# Patient Record
Sex: Female | Born: 1935
Health system: Southern US, Community
[De-identification: ages and names within clinical notes are randomized; demographics above are authoritative.]

## PROBLEM LIST (undated history)

## (undated) DIAGNOSIS — I10 Essential (primary) hypertension: Secondary | ICD-10-CM

## (undated) DIAGNOSIS — I4891 Unspecified atrial fibrillation: Secondary | ICD-10-CM

## (undated) DIAGNOSIS — I509 Heart failure, unspecified: Secondary | ICD-10-CM

## (undated) DIAGNOSIS — E785 Hyperlipidemia, unspecified: Secondary | ICD-10-CM

## (undated) DIAGNOSIS — E119 Type 2 diabetes mellitus without complications: Secondary | ICD-10-CM

## (undated) HISTORY — DX: Heart failure, unspecified: I50.9

## (undated) HISTORY — DX: Type 2 diabetes mellitus without complications: E11.9

## (undated) HISTORY — PX: PARTIAL HYSTERECTOMY: SHX80

## (undated) HISTORY — PX: VESICOVAGINAL FISTULA CLOSURE W/ TAH: SUR271

## (undated) HISTORY — DX: Hyperlipidemia, unspecified: E78.5

## (undated) HISTORY — DX: Essential (primary) hypertension: I10

## (undated) HISTORY — PX: REPLACEMENT TOTAL KNEE: SUR1224

## (undated) HISTORY — PX: BACK SURGERY: SHX140

## (undated) HISTORY — PX: APPENDECTOMY: SHX54

---

## 1998-10-06 ENCOUNTER — Other Ambulatory Visit: Admission: RE | Admit: 1998-10-06 | Discharge: 1998-10-06 | Payer: Self-pay | Admitting: Gynecology

## 1999-01-30 ENCOUNTER — Encounter: Payer: Self-pay | Admitting: Gynecology

## 1999-01-30 ENCOUNTER — Encounter: Admission: RE | Admit: 1999-01-30 | Discharge: 1999-01-30 | Payer: Self-pay | Admitting: Gynecology

## 1999-09-25 ENCOUNTER — Other Ambulatory Visit: Admission: RE | Admit: 1999-09-25 | Discharge: 1999-09-25 | Payer: Self-pay | Admitting: Gynecology

## 1999-10-26 ENCOUNTER — Encounter: Admission: RE | Admit: 1999-10-26 | Discharge: 1999-10-26 | Payer: Self-pay | Admitting: Gynecology

## 1999-10-26 ENCOUNTER — Encounter: Payer: Self-pay | Admitting: Gynecology

## 1999-10-30 ENCOUNTER — Encounter (INDEPENDENT_AMBULATORY_CARE_PROVIDER_SITE_OTHER): Payer: Self-pay | Admitting: Specialist

## 1999-10-30 ENCOUNTER — Ambulatory Visit (HOSPITAL_COMMUNITY): Admission: RE | Admit: 1999-10-30 | Discharge: 1999-10-30 | Payer: Self-pay | Admitting: Gastroenterology

## 1999-11-01 ENCOUNTER — Inpatient Hospital Stay (HOSPITAL_COMMUNITY): Admission: EM | Admit: 1999-11-01 | Discharge: 1999-11-03 | Payer: Self-pay | Admitting: Emergency Medicine

## 2000-06-11 ENCOUNTER — Encounter: Admission: RE | Admit: 2000-06-11 | Discharge: 2000-06-11 | Payer: Self-pay | Admitting: Orthopedic Surgery

## 2000-06-11 ENCOUNTER — Encounter: Payer: Self-pay | Admitting: Orthopedic Surgery

## 2000-09-25 ENCOUNTER — Other Ambulatory Visit: Admission: RE | Admit: 2000-09-25 | Discharge: 2000-09-25 | Payer: Self-pay | Admitting: Gynecology

## 2000-09-26 ENCOUNTER — Encounter: Payer: Self-pay | Admitting: Gynecology

## 2000-09-26 ENCOUNTER — Encounter: Admission: RE | Admit: 2000-09-26 | Discharge: 2000-09-26 | Payer: Self-pay | Admitting: Gynecology

## 2001-09-15 ENCOUNTER — Encounter: Admission: RE | Admit: 2001-09-15 | Discharge: 2001-09-15 | Payer: Self-pay | Admitting: Gynecology

## 2001-09-15 ENCOUNTER — Encounter: Payer: Self-pay | Admitting: Gynecology

## 2001-10-06 ENCOUNTER — Other Ambulatory Visit: Admission: RE | Admit: 2001-10-06 | Discharge: 2001-10-06 | Payer: Self-pay | Admitting: Gynecology

## 2001-10-10 ENCOUNTER — Encounter: Payer: Self-pay | Admitting: Gynecology

## 2001-10-10 ENCOUNTER — Encounter: Admission: RE | Admit: 2001-10-10 | Discharge: 2001-10-10 | Payer: Self-pay | Admitting: Gynecology

## 2002-09-21 ENCOUNTER — Encounter: Admission: RE | Admit: 2002-09-21 | Discharge: 2002-09-21 | Payer: Self-pay | Admitting: Gynecology

## 2002-09-21 ENCOUNTER — Encounter: Payer: Self-pay | Admitting: Gynecology

## 2003-05-20 ENCOUNTER — Ambulatory Visit (HOSPITAL_COMMUNITY): Admission: RE | Admit: 2003-05-20 | Discharge: 2003-05-20 | Payer: Self-pay | Admitting: *Deleted

## 2003-09-24 ENCOUNTER — Encounter: Admission: RE | Admit: 2003-09-24 | Discharge: 2003-09-24 | Payer: Self-pay | Admitting: Gynecology

## 2003-10-08 ENCOUNTER — Other Ambulatory Visit: Admission: RE | Admit: 2003-10-08 | Discharge: 2003-10-08 | Payer: Self-pay | Admitting: Gynecology

## 2004-10-06 ENCOUNTER — Encounter: Admission: RE | Admit: 2004-10-06 | Discharge: 2004-10-06 | Payer: Self-pay | Admitting: Gynecology

## 2005-10-08 ENCOUNTER — Encounter: Admission: RE | Admit: 2005-10-08 | Discharge: 2005-10-08 | Payer: Self-pay | Admitting: Gynecology

## 2005-10-18 ENCOUNTER — Other Ambulatory Visit: Admission: RE | Admit: 2005-10-18 | Discharge: 2005-10-18 | Payer: Self-pay | Admitting: Gynecology

## 2006-10-10 ENCOUNTER — Encounter: Admission: RE | Admit: 2006-10-10 | Discharge: 2006-10-10 | Payer: Self-pay | Admitting: Gynecology

## 2007-10-13 ENCOUNTER — Encounter: Admission: RE | Admit: 2007-10-13 | Discharge: 2007-10-13 | Payer: Self-pay | Admitting: Gynecology

## 2008-10-13 ENCOUNTER — Encounter: Admission: RE | Admit: 2008-10-13 | Discharge: 2008-10-13 | Payer: Self-pay | Admitting: Gynecology

## 2009-10-18 ENCOUNTER — Encounter: Admission: RE | Admit: 2009-10-18 | Discharge: 2009-10-18 | Payer: Self-pay | Admitting: Gynecology

## 2010-06-30 NOTE — Procedures (Signed)
[facility]  Patient:    Janet Owen, Janet Owen                    MRN: [id number] Proc. Date: 10/30/99 Adm. Date:  [id number] Disc. Date: [id number] Attending:  Sherrin Daisy CC:         [place] Geni Bers, M.D.  Selinda Orion, M.D.   Procedure Report  PROCEDURE:  Colonoscopy and polypectomy.  MEDICATIONS:  Fentanyl 75 mcg, Versed 7 mg IV.  INSTRUMENT USED:  Scope:  An adult video colonoscope.  INDICATIONS:  Heme-positive stools in a woman with a family history of colon polyps.  DESCRIPTION OF PROCEDURE:  The procedure had been explained to the patient and consent obtained.  With the patient in the left lateral decubitus position, the adult video colonoscope was inserted and advanced under direct visualization.  The prep was quite good.  She had a very tortuous sigmoid colon.  It took some time and several maneuvers including abdominal pressure, etc., to pass this.  We were able to advance this beyond the sigmoid and fairly rapidly to the hepatic flexure down to the cecum, ileocecal valve, and appendicial orifice were identified.  Right across from the ileocecal valve was a 1 cm sessile polyp that was removed in two pieces with the snare.  One piece was quite large and encompassed about 95% of the polyp.  The other was a small remnant that had been left from the original polypectomy and was sucked through the scope.  The large piece was grabbed with a tripod retriever and the scope withdrawn and the colon examined upon removal.  No other polyps or tumors seen.  Again diverticular disease in the sigmoid with marked tortuosity.  The scope was withdrawn, the polyp recovered.  She tolerated the procedure well and was maintained on low-flow oxygen, pulse oximetry, and cardiac monitor throughout the procedure.  ASSESSMENT: 1. Ascending colon polyp removed. 2. Sigmoid diverticulosis.  PLAN:  We will check pathology and recommend repeating in three  years. Routine post polypectomy instructions. DD:  10/30/99 TD:  11/01/99 Job: [id number]

## 2010-06-30 NOTE — Discharge Summary (Signed)
[place]  Patient:    Janet Owen, Janet Owen                    MRN: [id number] Adm. Date:  [id number] Disc. Date: [id number] Attending:  Sherrin Daisy CC:         [place] Geni Bers, M.D.  Selinda Orion, M.D.   Discharge Summary  ADMISSION DIAGNOSIS:  Gastrointestinal bleeding secondary to colonoscopic polypectomy.  FINAL DIAGNOSIS:  Gastrointestinal bleeding secondary to colonoscopic polypectomy.  PROCEDURES:  None.  HISTORY OF PRESENT ILLNESS:  Patient is a 77 year old woman evaluated for heme-positive stool underwent a colonoscopy two days prior to admission with a polypectomy of a 1.5 cm villous adenoma in the cecum opposite the ileocecal valve did fine for the first 48 hours then began to have multiple red watery bowel movements and this morning having abdominal pain.  Presented to the emergency room with these symptoms with a blood pressure of 147/77, pulse 88, hemoglobin of 12.3.  Remainder of physical exam was remarkable for normal heart and lung exam.  Abdomen was soft and nontender.  Rectal revealed a maroonish dark red stool.  For more details, please see dictated admission history and physical.  HOSPITAL COURSE:  Patient was admitted to a medical floor, IV fluids were started.  She felt much better with fluids.  Her hemoglobin was 12.3 on admission dropping down to 10.4.  She had no obvious signs of any further bleeding although her stools remained dark.  She was given some milk of magnesia.  On the day of November 03, 1999, her hemoglobin had dropped from 11.2 to 10.6 but with no signs of bleeding.  It was felt that she was in satisfactory condition for discharge.  DISPOSITION:  The patient was discharged home to continue all of her home medications.  She was instructed to remain off all aspirin and ibuprofen for five days.  She will call to report to see how she is doing Monday following her discharge and will call over  the weekend if there is any further bleeding. D:  11/21/99 TD:  11/23/99 Job: [id number]

## 2010-06-30 NOTE — H&P (Signed)
[place]  Patient:    Janet Owen, Janet Owen                    MRN: [id number] Adm. Date:  [id number] Attending:  Sherrin Daisy CC:         [place] Geni Bers, M.D.             Selinda Orion, M.D.             James L. Oletta Lamas, M.D.                         History and Physical  HISTORY OF PRESENT ILLNESS:  Janet Owen is a 77 year old female originally referred to my partner, Dr. Laurence Spates, for guaiac positive stool. She underwent colonoscopy on October 30, 1999 with polypectomy of a 1.2 cm ascending colon polyp opposite the ileocecal valve. This was revealed to be a tubulovillous adenoma on histology. Yesterday she felt fine and was very active. Today, this evening, she began having multiple red watery bowel movements with lower abdominal cramping. She has counted at least six episodes of filling the toilet with blood. She presented to the emergency room where she was not orthostatic, but blood pressure was 147/77 with a pulse of 88 and hemoglobin was 12.3 before hydration. Prothrombin time was 12.6 for an INR of 1. She denies other gastrointestinal problems. Her colonoscopy noted diverticular disease of the sigmoid, as well.  PAST MEDICAL HISTORY: 1. Hypertriglyceridemia. 2. Hypercholesterolemia. 3. Recently diagnosed diabetes type 2.  CURRENT MEDICATIONS: 1. Actos. 2. Lipitor. 3. Premarin.  ALLERGIES:  None reported.  FAMILY HISTORY:  Pertinent in that her mother had a colostomy for colonic polyps. There are other family members who had gallstones and ulcers, as well.  SOCIAL HISTORY:  Married, nonsmoker, nondrinker.  REVIEW OF SYSTEMS:  GENERAL:  No weight loss or night sweats. ENDOCRINE: Positive for early diabetes type 2. No history of thyroid problems. SKIN:  No rash or pruritus. EYES: No icterus or change in vision. ENT:  No aphthous ulcers or chronic sore throat. RESPIRATORY:  No shortness of breath, cough  or wheezing. CARDIAC:  No chest pain, palpitations or history of valvular heart disease. GASTROINTESTINAL:  As above. GENITOURINARY:  No dysuria or hematuria. The remainder of the review of systems is negative.  PHYSICAL EXAMINATION:  GENERAL:  A well-developed, well-nourished female in no acute distress.  VITAL SIGNS:  At the time of my exam she had a blood pressure of 129/72 and a pulse of 72, which was regular.  SKIN:  Normal.  EYES:  Anicteric.  ORAL PHARYNX:  Unremarkable.  NECK:  Supple without thyromegaly.  LYMPH:  No cervical or inguinal adenopathy.  CHEST:  Clear.  HEART:  Sounds are regular rate and rhythm without murmurs, rubs or gallops.  ABDOMEN:  Soft without mass or organomegaly. There was minimal lower quadrant tenderness bilaterally.  RECTAL:  Stool was observed to be dark red.  EXTREMITIES:  Without cyanosis, clubbing or edema. Dorsalis pedis pulses 2+ bilaterally.  LABORATORY DATA:  White blood count 11, hemoglobin 12.3, platelet count 214. Electrolytes were within normal limits. Glucose 142, BUN 22, creatinine 1.  IMPRESSION:  A 78 year old female with post polypectomy bleeding. This is likely related to the ascending colon polyp resected two days ago; however, another possibility would be a diverticular bleed, which is unrelated to the above, but this latter is doubtful.  PLAN:  She will be  observed in the hospital on clear fluids. Serial hemoglobins will be checked and she will be transfused if needed. If the bleeding persists colonoscopy will be in order to cauterize the polyp base. Dr. Oletta Lamas will be notified of the admission. Please see the admission orders. DD:  11/01/99 TD:  11/02/99 Job: [id number]

## 2010-09-18 ENCOUNTER — Other Ambulatory Visit: Payer: Self-pay | Admitting: Gynecology

## 2010-09-18 DIAGNOSIS — Z1231 Encounter for screening mammogram for malignant neoplasm of breast: Secondary | ICD-10-CM

## 2010-10-20 ENCOUNTER — Ambulatory Visit: Payer: Self-pay

## 2010-10-20 ENCOUNTER — Ambulatory Visit
Admission: RE | Admit: 2010-10-20 | Discharge: 2010-10-20 | Disposition: A | Discharge status: Home / Self Care | Payer: BLUE CROSS/BLUE SHIELD | Source: Ambulatory Visit | Attending: Gynecology | Admitting: Gynecology

## 2010-10-20 DIAGNOSIS — Z1231 Encounter for screening mammogram for malignant neoplasm of breast: Secondary | ICD-10-CM

## 2011-06-11 DIAGNOSIS — H2510 Age-related nuclear cataract, unspecified eye: Secondary | ICD-10-CM | POA: Diagnosis not present

## 2011-06-11 DIAGNOSIS — E119 Type 2 diabetes mellitus without complications: Secondary | ICD-10-CM | POA: Diagnosis not present

## 2011-06-11 DIAGNOSIS — H26019 Infantile and juvenile cortical, lamellar, or zonular cataract, unspecified eye: Secondary | ICD-10-CM | POA: Diagnosis not present

## 2011-06-20 DIAGNOSIS — H2510 Age-related nuclear cataract, unspecified eye: Secondary | ICD-10-CM | POA: Diagnosis not present

## 2011-06-27 DIAGNOSIS — H2510 Age-related nuclear cataract, unspecified eye: Secondary | ICD-10-CM | POA: Diagnosis not present

## 2011-07-04 DIAGNOSIS — H2510 Age-related nuclear cataract, unspecified eye: Secondary | ICD-10-CM | POA: Diagnosis not present

## 2011-09-10 ENCOUNTER — Other Ambulatory Visit: Payer: Self-pay | Admitting: Gynecology

## 2011-09-10 DIAGNOSIS — Z1231 Encounter for screening mammogram for malignant neoplasm of breast: Secondary | ICD-10-CM

## 2011-10-10 DIAGNOSIS — E785 Hyperlipidemia, unspecified: Secondary | ICD-10-CM | POA: Diagnosis not present

## 2011-10-10 DIAGNOSIS — E119 Type 2 diabetes mellitus without complications: Secondary | ICD-10-CM | POA: Diagnosis not present

## 2011-10-10 DIAGNOSIS — E559 Vitamin D deficiency, unspecified: Secondary | ICD-10-CM | POA: Diagnosis not present

## 2011-10-17 DIAGNOSIS — Z23 Encounter for immunization: Secondary | ICD-10-CM | POA: Diagnosis not present

## 2011-10-17 DIAGNOSIS — E785 Hyperlipidemia, unspecified: Secondary | ICD-10-CM | POA: Diagnosis not present

## 2011-10-17 DIAGNOSIS — E559 Vitamin D deficiency, unspecified: Secondary | ICD-10-CM | POA: Diagnosis not present

## 2011-10-17 DIAGNOSIS — E119 Type 2 diabetes mellitus without complications: Secondary | ICD-10-CM | POA: Diagnosis not present

## 2011-10-17 DIAGNOSIS — I10 Essential (primary) hypertension: Secondary | ICD-10-CM | POA: Diagnosis not present

## 2011-10-22 ENCOUNTER — Ambulatory Visit
Admission: RE | Admit: 2011-10-22 | Discharge: 2011-10-22 | Disposition: A | Discharge status: Home / Self Care | Payer: BLUE CROSS/BLUE SHIELD | Source: Ambulatory Visit | Attending: Gynecology | Admitting: Gynecology

## 2011-10-22 DIAGNOSIS — Z1231 Encounter for screening mammogram for malignant neoplasm of breast: Secondary | ICD-10-CM | POA: Diagnosis not present

## 2011-10-29 DIAGNOSIS — Z961 Presence of intraocular lens: Secondary | ICD-10-CM | POA: Diagnosis not present

## 2011-11-12 DIAGNOSIS — N8184 Pelvic muscle wasting: Secondary | ICD-10-CM | POA: Diagnosis not present

## 2011-11-12 DIAGNOSIS — R350 Frequency of micturition: Secondary | ICD-10-CM | POA: Diagnosis not present

## 2011-11-12 DIAGNOSIS — Z01419 Encounter for gynecological examination (general) (routine) without abnormal findings: Secondary | ICD-10-CM | POA: Diagnosis not present

## 2012-05-19 DIAGNOSIS — I8390 Asymptomatic varicose veins of unspecified lower extremity: Secondary | ICD-10-CM | POA: Diagnosis not present

## 2012-05-19 DIAGNOSIS — E785 Hyperlipidemia, unspecified: Secondary | ICD-10-CM | POA: Diagnosis not present

## 2012-05-19 DIAGNOSIS — Z1331 Encounter for screening for depression: Secondary | ICD-10-CM | POA: Diagnosis not present

## 2012-05-19 DIAGNOSIS — I10 Essential (primary) hypertension: Secondary | ICD-10-CM | POA: Diagnosis not present

## 2012-05-19 DIAGNOSIS — E119 Type 2 diabetes mellitus without complications: Secondary | ICD-10-CM | POA: Diagnosis not present

## 2012-05-19 DIAGNOSIS — K921 Melena: Secondary | ICD-10-CM | POA: Diagnosis not present

## 2012-06-02 DIAGNOSIS — K625 Hemorrhage of anus and rectum: Secondary | ICD-10-CM | POA: Diagnosis not present

## 2012-07-03 DIAGNOSIS — K648 Other hemorrhoids: Secondary | ICD-10-CM | POA: Diagnosis not present

## 2012-07-03 DIAGNOSIS — K625 Hemorrhage of anus and rectum: Secondary | ICD-10-CM | POA: Diagnosis not present

## 2012-07-03 DIAGNOSIS — K5730 Diverticulosis of large intestine without perforation or abscess without bleeding: Secondary | ICD-10-CM | POA: Diagnosis not present

## 2012-07-03 DIAGNOSIS — Z86010 Personal history of colonic polyps: Secondary | ICD-10-CM | POA: Diagnosis not present

## 2012-07-14 DIAGNOSIS — E785 Hyperlipidemia, unspecified: Secondary | ICD-10-CM | POA: Diagnosis not present

## 2012-07-14 DIAGNOSIS — E559 Vitamin D deficiency, unspecified: Secondary | ICD-10-CM | POA: Diagnosis not present

## 2012-07-14 DIAGNOSIS — E119 Type 2 diabetes mellitus without complications: Secondary | ICD-10-CM | POA: Diagnosis not present

## 2012-07-21 DIAGNOSIS — I10 Essential (primary) hypertension: Secondary | ICD-10-CM | POA: Diagnosis not present

## 2012-07-21 DIAGNOSIS — E559 Vitamin D deficiency, unspecified: Secondary | ICD-10-CM | POA: Diagnosis not present

## 2012-07-21 DIAGNOSIS — E785 Hyperlipidemia, unspecified: Secondary | ICD-10-CM | POA: Diagnosis not present

## 2012-07-21 DIAGNOSIS — E119 Type 2 diabetes mellitus without complications: Secondary | ICD-10-CM | POA: Diagnosis not present

## 2012-07-29 DIAGNOSIS — D485 Neoplasm of uncertain behavior of skin: Secondary | ICD-10-CM | POA: Diagnosis not present

## 2012-07-29 DIAGNOSIS — L820 Inflamed seborrheic keratosis: Secondary | ICD-10-CM | POA: Diagnosis not present

## 2012-07-29 DIAGNOSIS — D235 Other benign neoplasm of skin of trunk: Secondary | ICD-10-CM | POA: Diagnosis not present

## 2012-07-29 DIAGNOSIS — L570 Actinic keratosis: Secondary | ICD-10-CM | POA: Diagnosis not present

## 2012-09-22 ENCOUNTER — Other Ambulatory Visit: Payer: Self-pay

## 2012-09-22 DIAGNOSIS — Z1231 Encounter for screening mammogram for malignant neoplasm of breast: Secondary | ICD-10-CM

## 2012-10-13 DIAGNOSIS — Z23 Encounter for immunization: Secondary | ICD-10-CM | POA: Diagnosis not present

## 2012-10-23 ENCOUNTER — Ambulatory Visit
Admission: RE | Admit: 2012-10-23 | Discharge: 2012-10-23 | Disposition: A | Discharge status: Home / Self Care | Payer: Medicare Other | Source: Ambulatory Visit

## 2012-10-23 DIAGNOSIS — Z1231 Encounter for screening mammogram for malignant neoplasm of breast: Secondary | ICD-10-CM | POA: Diagnosis not present

## 2012-10-27 DIAGNOSIS — E1169 Type 2 diabetes mellitus with other specified complication: Secondary | ICD-10-CM | POA: Diagnosis not present

## 2012-10-27 DIAGNOSIS — I10 Essential (primary) hypertension: Secondary | ICD-10-CM | POA: Diagnosis not present

## 2012-10-27 DIAGNOSIS — E785 Hyperlipidemia, unspecified: Secondary | ICD-10-CM | POA: Diagnosis not present

## 2012-11-10 DIAGNOSIS — I10 Essential (primary) hypertension: Secondary | ICD-10-CM | POA: Diagnosis not present

## 2012-11-10 DIAGNOSIS — N951 Menopausal and female climacteric states: Secondary | ICD-10-CM | POA: Diagnosis not present

## 2012-11-10 DIAGNOSIS — Z6827 Body mass index (BMI) 27.0-27.9, adult: Secondary | ICD-10-CM | POA: Diagnosis not present

## 2012-11-10 DIAGNOSIS — I447 Left bundle-branch block, unspecified: Secondary | ICD-10-CM | POA: Diagnosis not present

## 2012-11-10 DIAGNOSIS — E785 Hyperlipidemia, unspecified: Secondary | ICD-10-CM | POA: Diagnosis not present

## 2012-11-10 DIAGNOSIS — Z0000 Encounter for general adult medical examination without abnormal findings: Secondary | ICD-10-CM | POA: Diagnosis not present

## 2012-11-10 DIAGNOSIS — E1169 Type 2 diabetes mellitus with other specified complication: Secondary | ICD-10-CM | POA: Diagnosis not present

## 2012-11-10 DIAGNOSIS — Z1212 Encounter for screening for malignant neoplasm of rectum: Secondary | ICD-10-CM | POA: Diagnosis not present

## 2013-07-16 DIAGNOSIS — R1084 Generalized abdominal pain: Secondary | ICD-10-CM | POA: Diagnosis not present

## 2013-07-16 DIAGNOSIS — R143 Flatulence: Secondary | ICD-10-CM | POA: Diagnosis not present

## 2013-07-16 DIAGNOSIS — R141 Gas pain: Secondary | ICD-10-CM | POA: Diagnosis not present

## 2013-07-16 DIAGNOSIS — R142 Eructation: Secondary | ICD-10-CM | POA: Diagnosis not present

## 2013-07-17 DIAGNOSIS — I10 Essential (primary) hypertension: Secondary | ICD-10-CM | POA: Diagnosis not present

## 2013-07-17 DIAGNOSIS — E119 Type 2 diabetes mellitus without complications: Secondary | ICD-10-CM | POA: Diagnosis not present

## 2013-07-17 DIAGNOSIS — E559 Vitamin D deficiency, unspecified: Secondary | ICD-10-CM | POA: Diagnosis not present

## 2013-07-17 DIAGNOSIS — E785 Hyperlipidemia, unspecified: Secondary | ICD-10-CM | POA: Diagnosis not present

## 2013-07-24 DIAGNOSIS — E785 Hyperlipidemia, unspecified: Secondary | ICD-10-CM | POA: Diagnosis not present

## 2013-07-24 DIAGNOSIS — E559 Vitamin D deficiency, unspecified: Secondary | ICD-10-CM | POA: Diagnosis not present

## 2013-07-24 DIAGNOSIS — I10 Essential (primary) hypertension: Secondary | ICD-10-CM | POA: Diagnosis not present

## 2013-07-24 DIAGNOSIS — E119 Type 2 diabetes mellitus without complications: Secondary | ICD-10-CM | POA: Diagnosis not present

## 2013-09-22 ENCOUNTER — Other Ambulatory Visit: Payer: Self-pay

## 2013-09-22 DIAGNOSIS — Z1231 Encounter for screening mammogram for malignant neoplasm of breast: Secondary | ICD-10-CM

## 2013-10-16 DIAGNOSIS — Z23 Encounter for immunization: Secondary | ICD-10-CM | POA: Diagnosis not present

## 2013-10-26 ENCOUNTER — Ambulatory Visit
Admission: RE | Admit: 2013-10-26 | Discharge: 2013-10-26 | Disposition: A | Discharge status: Home / Self Care | Payer: Medicare Other | Source: Ambulatory Visit

## 2013-10-26 DIAGNOSIS — Z1231 Encounter for screening mammogram for malignant neoplasm of breast: Secondary | ICD-10-CM | POA: Diagnosis not present

## 2014-02-01 DIAGNOSIS — Z794 Long term (current) use of insulin: Secondary | ICD-10-CM | POA: Diagnosis not present

## 2014-02-01 DIAGNOSIS — I429 Cardiomyopathy, unspecified: Secondary | ICD-10-CM | POA: Diagnosis present

## 2014-02-01 DIAGNOSIS — I2510 Atherosclerotic heart disease of native coronary artery without angina pectoris: Secondary | ICD-10-CM | POA: Diagnosis present

## 2014-02-01 DIAGNOSIS — I480 Paroxysmal atrial fibrillation: Secondary | ICD-10-CM | POA: Diagnosis present

## 2014-02-01 DIAGNOSIS — I517 Cardiomegaly: Secondary | ICD-10-CM | POA: Diagnosis not present

## 2014-02-01 DIAGNOSIS — E119 Type 2 diabetes mellitus without complications: Secondary | ICD-10-CM | POA: Diagnosis not present

## 2014-02-01 DIAGNOSIS — I5021 Acute systolic (congestive) heart failure: Secondary | ICD-10-CM | POA: Diagnosis not present

## 2014-02-01 DIAGNOSIS — E785 Hyperlipidemia, unspecified: Secondary | ICD-10-CM | POA: Diagnosis not present

## 2014-02-01 DIAGNOSIS — Z96653 Presence of artificial knee joint, bilateral: Secondary | ICD-10-CM | POA: Diagnosis present

## 2014-02-01 DIAGNOSIS — I509 Heart failure, unspecified: Secondary | ICD-10-CM | POA: Diagnosis not present

## 2014-02-01 DIAGNOSIS — R590 Localized enlarged lymph nodes: Secondary | ICD-10-CM | POA: Diagnosis present

## 2014-02-01 DIAGNOSIS — J90 Pleural effusion, not elsewhere classified: Secondary | ICD-10-CM | POA: Diagnosis not present

## 2014-02-01 DIAGNOSIS — R0602 Shortness of breath: Secondary | ICD-10-CM | POA: Diagnosis not present

## 2014-02-01 DIAGNOSIS — I10 Essential (primary) hypertension: Secondary | ICD-10-CM | POA: Diagnosis not present

## 2014-02-01 DIAGNOSIS — I083 Combined rheumatic disorders of mitral, aortic and tricuspid valves: Secondary | ICD-10-CM | POA: Diagnosis not present

## 2014-02-01 DIAGNOSIS — E1165 Type 2 diabetes mellitus with hyperglycemia: Secondary | ICD-10-CM | POA: Diagnosis not present

## 2014-02-01 DIAGNOSIS — C3490 Malignant neoplasm of unspecified part of unspecified bronchus or lung: Secondary | ICD-10-CM | POA: Diagnosis not present

## 2014-02-01 DIAGNOSIS — R591 Generalized enlarged lymph nodes: Secondary | ICD-10-CM | POA: Diagnosis not present

## 2014-02-01 DIAGNOSIS — R079 Chest pain, unspecified: Secondary | ICD-10-CM | POA: Diagnosis not present

## 2014-02-01 DIAGNOSIS — J929 Pleural plaque without asbestos: Secondary | ICD-10-CM | POA: Diagnosis not present

## 2014-02-01 DIAGNOSIS — R599 Enlarged lymph nodes, unspecified: Secondary | ICD-10-CM | POA: Diagnosis not present

## 2014-02-01 DIAGNOSIS — R918 Other nonspecific abnormal finding of lung field: Secondary | ICD-10-CM | POA: Diagnosis not present

## 2014-02-01 DIAGNOSIS — I428 Other cardiomyopathies: Secondary | ICD-10-CM | POA: Diagnosis not present

## 2014-02-01 DIAGNOSIS — I87303 Chronic venous hypertension (idiopathic) without complications of bilateral lower extremity: Secondary | ICD-10-CM | POA: Diagnosis not present

## 2014-02-01 DIAGNOSIS — Z7982 Long term (current) use of aspirin: Secondary | ICD-10-CM | POA: Diagnosis not present

## 2014-02-01 DIAGNOSIS — R222 Localized swelling, mass and lump, trunk: Secondary | ICD-10-CM | POA: Diagnosis not present

## 2014-02-10 DIAGNOSIS — I480 Paroxysmal atrial fibrillation: Secondary | ICD-10-CM | POA: Diagnosis not present

## 2014-02-10 DIAGNOSIS — R001 Bradycardia, unspecified: Secondary | ICD-10-CM | POA: Diagnosis not present

## 2014-02-10 DIAGNOSIS — D381 Neoplasm of uncertain behavior of trachea, bronchus and lung: Secondary | ICD-10-CM | POA: Diagnosis not present

## 2014-02-10 DIAGNOSIS — I255 Ischemic cardiomyopathy: Secondary | ICD-10-CM | POA: Diagnosis not present

## 2014-02-10 DIAGNOSIS — I5021 Acute systolic (congestive) heart failure: Secondary | ICD-10-CM | POA: Diagnosis not present

## 2014-02-10 DIAGNOSIS — R0602 Shortness of breath: Secondary | ICD-10-CM | POA: Diagnosis not present

## 2014-02-10 DIAGNOSIS — I10 Essential (primary) hypertension: Secondary | ICD-10-CM | POA: Diagnosis not present

## 2014-02-25 DIAGNOSIS — C390 Malignant neoplasm of upper respiratory tract, part unspecified: Secondary | ICD-10-CM | POA: Diagnosis not present

## 2014-02-26 DIAGNOSIS — I5021 Acute systolic (congestive) heart failure: Secondary | ICD-10-CM | POA: Diagnosis not present

## 2014-03-03 DIAGNOSIS — I255 Ischemic cardiomyopathy: Secondary | ICD-10-CM | POA: Diagnosis not present

## 2014-03-03 DIAGNOSIS — I5022 Chronic systolic (congestive) heart failure: Secondary | ICD-10-CM | POA: Diagnosis not present

## 2014-03-03 DIAGNOSIS — E1165 Type 2 diabetes mellitus with hyperglycemia: Secondary | ICD-10-CM | POA: Diagnosis not present

## 2014-03-03 DIAGNOSIS — I10 Essential (primary) hypertension: Secondary | ICD-10-CM | POA: Diagnosis not present

## 2014-03-03 DIAGNOSIS — I480 Paroxysmal atrial fibrillation: Secondary | ICD-10-CM | POA: Diagnosis not present

## 2014-03-11 DIAGNOSIS — J8410 Pulmonary fibrosis, unspecified: Secondary | ICD-10-CM | POA: Diagnosis not present

## 2014-03-11 DIAGNOSIS — J90 Pleural effusion, not elsewhere classified: Secondary | ICD-10-CM | POA: Diagnosis not present

## 2014-03-15 DIAGNOSIS — I5031 Acute diastolic (congestive) heart failure: Secondary | ICD-10-CM | POA: Diagnosis not present

## 2014-03-15 DIAGNOSIS — R0600 Dyspnea, unspecified: Secondary | ICD-10-CM | POA: Diagnosis not present

## 2014-03-15 DIAGNOSIS — R911 Solitary pulmonary nodule: Secondary | ICD-10-CM | POA: Diagnosis not present

## 2014-03-15 DIAGNOSIS — J90 Pleural effusion, not elsewhere classified: Secondary | ICD-10-CM | POA: Diagnosis not present

## 2014-03-15 DIAGNOSIS — R599 Enlarged lymph nodes, unspecified: Secondary | ICD-10-CM | POA: Diagnosis not present

## 2014-04-12 DIAGNOSIS — E1165 Type 2 diabetes mellitus with hyperglycemia: Secondary | ICD-10-CM | POA: Diagnosis not present

## 2014-04-12 DIAGNOSIS — I5022 Chronic systolic (congestive) heart failure: Secondary | ICD-10-CM | POA: Diagnosis not present

## 2014-04-14 DIAGNOSIS — E785 Hyperlipidemia, unspecified: Secondary | ICD-10-CM | POA: Diagnosis not present

## 2014-04-14 DIAGNOSIS — I429 Cardiomyopathy, unspecified: Secondary | ICD-10-CM | POA: Diagnosis not present

## 2014-04-14 DIAGNOSIS — I10 Essential (primary) hypertension: Secondary | ICD-10-CM | POA: Diagnosis not present

## 2014-04-14 DIAGNOSIS — I5023 Acute on chronic systolic (congestive) heart failure: Secondary | ICD-10-CM | POA: Diagnosis not present

## 2014-04-21 DIAGNOSIS — D381 Neoplasm of uncertain behavior of trachea, bronchus and lung: Secondary | ICD-10-CM | POA: Diagnosis not present

## 2014-04-21 DIAGNOSIS — I480 Paroxysmal atrial fibrillation: Secondary | ICD-10-CM | POA: Diagnosis not present

## 2014-04-21 DIAGNOSIS — I5022 Chronic systolic (congestive) heart failure: Secondary | ICD-10-CM | POA: Diagnosis not present

## 2014-04-21 DIAGNOSIS — E1165 Type 2 diabetes mellitus with hyperglycemia: Secondary | ICD-10-CM | POA: Diagnosis not present

## 2014-04-21 DIAGNOSIS — I255 Ischemic cardiomyopathy: Secondary | ICD-10-CM | POA: Diagnosis not present

## 2014-04-21 DIAGNOSIS — K641 Second degree hemorrhoids: Secondary | ICD-10-CM | POA: Diagnosis not present

## 2014-06-03 DIAGNOSIS — M545 Low back pain: Secondary | ICD-10-CM | POA: Diagnosis not present

## 2014-06-04 DIAGNOSIS — M545 Low back pain: Secondary | ICD-10-CM | POA: Diagnosis not present

## 2014-06-07 DIAGNOSIS — M545 Low back pain: Secondary | ICD-10-CM | POA: Diagnosis not present

## 2014-06-11 DIAGNOSIS — M545 Low back pain: Secondary | ICD-10-CM | POA: Diagnosis not present

## 2014-06-14 DIAGNOSIS — M545 Low back pain: Secondary | ICD-10-CM | POA: Diagnosis not present

## 2014-07-01 DIAGNOSIS — M5136 Other intervertebral disc degeneration, lumbar region: Secondary | ICD-10-CM | POA: Diagnosis not present

## 2014-07-01 DIAGNOSIS — M5416 Radiculopathy, lumbar region: Secondary | ICD-10-CM | POA: Diagnosis not present

## 2014-07-09 DIAGNOSIS — M5416 Radiculopathy, lumbar region: Secondary | ICD-10-CM | POA: Diagnosis not present

## 2014-07-15 DIAGNOSIS — M5416 Radiculopathy, lumbar region: Secondary | ICD-10-CM | POA: Diagnosis not present

## 2014-07-15 DIAGNOSIS — M5136 Other intervertebral disc degeneration, lumbar region: Secondary | ICD-10-CM | POA: Diagnosis not present

## 2014-07-27 DIAGNOSIS — E784 Other hyperlipidemia: Secondary | ICD-10-CM | POA: Diagnosis not present

## 2014-07-27 DIAGNOSIS — E559 Vitamin D deficiency, unspecified: Secondary | ICD-10-CM | POA: Diagnosis not present

## 2014-07-27 DIAGNOSIS — E119 Type 2 diabetes mellitus without complications: Secondary | ICD-10-CM | POA: Diagnosis not present

## 2014-08-02 DIAGNOSIS — E782 Mixed hyperlipidemia: Secondary | ICD-10-CM | POA: Diagnosis not present

## 2014-08-02 DIAGNOSIS — E559 Vitamin D deficiency, unspecified: Secondary | ICD-10-CM | POA: Diagnosis not present

## 2014-08-02 DIAGNOSIS — I10 Essential (primary) hypertension: Secondary | ICD-10-CM | POA: Diagnosis not present

## 2014-08-02 DIAGNOSIS — E784 Other hyperlipidemia: Secondary | ICD-10-CM | POA: Diagnosis not present

## 2014-08-02 DIAGNOSIS — E119 Type 2 diabetes mellitus without complications: Secondary | ICD-10-CM | POA: Diagnosis not present

## 2014-08-04 DIAGNOSIS — S80811A Abrasion, right lower leg, initial encounter: Secondary | ICD-10-CM | POA: Diagnosis not present

## 2014-08-09 DIAGNOSIS — Z681 Body mass index (BMI) 19 or less, adult: Secondary | ICD-10-CM | POA: Diagnosis not present

## 2014-08-09 DIAGNOSIS — E119 Type 2 diabetes mellitus without complications: Secondary | ICD-10-CM | POA: Insufficient documentation

## 2014-08-09 DIAGNOSIS — Z7189 Other specified counseling: Secondary | ICD-10-CM | POA: Diagnosis not present

## 2014-08-09 DIAGNOSIS — I5022 Chronic systolic (congestive) heart failure: Secondary | ICD-10-CM | POA: Diagnosis not present

## 2014-08-09 DIAGNOSIS — R911 Solitary pulmonary nodule: Secondary | ICD-10-CM | POA: Diagnosis not present

## 2014-08-09 DIAGNOSIS — Z7689 Persons encountering health services in other specified circumstances: Secondary | ICD-10-CM | POA: Insufficient documentation

## 2014-08-18 DIAGNOSIS — I480 Paroxysmal atrial fibrillation: Secondary | ICD-10-CM | POA: Diagnosis not present

## 2014-08-18 DIAGNOSIS — I5042 Chronic combined systolic (congestive) and diastolic (congestive) heart failure: Secondary | ICD-10-CM | POA: Diagnosis not present

## 2014-08-18 DIAGNOSIS — I429 Cardiomyopathy, unspecified: Secondary | ICD-10-CM | POA: Diagnosis not present

## 2014-08-18 DIAGNOSIS — R0602 Shortness of breath: Secondary | ICD-10-CM | POA: Diagnosis not present

## 2014-08-20 ENCOUNTER — Encounter: Payer: Self-pay | Admitting: Internal Medicine

## 2014-08-20 ENCOUNTER — Ambulatory Visit (INDEPENDENT_AMBULATORY_CARE_PROVIDER_SITE_OTHER): Payer: Medicare Other | Admitting: Internal Medicine

## 2014-08-20 VITALS — BP 120/80 | HR 55 | Ht 67.0 in | Wt 169.2 lb

## 2014-08-20 DIAGNOSIS — R938 Abnormal findings on diagnostic imaging of other specified body structures: Secondary | ICD-10-CM | POA: Diagnosis not present

## 2014-08-20 DIAGNOSIS — R9389 Abnormal findings on diagnostic imaging of other specified body structures: Secondary | ICD-10-CM | POA: Insufficient documentation

## 2014-08-20 DIAGNOSIS — I10 Essential (primary) hypertension: Secondary | ICD-10-CM | POA: Insufficient documentation

## 2014-08-20 DIAGNOSIS — R0600 Dyspnea, unspecified: Secondary | ICD-10-CM | POA: Diagnosis not present

## 2014-08-20 MED ORDER — VALSARTAN 160 MG PO TABS
160.0000 mg | ORAL_TABLET | Freq: Every day | ORAL | Status: DC
Start: 2014-08-20 — End: 2015-09-08

## 2014-08-20 NOTE — Assessment & Plan Note (Signed)
-   never smoker - CT chest 02/01/14 > 17 mm perihilar density with neg PET > no further w/u indicated    Discussed in detail all the  indications, usual  risks and alternatives  relative to the benefits with patient who does not appear enthusiastic about "proving a negative" esp since has already undergone extensive studies in [place] which in my opinion do not need to pursued absent  New pulmonary symptoms.

## 2014-08-20 NOTE — Assessment & Plan Note (Addendum)
-   nl LHC/RHC 01/23/14 [place] fl - PFT's 03/15/14 wnl including f/v loop and dlco  Symptoms are markedly disproportionate to objective findings and not clear this is a lung problem but pt does appear to have difficult airway management issues. DDX of  difficult airways management all start with A and  include Adherence, Ace Inhibitors, Acid Reflux, Active Sinus Disease, Alpha 1 Antitripsin deficiency, Anxiety masquerading as Airways dz,  ABPA,  allergy(esp in young), Aspiration (esp in elderly), Adverse effects of meds,  Active smokers, A bunch of PE's (a small clot burden can't cause this syndrome unless there is already severe underlying pulm or vascular dz with poor reserve) plus two Bs  = Bronchiectasis and Beta blocker use..and one C= CHF  ACEi at top of the usual list of suspects as she had a new chronic low grade cough prior to her acute decline which was attributed to chf with a low ef by ehco but don't see anything on RHC/ LHC that supports a non-ischemic cardiomypathy, which was the working dx   Only way to know re ACEi case is stop it > see hbp  We can certainly consider the remainder of the list for further w/u if not better to her satisfaction in 6 weeks but she's already had a very thorough eval in [place]   Total discussion time  = 61m:  Reviewed entire case with pt/  alternative dx's/ counseling/ giving and going over instructions (see avs)

## 2014-08-20 NOTE — Patient Instructions (Addendum)
Valsartan 160 mg one daily each am and  Your cough and breathing should improve to your satisfaction or you return here after 6 weeks - let me know in meantime if your insurance prefers another alternative    If you are satisfied with your treatment plan,  let your doctor know and he/she can either refill your medications or you can return here when your prescription runs out.     If in any way you are not 100% satisfied,  please tell us.  If 100% better, tell your friends!  Pulmonary follow up is ONLY as needed

## 2014-08-20 NOTE — Progress Notes (Signed)
Subjective:    Patient ID: Janet Owen, female    DOB: Jul 01, 1933,   MRN: [id number]  HPI  50 yowf never smoker with poor ex tol x 2013 attributed just to heat exposure but new sob/leg swelling x 10 days in Dec 2015 in [place] not in hot weather > admitted to [facility]> diuresis > back to nl until tried to play golf in heat / referred 08/20/2014  to pulmonary clinic by Precious Haws    08/20/2014 1st [facility] office visit/ [name]   Chief Complaint  Patient presents with  . Pulmonary Consult    Referred Tammy Boyd. Pt c/o SOB "in the heat when I play golf" for the past 2-3 yrs.    notes cough at night x years p starting altace but didn't think much about it, doesn't really bother sleep "but I assumed it was the bp medication and it wasn't that bad"  Back to baseline since admit with dx of new chf though note her lvedp was only 6 (probably p diuresis) and cxr did not show classic pulmonary edema/ no bnp in records.  Tried to play golf again one day prior to OV  And could not tolerate the heat but does fine walking flat in stores/malls  No obvious day to day or daytime variabilty or assoc chronic cough or cp or chest tightness, subjective wheeze overt sinus or hb symptoms. No unusual exp hx or h/o childhood pna/ asthma or knowledge of premature birth.  Sleeping ok without nocturnal  or early am exacerbation  of respiratory  c/o's or need for noct saba. Also denies any obvious fluctuation of symptoms with weather or environmental changes or other aggravating or alleviating factors except as outlined above   Current Medications, Allergies, Complete Past Medical History, Past Surgical History, Family History, and Social History were reviewed in [employer] record.     Review of Systems  Constitutional: Negative for fever, chills and unexpected weight change.  HENT: Negative for congestion, dental problem, ear pain, nosebleeds, postnasal drip,  rhinorrhea, sinus pressure, sneezing, sore throat, trouble swallowing and voice change.   Eyes: Negative for visual disturbance.  Respiratory: Positive for shortness of breath. Negative for cough and choking.   Cardiovascular: Negative for chest pain and leg swelling.  Gastrointestinal: Negative for vomiting, abdominal pain and diarrhea.  Genitourinary: Negative for difficulty urinating.  Musculoskeletal: Negative for arthralgias.  Skin: Negative for rash.  Neurological: Negative for tremors, syncope and headaches.  Hematological: Does not bruise/bleed easily.       Objective:   Physical Exam  Wt Readings from Last 3 Encounters:  08/20/14 169 lb 3.2 oz (76.749 kg)    Vital signs reviewed   HEENT: nl dentition, turbinates, and orophanx. Nl external ear canals without cough reflex   NECK :  without JVD/Nodes/TM/ nl carotid upstrokes bilaterally   LUNGS: no acc muscle use, clear to A and P bilaterally without cough on insp or exp maneuvers   CV:  RRR  no s3 or murmur or increase in P2, no edema   ABD:  soft and nontender with nl excursion in the supine position. No bruits or organomegaly, bowel sounds nl  MS:  warm without deformities, calf tenderness, cyanosis or clubbing  SKIN: warm and dry without lesions    NEURO:  alert, approp, no deficits     CT a Chest 02/01/14 neg PE, R Hilar node x 17 mm with neg PET / small lateral hernia with  GE diverticulum  PA mean 22, pcwp 6 CO 5.6 and nl coronaries/ LVEDP 5     Assessment & Plan:

## 2014-08-20 NOTE — Assessment & Plan Note (Signed)
Trial acei 08/20/2014 due to cough and unexplained sob   ACE inhibitors are problematic in  pts with airway complaints because  even experienced pulmonologists can't always distinguish ace effects from copd/asthma.  By themselves they don't actually cause a problem, much like oxygen can't by itself start a fire, but they certainly serve as a powerful catalyst or enhancer for any "fire"  or inflammatory process in the upper airway, be it caused by an ET  tube or more commonly reflux (especially in the obese or pts with known GERD or who are on biphoshonates).    In the era of ARB near equivalency until we have a better handle on the reversibility of the airway problem, it just makes sense to avoid ACEI  entirely in the short run and then decide later, having established a level of airway control using a reasonable limited regimen, whether to add back ace but even then being very careful to observe the pt for worsening airway control and number of meds used/ needed to control symptoms.    rec trial of diovan 160 mg daily and f/u with Dr Nadyne Coombes planned

## 2014-09-02 DIAGNOSIS — Z961 Presence of intraocular lens: Secondary | ICD-10-CM | POA: Diagnosis not present

## 2014-09-02 DIAGNOSIS — E119 Type 2 diabetes mellitus without complications: Secondary | ICD-10-CM | POA: Diagnosis not present

## 2014-09-02 DIAGNOSIS — H04123 Dry eye syndrome of bilateral lacrimal glands: Secondary | ICD-10-CM | POA: Diagnosis not present

## 2014-09-09 DIAGNOSIS — R0602 Shortness of breath: Secondary | ICD-10-CM | POA: Diagnosis not present

## 2014-09-09 DIAGNOSIS — I429 Cardiomyopathy, unspecified: Secondary | ICD-10-CM | POA: Diagnosis not present

## 2014-09-20 DIAGNOSIS — I447 Left bundle-branch block, unspecified: Secondary | ICD-10-CM | POA: Diagnosis not present

## 2014-09-20 DIAGNOSIS — I429 Cardiomyopathy, unspecified: Secondary | ICD-10-CM | POA: Diagnosis not present

## 2014-09-20 DIAGNOSIS — I5042 Chronic combined systolic (congestive) and diastolic (congestive) heart failure: Secondary | ICD-10-CM | POA: Diagnosis not present

## 2014-09-20 DIAGNOSIS — I480 Paroxysmal atrial fibrillation: Secondary | ICD-10-CM | POA: Diagnosis not present

## 2014-09-24 ENCOUNTER — Other Ambulatory Visit: Payer: Self-pay

## 2014-09-24 DIAGNOSIS — Z1231 Encounter for screening mammogram for malignant neoplasm of breast: Secondary | ICD-10-CM

## 2014-09-30 DIAGNOSIS — I480 Paroxysmal atrial fibrillation: Secondary | ICD-10-CM | POA: Diagnosis not present

## 2014-10-14 DIAGNOSIS — I10 Essential (primary) hypertension: Secondary | ICD-10-CM | POA: Diagnosis not present

## 2014-10-28 ENCOUNTER — Ambulatory Visit
Admission: RE | Admit: 2014-10-28 | Discharge: 2014-10-28 | Disposition: A | Discharge status: Home / Self Care | Payer: Medicare Other | Source: Ambulatory Visit

## 2014-10-28 DIAGNOSIS — Z23 Encounter for immunization: Secondary | ICD-10-CM | POA: Diagnosis not present

## 2014-10-28 DIAGNOSIS — Z1231 Encounter for screening mammogram for malignant neoplasm of breast: Secondary | ICD-10-CM | POA: Diagnosis not present

## 2014-10-28 DIAGNOSIS — E559 Vitamin D deficiency, unspecified: Secondary | ICD-10-CM | POA: Diagnosis not present

## 2014-10-28 DIAGNOSIS — E784 Other hyperlipidemia: Secondary | ICD-10-CM | POA: Diagnosis not present

## 2014-10-28 DIAGNOSIS — E119 Type 2 diabetes mellitus without complications: Secondary | ICD-10-CM | POA: Diagnosis not present

## 2014-11-01 DIAGNOSIS — E119 Type 2 diabetes mellitus without complications: Secondary | ICD-10-CM | POA: Diagnosis not present

## 2014-11-01 DIAGNOSIS — E559 Vitamin D deficiency, unspecified: Secondary | ICD-10-CM | POA: Diagnosis not present

## 2014-11-01 DIAGNOSIS — E782 Mixed hyperlipidemia: Secondary | ICD-10-CM | POA: Diagnosis not present

## 2014-11-01 DIAGNOSIS — E784 Other hyperlipidemia: Secondary | ICD-10-CM | POA: Diagnosis not present

## 2014-11-01 DIAGNOSIS — I10 Essential (primary) hypertension: Secondary | ICD-10-CM | POA: Diagnosis not present

## 2014-11-04 DIAGNOSIS — Z23 Encounter for immunization: Secondary | ICD-10-CM | POA: Diagnosis not present

## 2015-05-31 DIAGNOSIS — I10 Essential (primary) hypertension: Secondary | ICD-10-CM | POA: Diagnosis not present

## 2015-06-06 DIAGNOSIS — I5042 Chronic combined systolic (congestive) and diastolic (congestive) heart failure: Secondary | ICD-10-CM | POA: Diagnosis not present

## 2015-06-06 DIAGNOSIS — I480 Paroxysmal atrial fibrillation: Secondary | ICD-10-CM | POA: Diagnosis not present

## 2015-06-06 DIAGNOSIS — I447 Left bundle-branch block, unspecified: Secondary | ICD-10-CM | POA: Diagnosis not present

## 2015-06-06 DIAGNOSIS — I428 Other cardiomyopathies: Secondary | ICD-10-CM | POA: Diagnosis not present

## 2015-06-08 DIAGNOSIS — I428 Other cardiomyopathies: Secondary | ICD-10-CM | POA: Diagnosis not present

## 2015-06-08 DIAGNOSIS — E119 Type 2 diabetes mellitus without complications: Secondary | ICD-10-CM | POA: Diagnosis not present

## 2015-06-08 DIAGNOSIS — E784 Other hyperlipidemia: Secondary | ICD-10-CM | POA: Diagnosis not present

## 2015-06-08 DIAGNOSIS — R0602 Shortness of breath: Secondary | ICD-10-CM | POA: Diagnosis not present

## 2015-06-15 DIAGNOSIS — E119 Type 2 diabetes mellitus without complications: Secondary | ICD-10-CM | POA: Diagnosis not present

## 2015-06-15 DIAGNOSIS — E782 Mixed hyperlipidemia: Secondary | ICD-10-CM | POA: Diagnosis not present

## 2015-06-15 DIAGNOSIS — N183 Chronic kidney disease, stage 3 (moderate): Secondary | ICD-10-CM | POA: Diagnosis not present

## 2015-06-15 DIAGNOSIS — I10 Essential (primary) hypertension: Secondary | ICD-10-CM | POA: Diagnosis not present

## 2015-06-15 DIAGNOSIS — E559 Vitamin D deficiency, unspecified: Secondary | ICD-10-CM | POA: Diagnosis not present

## 2015-06-27 DIAGNOSIS — I5042 Chronic combined systolic (congestive) and diastolic (congestive) heart failure: Secondary | ICD-10-CM | POA: Diagnosis not present

## 2015-06-27 DIAGNOSIS — I447 Left bundle-branch block, unspecified: Secondary | ICD-10-CM | POA: Diagnosis not present

## 2015-06-27 DIAGNOSIS — I480 Paroxysmal atrial fibrillation: Secondary | ICD-10-CM | POA: Diagnosis not present

## 2015-06-27 DIAGNOSIS — I428 Other cardiomyopathies: Secondary | ICD-10-CM | POA: Diagnosis not present

## 2015-07-07 DIAGNOSIS — I447 Left bundle-branch block, unspecified: Secondary | ICD-10-CM | POA: Diagnosis not present

## 2015-07-07 DIAGNOSIS — I428 Other cardiomyopathies: Secondary | ICD-10-CM | POA: Diagnosis not present

## 2015-07-07 DIAGNOSIS — I480 Paroxysmal atrial fibrillation: Secondary | ICD-10-CM | POA: Diagnosis not present

## 2015-07-07 DIAGNOSIS — I5042 Chronic combined systolic (congestive) and diastolic (congestive) heart failure: Secondary | ICD-10-CM | POA: Diagnosis not present

## 2015-08-01 DIAGNOSIS — K5900 Constipation, unspecified: Secondary | ICD-10-CM | POA: Diagnosis not present

## 2015-08-01 DIAGNOSIS — K648 Other hemorrhoids: Secondary | ICD-10-CM | POA: Diagnosis not present

## 2015-08-01 DIAGNOSIS — K625 Hemorrhage of anus and rectum: Secondary | ICD-10-CM | POA: Diagnosis not present

## 2015-08-05 DIAGNOSIS — I447 Left bundle-branch block, unspecified: Secondary | ICD-10-CM | POA: Diagnosis not present

## 2015-08-05 DIAGNOSIS — I480 Paroxysmal atrial fibrillation: Secondary | ICD-10-CM | POA: Diagnosis not present

## 2015-08-05 DIAGNOSIS — I428 Other cardiomyopathies: Secondary | ICD-10-CM | POA: Diagnosis not present

## 2015-08-05 DIAGNOSIS — I5042 Chronic combined systolic (congestive) and diastolic (congestive) heart failure: Secondary | ICD-10-CM | POA: Diagnosis not present

## 2015-08-23 ENCOUNTER — Ambulatory Visit (INDEPENDENT_AMBULATORY_CARE_PROVIDER_SITE_OTHER): Payer: Medicare Other | Admitting: Cardiology

## 2015-08-23 ENCOUNTER — Encounter: Payer: Self-pay | Admitting: Cardiology

## 2015-08-23 VITALS — BP 126/72 | HR 64 | Ht 67.0 in | Wt 172.8 lb

## 2015-08-23 DIAGNOSIS — I429 Cardiomyopathy, unspecified: Secondary | ICD-10-CM

## 2015-08-23 DIAGNOSIS — I428 Other cardiomyopathies: Secondary | ICD-10-CM

## 2015-08-23 NOTE — Progress Notes (Signed)
Electrophysiology Office Note   Date:  08/24/2015   ID:  Janet Owen, Janet Owen 11-13-1933, MRN [id number]  PCP:  Bartholome Bill, MD  Cardiologist:  Einar Gip Primary Electrophysiologist:  Claris Guymon Meredith Leeds, MD    Chief Complaint  Patient presents with  . Atrial Fibrillation     History of Present Illness: Janet Owen is a 82 y.o. female who presents today for electrophysiology evaluation.   She has a nonischemic cardio myopathy which was diagnosed in 2015 with coronary angiography at that time revealing no significant coronary artery disease and an EF of 35%. She was in atrial fibrillation when she presented during that hospitalization with shortness of breath. She had a left bundle-branch block as well. She presented to [facility] on 07/07/15 with worsening dyspnea and was found to be back in atrial fibrillation with rapid ventricular responses. She was started on amiodarone at that time. When she returned to the office one month later she says that she initially had an improved amount of energy and improved dyspnea, but that has since decreased. She continues to have significant fatigue. She had an echocardiogram 06/08/15 that showed mild concentric LVH general hypokinesis and an EF of 26%. The left atrial cavity was severely dilated at that time.    Today, she denies symptoms of palpitations, chest pain, lower extremity edema, claudication, dizziness, presyncope, syncope, bleeding, or neurologic sequela. The patient is tolerating medications without difficulties. She does say that she has significant fatigue and shortness of breath. She used to play golf but has not played in the last year due to her issues of fatigue.  Past Medical History  Diagnosis Date  . Hypertension   . Heart failure ([place])   . DM (diabetes mellitus) ([place])    Past Surgical History  Procedure Laterality Date  . Vesicovaginal fistula closure w/ tah       Current Outpatient  Prescriptions  Medication Sig Dispense Refill  . amiodarone (PACERONE) 200 MG tablet Take 200 mg by mouth daily.  0  . apixaban (ELIQUIS) 5 MG TABS tablet Take 5 mg by mouth 2 (two) times daily.    Marland Kitchen atorvastatin (LIPITOR) 20 MG tablet Take 20 mg by mouth daily.    . carvedilol (COREG) 12.5 MG tablet Take 12.5 mg by mouth 2 (two) times daily with a meal.    . ezetimibe (ZETIA) 10 MG tablet Take 5 mg by mouth daily. Take one-half tablet (5 mg) by mouth daily    . furosemide (LASIX) 20 MG tablet Take 20 mg by mouth daily as needed.     . metFORMIN (GLUCOPHAGE) 1000 MG tablet Take 1,000 mg by mouth 2 (two) times daily with a meal.    . Multiple Vitamin (MULTIVITAMIN) capsule Take 1 capsule by mouth daily.    Marland Kitchen spironolactone (ALDACTONE) 25 MG tablet Take 25 mg by mouth daily.  0  . valsartan (DIOVAN) 160 MG tablet Take 1 tablet (160 mg total) by mouth daily. (Patient taking differently: Take 80 mg by mouth daily. Take one-half tablet (80 mg) daily) 30 tablet 11   No current facility-administered medications for this visit.    Allergies:   Review of patient's allergies indicates no known allergies.   Social History:  The patient  reports that she has never smoked. She has never used smokeless tobacco. She reports that she does not drink alcohol or use illicit drugs.   Family History:  The patient's family history includes Alcohol abuse in her father; Asthma in  her mother; Pancreatitis in her mother.    ROS:  Please see the history of present illness.   Otherwise, review of systems is positive for Leg swelling, dyspnea on exertion, back pain, found problems, easy bruising.   All other sy oldstems are reviewed and negative.    PHYSICAL EXAM: VS:  BP 126/72 mmHg  Pulse 64  Ht '5\' 7"'$  (1.702 m)  Wt 172 lb 12.8 oz (78.382 kg)  BMI 27.06 kg/m2 , BMI Body mass index is 27.06 kg/(m^2). GEN: Well nourished, well developed, in no acute distress HEENT: normal Neck: no JVD, carotid bruits, or  masses Cardiac: RRR; no murmurs, rubs, or gallops,no edema  Respiratory:  clear to auscultation bilaterally, normal work of breathing GI: soft, nontender, nondistended, + BS MS: no deformity or atrophy Skin: warm and dry Neuro:  Strength and sensation are intact Psych: euthymic mood, full affect  EKG:  EKG is ordered today. The ekg ordered today shows sinus rhythm, rate 64, LBBB  Recent Labs: No results found for requested labs within last 365 days.    Lipid Panel  No results found for: CHOL, TRIG, HDL, CHOLHDL, VLDL, LDLCALC, LDLDIRECT   Wt Readings from Last 3 Encounters:  08/23/15 172 lb 12.8 oz (78.382 kg)  08/20/14 169 lb 3.2 oz (76.749 kg)      Other studies Reviewed: Additional studies/ records that were reviewed today include: Cardiology notes  Review of the above records today demonstrates:   TTE 06/08/15 Left ventricular cavity is normal in size. Mild concentric LVH. Abnormal septal wall motion. Marked general hypokinesis with an EF of 26%. Left atrial cavity severely dilated. Mild calcification of the mitral annulus. Mild mitral leaflet calcification. Mild to moderate mitral regurgitation. Pulmonary artery systolic pressure 33 mmHg   ASSESSMENT AND PLAN:  1.  Atrial fibrillation: Currently on Eliquis and amiodarone.  In normal rhythm today and tolerating medications well.  This patients CHA2DS2-VASc Score and unadjusted Ischemic Stroke Rate (% per year) is equal to 9.7 % stroke rate/year from a score of 6  Above score calculated as 1 point each if present [CHF, HTN, DM, Vascular=MI/PAD/Aortic Plaque, Age if 65-74, or Female] Above score calculated as 2 points each if present [Age > 75, or Stroke/TIA/TE]  2. Nonischemic cardiomyopathy: Has persistent cardiomyopathy with a left bundle-branch block. I did discuss with her the option of CRT-D. We discussed the risks of the procedure as well as the benefits. Risks include bleeding, infection, tamponade, pneumothorax.  We also discussed that CRT could make her feel better with less fatigue and shortness of breath. She says that she would like to think about this and Drakkar Medeiros call us back with a decision.     Current medicines are reviewed at length with the patient today.   The patient does not have concerns regarding her medicines.  The following changes were made today:  none  Labs/ tests ordered today include:  No orders of the defined types were placed in this encounter.     Disposition:   FU with Toiya Morrish PRN  Signed, Ariahna Smiddy Meredith Leeds, MD  08/24/2015 3:35 PM     [place] [address] [place] [address] [phone] (office) [phone] (fax)

## 2015-08-23 NOTE — Patient Instructions (Signed)
Medication Instructions: Continue your current medications  Labwork: None ordered  Procedures/Testing: CRT or cardiac resynchronization therapy is a treatment used to correct heart failure. When you have heart failure your heart is weakened and doesn't pump as well as it should. This therapy may help reduce symptoms and improve the quality of life.  Please see the handout/brochure given to you today to get more information of the different options of therapy.    Follow-Up: Please call the office when you are ready to schedule this procedure.    Any Additional Special Instructions Will Be Listed Below (If Applicable).     If you need a refill on your cardiac medications before your next appointment, please call your pharmacy.

## 2015-08-25 ENCOUNTER — Encounter: Payer: Self-pay | Admitting: *Deleted

## 2015-08-25 ENCOUNTER — Telehealth: Payer: Self-pay | Admitting: Cardiology

## 2015-08-25 DIAGNOSIS — I428 Other cardiomyopathies: Secondary | ICD-10-CM

## 2015-08-25 DIAGNOSIS — Z01812 Encounter for preprocedural laboratory examination: Secondary | ICD-10-CM

## 2015-08-25 NOTE — Addendum Note (Signed)
Addended by: Stanton Kidney on: 08/25/2015 05:30 PM   Modules accepted: Orders

## 2015-08-25 NOTE — Telephone Encounter (Signed)
New Message  Pt caling to speak w/ RN about sched CRT.Please call back and discuss.

## 2015-08-25 NOTE — Telephone Encounter (Signed)
Patient would like to have CRT-D implanted on 7/24.  Pre procedure labs on 7/17. Letter of instructions reviewed with patient and left at front desk for her to pick up on Monday when she comes in for lab work. Surgical scrub instructions also left at front desk w/ surgical scrub. Wound check appt made for 8/3. Patient verbalized understanding and agreeable to plan.

## 2015-08-29 ENCOUNTER — Other Ambulatory Visit (INDEPENDENT_AMBULATORY_CARE_PROVIDER_SITE_OTHER): Payer: Medicare Other | Admitting: *Deleted

## 2015-08-29 DIAGNOSIS — Z01812 Encounter for preprocedural laboratory examination: Secondary | ICD-10-CM | POA: Diagnosis not present

## 2015-08-29 DIAGNOSIS — I429 Cardiomyopathy, unspecified: Secondary | ICD-10-CM | POA: Diagnosis not present

## 2015-08-29 DIAGNOSIS — I428 Other cardiomyopathies: Secondary | ICD-10-CM

## 2015-08-29 LAB — CBC WITH DIFFERENTIAL/PLATELET
BASOS ABS: 76 {cells}/uL (ref 0–200)
Basophils Relative: 1 %
EOS ABS: 380 {cells}/uL (ref 15–500)
EOS PCT: 5 %
HCT: 35.7 % (ref 35.0–45.0)
Hemoglobin: 11.3 g/dL — ABNORMAL LOW (ref 11.7–15.5)
LYMPHS PCT: 18 %
Lymphs Abs: 1368 cells/uL (ref 850–3900)
MCH: 24 pg — AB (ref 27.0–33.0)
MCHC: 31.7 g/dL — AB (ref 32.0–36.0)
MCV: 75.8 fL — AB (ref 80.0–100.0)
MONOS PCT: 8 %
MPV: 10.7 fL (ref 7.5–12.5)
Monocytes Absolute: 608 cells/uL (ref 200–950)
Neutro Abs: 5168 cells/uL (ref 1500–7800)
Neutrophils Relative %: 68 %
PLATELETS: 227 10*3/uL (ref 140–400)
RBC: 4.71 MIL/uL (ref 3.80–5.10)
RDW: 16.8 % — AB (ref 11.0–15.0)
WBC: 7.6 10*3/uL (ref 3.8–10.8)

## 2015-08-29 LAB — BASIC METABOLIC PANEL
BUN: 32 mg/dL — AB (ref 7–25)
CALCIUM: 9.3 mg/dL (ref 8.6–10.4)
CHLORIDE: 103 mmol/L (ref 98–110)
CO2: 21 mmol/L (ref 20–31)
CREATININE: 1.57 mg/dL — AB (ref 0.60–0.88)
Glucose, Bld: 250 mg/dL — ABNORMAL HIGH (ref 65–99)
Potassium: 5.9 mmol/L — ABNORMAL HIGH (ref 3.5–5.3)
Sodium: 136 mmol/L (ref 135–146)

## 2015-08-31 ENCOUNTER — Telehealth: Payer: Self-pay | Admitting: *Deleted

## 2015-08-31 DIAGNOSIS — E875 Hyperkalemia: Secondary | ICD-10-CM

## 2015-08-31 NOTE — Telephone Encounter (Signed)
Spoke with patient about her increased K and she will stop her Aldactone and repeat a BMP tomorrow

## 2015-09-01 ENCOUNTER — Other Ambulatory Visit (INDEPENDENT_AMBULATORY_CARE_PROVIDER_SITE_OTHER): Payer: Medicare Other | Admitting: *Deleted

## 2015-09-01 DIAGNOSIS — E875 Hyperkalemia: Secondary | ICD-10-CM

## 2015-09-01 LAB — BASIC METABOLIC PANEL
BUN: 26 mg/dL — ABNORMAL HIGH (ref 7–25)
CALCIUM: 9.5 mg/dL (ref 8.6–10.4)
CO2: 22 mmol/L (ref 20–31)
CREATININE: 1.45 mg/dL — AB (ref 0.60–0.88)
Chloride: 104 mmol/L (ref 98–110)
Glucose, Bld: 100 mg/dL — ABNORMAL HIGH (ref 65–99)
Potassium: 5.8 mmol/L — ABNORMAL HIGH (ref 3.5–5.3)
Sodium: 137 mmol/L (ref 135–146)

## 2015-09-05 ENCOUNTER — Other Ambulatory Visit: Payer: Self-pay | Admitting: Cardiology

## 2015-09-05 ENCOUNTER — Encounter (HOSPITAL_COMMUNITY)
Admission: RE | Disposition: A | Discharge status: Home / Self Care | Payer: Self-pay | Source: Ambulatory Visit | Attending: Cardiology

## 2015-09-05 ENCOUNTER — Encounter (HOSPITAL_COMMUNITY): Payer: Self-pay | Admitting: *Deleted

## 2015-09-05 ENCOUNTER — Ambulatory Visit (HOSPITAL_COMMUNITY)
Admission: RE | Admit: 2015-09-05 | Discharge: 2015-09-05 | Disposition: A | Discharge status: Home / Self Care | Payer: Medicare Other | Source: Ambulatory Visit | Attending: Cardiology | Admitting: Cardiology

## 2015-09-05 ENCOUNTER — Telehealth: Payer: Self-pay | Admitting: Cardiology

## 2015-09-05 DIAGNOSIS — Z539 Procedure and treatment not carried out, unspecified reason: Secondary | ICD-10-CM | POA: Insufficient documentation

## 2015-09-05 DIAGNOSIS — I5022 Chronic systolic (congestive) heart failure: Secondary | ICD-10-CM

## 2015-09-05 LAB — GLUCOSE, CAPILLARY: Glucose-Capillary: 121 mg/dL — ABNORMAL HIGH (ref 65–99)

## 2015-09-05 LAB — BASIC METABOLIC PANEL
ANION GAP: 7 (ref 5–15)
BUN: 28 mg/dL — AB (ref 6–20)
CHLORIDE: 109 mmol/L (ref 101–111)
CO2: 22 mmol/L (ref 22–32)
Calcium: 9.7 mg/dL (ref 8.9–10.3)
Creatinine, Ser: 1.29 mg/dL — ABNORMAL HIGH (ref 0.44–1.00)
GFR, EST AFRICAN AMERICAN: 44 mL/min — AB (ref >60)
GFR, EST NON AFRICAN AMERICAN: 38 mL/min — AB (ref >60)
Glucose, Bld: 128 mg/dL — ABNORMAL HIGH (ref 65–99)
POTASSIUM: 6 mmol/L — AB (ref 3.5–5.1)
SODIUM: 138 mmol/L (ref 135–145)

## 2015-09-05 LAB — SURGICAL PCR SCREEN
MRSA, PCR: NEGATIVE
STAPHYLOCOCCUS AUREUS: NEGATIVE

## 2015-09-05 SURGERY — BIV ICD INSERTION CRT-D
Anesthesia: LOCAL

## 2015-09-05 MED ORDER — SODIUM POLYSTYRENE SULFONATE 15 GM/60ML PO SUSP: 15.0000 g | Freq: Once | ORAL | 0 refills | Status: AC

## 2015-09-05 MED ORDER — CEFAZOLIN SODIUM-DEXTROSE 2-4 GM/100ML-% IV SOLN: 2.0000 g | INTRAVENOUS | Status: DC

## 2015-09-05 MED ORDER — GENTAMICIN SULFATE 40 MG/ML IJ SOLN
INTRAMUSCULAR | Status: AC
  Filled 2015-09-05: qty 2

## 2015-09-05 MED ORDER — CEFAZOLIN SODIUM-DEXTROSE 2-4 GM/100ML-% IV SOLN
INTRAVENOUS | Status: AC
  Filled 2015-09-05: qty 100

## 2015-09-05 MED ORDER — SODIUM CHLORIDE 0.9 % IR SOLN: 80.0000 mg | Status: DC

## 2015-09-05 MED ORDER — SODIUM CHLORIDE 0.9 % IV SOLN
INTRAVENOUS | Status: DC
  Administered 2015-09-05: 09:00:00 via INTRAVENOUS

## 2015-09-05 MED ORDER — MUPIROCIN 2 % EX OINT
TOPICAL_OINTMENT | CUTANEOUS | Status: AC
  Administered 2015-09-05: 1 via NASAL
  Filled 2015-09-05: qty 22

## 2015-09-05 MED ORDER — SODIUM POLYSTYRENE SULFONATE PO POWD: Freq: Every day | ORAL | 0 refills | Status: DC

## 2015-09-05 MED ORDER — CHLORHEXIDINE GLUCONATE 4 % EX LIQD: 60.0000 mL | Freq: Once | CUTANEOUS | Status: DC

## 2015-09-05 NOTE — Telephone Encounter (Signed)
Informed patient new rx sent in.

## 2015-09-05 NOTE — Telephone Encounter (Signed)
New Message  Marya Amsler, Rep from [facility], calling to clarify pt's Rx of- sodium polystyrene- wanted to know if it should be in powder form. Please call back and discuss.

## 2015-09-05 NOTE — Progress Notes (Signed)
Janet Owen presented to the hospital for CRT-D placement.  She was found to have a persistently elevated K.  Due to her elevated K, her procedure was canceled.  She was put on kayexylate with a repeat lab check in 3 days.  ECG unchanged from previous.  [name], MD

## 2015-09-06 ENCOUNTER — Telehealth: Payer: Self-pay | Admitting: *Deleted

## 2015-09-06 NOTE — Telephone Encounter (Signed)
Called to reschedule CRTD imlant for 7/27. Reviewed instructions with patient. Patient will come by the office tomorrow afternoon for follow up blood work secondary to hyperkalemia.

## 2015-09-07 ENCOUNTER — Other Ambulatory Visit (INDEPENDENT_AMBULATORY_CARE_PROVIDER_SITE_OTHER): Payer: Medicare Other

## 2015-09-07 DIAGNOSIS — I5022 Chronic systolic (congestive) heart failure: Secondary | ICD-10-CM

## 2015-09-08 ENCOUNTER — Telehealth: Payer: Self-pay | Admitting: *Deleted

## 2015-09-08 ENCOUNTER — Ambulatory Visit (HOSPITAL_COMMUNITY): Admission: RE | Admit: 2015-09-08 | Payer: Medicare Other | Source: Ambulatory Visit | Admitting: Cardiology

## 2015-09-08 ENCOUNTER — Encounter (HOSPITAL_COMMUNITY): Admission: RE | Payer: Self-pay | Source: Ambulatory Visit

## 2015-09-08 LAB — BASIC METABOLIC PANEL
BUN: 28 mg/dL — ABNORMAL HIGH (ref 7–25)
CALCIUM: 9.5 mg/dL (ref 8.6–10.4)
CO2: 22 mmol/L (ref 20–31)
Chloride: 103 mmol/L (ref 98–110)
Creat: 1.35 mg/dL — ABNORMAL HIGH (ref 0.60–0.88)
Glucose, Bld: 93 mg/dL (ref 65–99)
Potassium: 5.5 mmol/L — ABNORMAL HIGH (ref 3.5–5.3)
SODIUM: 138 mmol/L (ref 135–146)

## 2015-09-08 SURGERY — BIV ICD INSERTION CRT-D
Anesthesia: LOCAL

## 2015-09-08 MED ORDER — AMLODIPINE BESYLATE 5 MG PO TABS: 5.0000 mg | ORAL_TABLET | Freq: Every day | ORAL | 6 refills | Status: DC

## 2015-09-08 NOTE — Telephone Encounter (Addendum)
Informed patient her potassium level is still high.  Procedure has been cancelled.  Explained that I am reviewing her history and medications w/ pharmacist and will call her back with advisement.

## 2015-09-08 NOTE — Telephone Encounter (Signed)
After reviewing w/ pharmacist, Gay Filler, pt advised to stop Diovan as this may be a contributing factor to her hyperkalemia.    Patient states she has been on Diovan for years for her BP.  Advised patient to start Norvasc 5 mg daily and monitor her BPs.  She understands to call the office if her BP is not controlled on this medication or side effects begin.  Patient also advised to take Lasix 20 mEq once today. Patient has a list of foods that contain low, medium and high amounts of potassium for her education. Repeat BMET ordered for Monday. Patient aware that I will review result when I am back in the office on Tuesday, 8/1, and will call her by Wednesday with next step in plan of care. We will determine next week when to reschedule her procedure. She is agreeable to above stated plan.

## 2015-09-12 ENCOUNTER — Other Ambulatory Visit: Payer: Medicare Other | Admitting: *Deleted

## 2015-09-12 DIAGNOSIS — R0600 Dyspnea, unspecified: Secondary | ICD-10-CM

## 2015-09-12 DIAGNOSIS — I10 Essential (primary) hypertension: Secondary | ICD-10-CM | POA: Diagnosis not present

## 2015-09-12 DIAGNOSIS — R938 Abnormal findings on diagnostic imaging of other specified body structures: Secondary | ICD-10-CM | POA: Diagnosis not present

## 2015-09-12 DIAGNOSIS — R9389 Abnormal findings on diagnostic imaging of other specified body structures: Secondary | ICD-10-CM

## 2015-09-12 LAB — BASIC METABOLIC PANEL
BUN: 29 mg/dL — ABNORMAL HIGH (ref 7–25)
CALCIUM: 9.4 mg/dL (ref 8.6–10.4)
CO2: 23 mmol/L (ref 20–31)
CREATININE: 1.43 mg/dL — AB (ref 0.60–0.88)
Chloride: 105 mmol/L (ref 98–110)
Glucose, Bld: 179 mg/dL — ABNORMAL HIGH (ref 65–99)
Potassium: 5 mmol/L (ref 3.5–5.3)
SODIUM: 138 mmol/L (ref 135–146)

## 2015-09-12 NOTE — Addendum Note (Signed)
Addended by: Eulis Foster on: 09/12/2015 10:22 AM   Modules accepted: Orders

## 2015-09-14 ENCOUNTER — Telehealth: Payer: Self-pay | Admitting: Cardiology

## 2015-09-14 NOTE — Telephone Encounter (Signed)
New message     The pt was wanting to speak with nurse regarding a blood work done on Monday and possible up coming surgery.

## 2015-09-14 NOTE — Telephone Encounter (Signed)
Rescheduled CRTD for tomorrow, per Dr. Curt Bears. Reviewed instructions with patient. Scheduled wound check f/u post procedure. Patient verbalized understanding and agreeable to plan.   She is aware I will follow up with her when I return from vacation around middle of August  -- to follow up on potassium level.

## 2015-09-15 ENCOUNTER — Ambulatory Visit (HOSPITAL_COMMUNITY)
Admission: RE | Admit: 2015-09-15 | Discharge: 2015-09-16 | Disposition: A | Discharge status: Home / Self Care | Payer: Medicare Other | Source: Ambulatory Visit | Attending: Cardiology | Admitting: Cardiology

## 2015-09-15 ENCOUNTER — Encounter (HOSPITAL_COMMUNITY): Payer: Self-pay | Admitting: Nurse Practitioner

## 2015-09-15 ENCOUNTER — Encounter (HOSPITAL_COMMUNITY)
Admission: RE | Disposition: A | Discharge status: Home / Self Care | Payer: Self-pay | Source: Ambulatory Visit | Attending: Cardiology

## 2015-09-15 ENCOUNTER — Ambulatory Visit: Payer: Medicare Other

## 2015-09-15 DIAGNOSIS — I447 Left bundle-branch block, unspecified: Secondary | ICD-10-CM | POA: Diagnosis not present

## 2015-09-15 DIAGNOSIS — I5022 Chronic systolic (congestive) heart failure: Secondary | ICD-10-CM | POA: Diagnosis not present

## 2015-09-15 DIAGNOSIS — Z7984 Long term (current) use of oral hypoglycemic drugs: Secondary | ICD-10-CM | POA: Diagnosis not present

## 2015-09-15 DIAGNOSIS — I110 Hypertensive heart disease with heart failure: Secondary | ICD-10-CM | POA: Diagnosis not present

## 2015-09-15 DIAGNOSIS — I509 Heart failure, unspecified: Secondary | ICD-10-CM | POA: Diagnosis not present

## 2015-09-15 DIAGNOSIS — E119 Type 2 diabetes mellitus without complications: Secondary | ICD-10-CM | POA: Insufficient documentation

## 2015-09-15 DIAGNOSIS — I255 Ischemic cardiomyopathy: Secondary | ICD-10-CM | POA: Diagnosis not present

## 2015-09-15 DIAGNOSIS — I429 Cardiomyopathy, unspecified: Secondary | ICD-10-CM

## 2015-09-15 DIAGNOSIS — E875 Hyperkalemia: Secondary | ICD-10-CM | POA: Insufficient documentation

## 2015-09-15 DIAGNOSIS — Z006 Encounter for examination for normal comparison and control in clinical research program: Secondary | ICD-10-CM | POA: Diagnosis not present

## 2015-09-15 DIAGNOSIS — I480 Paroxysmal atrial fibrillation: Secondary | ICD-10-CM | POA: Insufficient documentation

## 2015-09-15 DIAGNOSIS — I428 Other cardiomyopathies: Secondary | ICD-10-CM | POA: Insufficient documentation

## 2015-09-15 DIAGNOSIS — I700 Atherosclerosis of aorta: Secondary | ICD-10-CM | POA: Insufficient documentation

## 2015-09-15 DIAGNOSIS — Z79899 Other long term (current) drug therapy: Secondary | ICD-10-CM | POA: Insufficient documentation

## 2015-09-15 DIAGNOSIS — Z95818 Presence of other cardiac implants and grafts: Secondary | ICD-10-CM

## 2015-09-15 HISTORY — DX: Unspecified atrial fibrillation: I48.91

## 2015-09-15 HISTORY — PX: EP IMPLANTABLE DEVICE: SHX172B

## 2015-09-15 LAB — CBC
HCT: 35.2 % — ABNORMAL LOW (ref 36.0–46.0)
HEMOGLOBIN: 10.6 g/dL — AB (ref 12.0–15.0)
MCH: 23.5 pg — AB (ref 26.0–34.0)
MCHC: 30.1 g/dL (ref 30.0–36.0)
MCV: 77.9 fL — AB (ref 78.0–100.0)
Platelets: 191 10*3/uL (ref 150–400)
RBC: 4.52 MIL/uL (ref 3.87–5.11)
RDW: 16.9 % — ABNORMAL HIGH (ref 11.5–15.5)
WBC: 6.7 10*3/uL (ref 4.0–10.5)

## 2015-09-15 LAB — BASIC METABOLIC PANEL
Anion gap: 6 (ref 5–15)
BUN: 24 mg/dL — AB (ref 6–20)
CHLORIDE: 108 mmol/L (ref 101–111)
CO2: 25 mmol/L (ref 22–32)
Calcium: 9.7 mg/dL (ref 8.9–10.3)
Creatinine, Ser: 1.29 mg/dL — ABNORMAL HIGH (ref 0.44–1.00)
GFR calc Af Amer: 44 mL/min — ABNORMAL LOW (ref >60)
GFR calc non Af Amer: 38 mL/min — ABNORMAL LOW (ref >60)
GLUCOSE: 107 mg/dL — AB (ref 65–99)
POTASSIUM: 4.8 mmol/L (ref 3.5–5.1)
SODIUM: 139 mmol/L (ref 135–145)

## 2015-09-15 LAB — GLUCOSE, CAPILLARY: GLUCOSE-CAPILLARY: 98 mg/dL (ref 65–99)

## 2015-09-15 SURGERY — BIV ICD INSERTION CRT-D
Anesthesia: LOCAL

## 2015-09-15 MED ORDER — ONDANSETRON HCL 4 MG/2ML IJ SOLN: 4.0000 mg | Freq: Four times a day (QID) | INTRAMUSCULAR | Status: DC | PRN

## 2015-09-15 MED ORDER — CEFAZOLIN IN D5W 1 GM/50ML IV SOLN
1.0000 g | Freq: Four times a day (QID) | INTRAVENOUS | Status: AC
  Administered 2015-09-15 – 2015-09-16 (×3): 1 g via INTRAVENOUS
  Filled 2015-09-15 (×3): qty 50

## 2015-09-15 MED ORDER — IOHEXOL 350 MG/ML SOLN
INTRAVENOUS | Status: DC | PRN
  Administered 2015-09-15: 25 mL via INTRAVENOUS

## 2015-09-15 MED ORDER — HYDRALAZINE HCL 20 MG/ML IJ SOLN
INTRAMUSCULAR | Status: AC
  Filled 2015-09-15: qty 1

## 2015-09-15 MED ORDER — LIDOCAINE HCL (PF) 1 % IJ SOLN
INTRAMUSCULAR | Status: DC | PRN
  Administered 2015-09-15: 44 mL via SUBCUTANEOUS

## 2015-09-15 MED ORDER — MUPIROCIN 2 % EX OINT
1.0000 "application " | TOPICAL_OINTMENT | Freq: Once | CUTANEOUS | Status: DC
  Filled 2015-09-15: qty 22

## 2015-09-15 MED ORDER — SODIUM CHLORIDE 0.9 % IV SOLN
INTRAVENOUS | Status: DC
  Administered 2015-09-15: 06:00:00 via INTRAVENOUS

## 2015-09-15 MED ORDER — HEPARIN (PORCINE) IN NACL 2-0.9 UNIT/ML-% IJ SOLN
INTRAMUSCULAR | Status: AC
  Filled 2015-09-15: qty 1000

## 2015-09-15 MED ORDER — MIDAZOLAM HCL 5 MG/5ML IJ SOLN
INTRAMUSCULAR | Status: AC
  Filled 2015-09-15: qty 5

## 2015-09-15 MED ORDER — METFORMIN HCL 500 MG PO TABS
1000.0000 mg | ORAL_TABLET | Freq: Two times a day (BID) | ORAL | Status: DC
  Administered 2015-09-15 – 2015-09-16 (×2): 1000 mg via ORAL
  Filled 2015-09-15 (×2): qty 2

## 2015-09-15 MED ORDER — MIDAZOLAM HCL 5 MG/5ML IJ SOLN
INTRAMUSCULAR | Status: DC | PRN
  Administered 2015-09-15 (×9): 1 mg via INTRAVENOUS

## 2015-09-15 MED ORDER — LIDOCAINE HCL (PF) 1 % IJ SOLN
INTRAMUSCULAR | Status: AC
  Filled 2015-09-15: qty 30

## 2015-09-15 MED ORDER — CEFAZOLIN SODIUM-DEXTROSE 2-4 GM/100ML-% IV SOLN
2.0000 g | INTRAVENOUS | Status: AC
  Administered 2015-09-15: 2 g via INTRAVENOUS
  Filled 2015-09-15: qty 100

## 2015-09-15 MED ORDER — FUROSEMIDE 20 MG PO TABS
20.0000 mg | ORAL_TABLET | Freq: Every day | ORAL | Status: DC
  Filled 2015-09-15: qty 1

## 2015-09-15 MED ORDER — DOXYLAMINE SUCCINATE (SLEEP) 25 MG PO TABS
25.0000 mg | ORAL_TABLET | Freq: Every evening | ORAL | Status: DC | PRN
  Filled 2015-09-15: qty 1

## 2015-09-15 MED ORDER — MUPIROCIN 2 % EX OINT
TOPICAL_OINTMENT | CUTANEOUS | Status: AC
  Filled 2015-09-15: qty 22

## 2015-09-15 MED ORDER — HYDRALAZINE HCL 20 MG/ML IJ SOLN
INTRAMUSCULAR | Status: DC | PRN
  Administered 2015-09-15 (×2): 10 mg via INTRAVENOUS

## 2015-09-15 MED ORDER — ADULT MULTIVITAMIN W/MINERALS CH
1.0000 | ORAL_TABLET | Freq: Every day | ORAL | Status: DC
  Filled 2015-09-15: qty 1

## 2015-09-15 MED ORDER — ACETAMINOPHEN 325 MG PO TABS
325.0000 mg | ORAL_TABLET | ORAL | Status: DC | PRN
  Administered 2015-09-15 (×2): 650 mg via ORAL
  Filled 2015-09-15 (×2): qty 2

## 2015-09-15 MED ORDER — HEPARIN (PORCINE) IN NACL 2-0.9 UNIT/ML-% IJ SOLN
INTRAMUSCULAR | Status: DC | PRN
  Administered 2015-09-15: 500 mL via TOPICAL

## 2015-09-15 MED ORDER — ATORVASTATIN CALCIUM 20 MG PO TABS
20.0000 mg | ORAL_TABLET | Freq: Every day | ORAL | Status: DC
  Administered 2015-09-15 – 2015-09-16 (×2): 20 mg via ORAL
  Filled 2015-09-15 (×2): qty 1

## 2015-09-15 MED ORDER — FENTANYL CITRATE (PF) 100 MCG/2ML IJ SOLN
INTRAMUSCULAR | Status: AC
  Filled 2015-09-15: qty 2

## 2015-09-15 MED ORDER — MULTIVITAMINS PO CAPS: 1.0000 | ORAL_CAPSULE | Freq: Every day | ORAL | Status: DC

## 2015-09-15 MED ORDER — SODIUM CHLORIDE 0.9 % IR SOLN
Status: AC
  Filled 2015-09-15: qty 2

## 2015-09-15 MED ORDER — CEFAZOLIN SODIUM-DEXTROSE 2-3 GM-% IV SOLR
INTRAVENOUS | Status: DC | PRN
  Administered 2015-09-15: 2 g via INTRAVENOUS

## 2015-09-15 MED ORDER — AMIODARONE HCL 200 MG PO TABS
200.0000 mg | ORAL_TABLET | Freq: Every day | ORAL | Status: DC
  Administered 2015-09-15 – 2015-09-16 (×2): 200 mg via ORAL
  Filled 2015-09-15 (×2): qty 1

## 2015-09-15 MED ORDER — FENTANYL CITRATE (PF) 100 MCG/2ML IJ SOLN
INTRAMUSCULAR | Status: DC | PRN
  Administered 2015-09-15 (×7): 25 ug via INTRAVENOUS

## 2015-09-15 MED ORDER — EZETIMIBE 10 MG PO TABS
5.0000 mg | ORAL_TABLET | Freq: Every day | ORAL | Status: DC
  Administered 2015-09-15 – 2015-09-16 (×2): 5 mg via ORAL
  Filled 2015-09-15 (×2): qty 1

## 2015-09-15 MED ORDER — CHLORHEXIDINE GLUCONATE 4 % EX LIQD
60.0000 mL | Freq: Once | CUTANEOUS | Status: DC
  Filled 2015-09-15: qty 60

## 2015-09-15 MED ORDER — GENTAMICIN SULFATE 40 MG/ML IJ SOLN: 80.0000 mg | INTRAMUSCULAR | Status: DC

## 2015-09-15 MED ORDER — CEFAZOLIN SODIUM-DEXTROSE 2-4 GM/100ML-% IV SOLN
INTRAVENOUS | Status: AC
  Filled 2015-09-15: qty 100

## 2015-09-15 MED ORDER — APIXABAN 5 MG PO TABS
5.0000 mg | ORAL_TABLET | Freq: Two times a day (BID) | ORAL | Status: DC
  Administered 2015-09-16: 5 mg via ORAL
  Filled 2015-09-15: qty 1

## 2015-09-15 MED ORDER — CARVEDILOL 12.5 MG PO TABS
12.5000 mg | ORAL_TABLET | Freq: Two times a day (BID) | ORAL | Status: DC
  Administered 2015-09-15 – 2015-09-16 (×2): 12.5 mg via ORAL
  Filled 2015-09-15 (×2): qty 1

## 2015-09-15 MED ORDER — LISINOPRIL 10 MG PO TABS
10.0000 mg | ORAL_TABLET | Freq: Every day | ORAL | Status: DC
  Administered 2015-09-15 – 2015-09-16 (×2): 10 mg via ORAL
  Filled 2015-09-15 (×2): qty 1

## 2015-09-15 SURGICAL SUPPLY — 17 items
CABLE SURGICAL S-101-97-12 (CABLE) ×2 IMPLANT
CATH CPS DIRECT 135 DS2C020 (CATHETERS) ×2 IMPLANT
CATH CPS QUART SUB DS2N026-65 (CATHETERS) ×2 IMPLANT
GUIDEWIRE ANGLED .035X150CM (WIRE) ×2 IMPLANT
ICD UNIFY ASUR CRT CD3357-40Q (ICD Generator) ×2 IMPLANT
KIT ESSENTIALS PG (KITS) ×4 IMPLANT
LEAD DURATA 7122Q-65CM (Lead) ×2 IMPLANT
LEAD TENDRIL MRI 52CM LPA1200M (Lead) ×2 IMPLANT
PAD DEFIB LIFELINK (PAD) ×2 IMPLANT
SHEATH CLASSIC 7F (SHEATH) ×2 IMPLANT
SHEATH CLASSIC 8F (SHEATH) ×2 IMPLANT
SHEATH CLASSIC 9.5F (SHEATH) ×2 IMPLANT
SHEATH WORLEY 9FR 62CM (SHEATH) ×4 IMPLANT
TRAY PACEMAKER INSERTION (PACKS) ×2 IMPLANT
WIRE ACUITY WHISPER EDS 4648 (WIRE) ×2 IMPLANT
WIRE ASAHI SOFT 180CM (WIRE) ×4 IMPLANT
WIRE HI TORQ VERSACORE-J 145CM (WIRE) ×2 IMPLANT

## 2015-09-15 NOTE — H&P (Signed)
Janet Owen has a history of a nonischemic cardiomyopathy who presents to the hospital for ICD implant.  She has a QRS of 160 msec and thus qualifies for a CRT-D.  On exam, regular rhythm, no murmurs, lungs clear.  Risks and benefits of the procedure were explained.  Risks include bleeding, infection, tamponade, pneumothorax.  The patient understands these risks and has agreed to the procedure.  ICD Criteria  Current LVEF:26%. Within 12 months prior to implant: Yes   Heart failure history: Yes, Class II  Cardiomyopathy history: Yes, Non-Ischemic Cardiomyopathy.  Atrial Fibrillation/Atrial Flutter: Yes, Paroxysmal.  Ventricular tachycardia history: No.  Cardiac arrest history: No.  History of syndromes with risk of sudden death: No.  Previous ICD: No.  Current ICD indication: Primary  PPM indication: No.   Class I or II Bradycardia indication present: No  Beta Blocker therapy for 3 or more months: Yes, prescribed.   Ace Inhibitor/ARB therapy for 3 or more months: Yes, prescribed.   Trinidi Toppins Curt Bears, MD 09/15/2015 7:14 AM

## 2015-09-15 NOTE — Discharge Instructions (Signed)
° ° °  Supplemental Discharge Instructions for  Pacemaker/Defibrillator Patients  Activity No heavy lifting or vigorous activity with your left/right arm for 6 to 8 weeks.  Do not raise your left/right arm above your head for one week.  Gradually raise your affected arm as drawn below.           __        09/19/15                       09/20/15                         09/21/15               09/22/15  NO DRIVING for  1 week   ; you may begin driving on   08/04/27  .  WOUND CARE - Keep the wound area clean and dry.  Do not get this area wet for one week. No showers for one week; you may shower on 09/22/15    . - The tape/steri-strips on your wound will fall off; do not pull them off.  No bandage is needed on the site.  DO  NOT apply any creams, oils, or ointments to the wound area. - If you notice any drainage or discharge from the wound, any swelling or bruising at the site, or you develop a fever > 101? F after you are discharged home, call the office at once.  Special Instructions - You are still able to use cellular telephones; use the ear opposite the side where you have your pacemaker/defibrillator.  Avoid carrying your cellular phone near your device. - When traveling through airports, show security personnel your identification card to avoid being screened in the metal detectors.  Ask the security personnel to use the hand wand. - Avoid arc welding equipment, MRI testing (magnetic resonance imaging), TENS units (transcutaneous nerve stimulators).  Call the office for questions about other devices. - Avoid electrical appliances that are in poor condition or are not properly grounded. - Microwave ovens are safe to be near or to operate.  Additional information for defibrillator patients should your device go off: - If your device goes off ONCE and you feel fine afterward, notify the device clinic nurses. - If your device goes off ONCE and you do not feel well afterward, call 911. - If your  device goes off TWICE, call 911. - If your device goes off THREE times in one day, call 911.  DO NOT DRIVE YOURSELF OR A FAMILY MEMBER WITH A DEFIBRILLATOR TO THE HOSPITAL--CALL 911.

## 2015-09-15 NOTE — Discharge Summary (Signed)
ELECTROPHYSIOLOGY PROCEDURE DISCHARGE SUMMARY    Patient ID: Janet Owen,  MRN: [id number], DOB/AGE: 80/22/1935 80 y.o.  Admit date: 09/15/2015 Discharge date: 09/16/2015  Primary Care Physician: Bartholome Bill, MD Primary Cardiologist: Einar Gip Electrophysiologist: Curt Bears  Primary Discharge Diagnosis:  Non ischemic cardiomyopathy, chronic systolic heart failure, and LBBB status post ICD implant this admission with inability to place LV lead 2/2 anatomy  Secondary Discharge Diagnosis:  1.  Hypertension 2.  Diabetes 3.  Paroxysmal atrial fibrillation  No Known Allergies   Procedures This Admission:  1.  Implantation of a STJ dual chamber ICD on 09/15/15 by Dr Curt Bears.  See op note for full details.  LV lead was unable to be placed 2/2 anatomy. There were no immediate post procedure complications. 2.  CXR on 09/16/15 demonstrated no pneumothorax status post device implantation.   Brief HPI: Janet Owen is a 82 y.o. female was referred to electrophysiology in the outpatient setting for consideration of ICD implantation.  Past medical history includes non ischemic cardiomyopathy, chronic systolic heart failure, and LBBB.  The patient has persistent LV dysfunction despite guideline directed therapy.  Risks, benefits, and alternatives to ICD implantation were reviewed with the patient who wished to proceed.   Hospital Course:  The patient was admitted and underwent implantation of a STJ dual chamber ICD with details as outlined above. She was monitored on telemetry overnight which demonstrated sinus rhythm.  Left chest was without hematoma or ecchymosis.  The device was interrogated and found to be functioning normally.  CXR was obtained and demonstrated no pneumothorax status post device implantation.  Wound care, arm mobility, and restrictions were reviewed with the patient.  The patient was examined and considered stable for discharge to home.   This patients  CHA2DS2-VASc Score and unadjusted Ischemic Stroke Rate (% per year) is equal to 4.8 % stroke rate/year from a score of 4 Above score calculated as 1 point each if present [CHF, HTN, DM, Vascular=MI/PAD/Aortic Plaque, Age if 65-74, or Female] Above score calculated as 2 points each if present [Age > 75, or Stroke/TIA/TE]   The patient's discharge medications include an ACE-I (Lisinopril) and beta blocker (Coreg).   Physical Exam: Vitals:   09/15/15 2337 09/16/15 0429 09/16/15 0738 09/16/15 0800  BP: (!) 134/56 (!) 146/83  (!) 158/67  Pulse: (!) 58 62  65  Resp: 16 16    Temp: 98.4 F (36.9 C) 99 F (37.2 C) 98.1 F (36.7 C)   TempSrc: Oral Oral Oral   SpO2: 95% 96%    Weight:  174 lb (78.9 kg)    Height:        GEN- The patient is well appearing, alert and oriented x 3 today.   HEENT: normocephalic, atraumatic; sclera clear, conjunctiva pink; hearing intact; oropharynx clear; neck supple  Lungs- Clear to ausculation bilaterally, normal work of breathing.  No wheezes, rales, rhonchi Heart- Regular rate and rhythm, no murmurs, rubs or gallops  GI- soft, non-tender, non-distended, bowel sounds present  Extremities- no clubbing, cyanosis, or edema; DP/PT/radial pulses 2+ bilaterally MS- no significant deformity or atrophy Skin- warm and dry, no rash or lesion, left chest without hematoma/ecchymosis Psych- euthymic mood, full affect Neuro- strength and sensation are intact   Labs:   Lab Results  Component Value Date   WBC 6.7 09/15/2015   HGB 10.6 (L) 09/15/2015   HCT 35.2 (L) 09/15/2015   MCV 77.9 (L) 09/15/2015   PLT 191 09/15/2015  Recent Labs Lab 09/15/15 0612  NA 139  K 4.8  CL 108  CO2 25  BUN 24*  CREATININE 1.29*  CALCIUM 9.7  GLUCOSE 107*    Discharge Medications:    Medication List    TAKE these medications   amiodarone 200 MG tablet Commonly known as:  PACERONE Take 200 mg by mouth daily.   amLODipine 5 MG tablet Commonly known as:   NORVASC Take 1 tablet (5 mg total) by mouth daily.   atorvastatin 20 MG tablet Commonly known as:  LIPITOR Take 20 mg by mouth daily.   carvedilol 12.5 MG tablet Commonly known as:  COREG Take 12.5 mg by mouth 2 (two) times daily with a meal.   doxylamine (Sleep) 25 MG tablet Commonly known as:  UNISOM Take 25 mg by mouth at bedtime as needed for sleep.   ELIQUIS 5 MG Tabs tablet Generic drug:  apixaban Take 5 mg by mouth 2 (two) times daily.   ezetimibe 10 MG tablet Commonly known as:  ZETIA Take 5 mg by mouth daily. Take one-half tablet (5 mg) by mouth daily   furosemide 20 MG tablet Commonly known as:  LASIX Take 20 mg by mouth daily as needed.   metFORMIN 1000 MG tablet Commonly known as:  GLUCOPHAGE Take 1,000 mg by mouth 2 (two) times daily with a meal.   multivitamin capsule Take 1 capsule by mouth daily.   PROCTOZONE-HC 2.5 % rectal cream Generic drug:  hydrocortisone Place 1 application rectally as needed.       Disposition:  Discharge Instructions    Diet - low sodium heart healthy    Complete by:  As directed   Increase activity slowly    Complete by:  As directed     Follow-up Information    [facility] Follow up on 09/26/2015.   Specialty:  Cardiology Why:  at 10:30AM for wound check  Contact information: [address], [place] [phone]       Circe Chilton Meredith Leeds, MD Follow up on 12/19/2015.   Specialty:  Cardiology Why:  at 10:45AM Contact information: [place] [address] [phone]           Duration of Discharge Encounter: Greater than 30 minutes including physician time.  Signed, Chanetta Marshall, NP 09/16/2015 10:04 AM    I have seen and examined this patient with Chanetta Marshall.  Agree with above, note added to reflect my findings.  On exam, regular rhythm, no mumurs, lungs clear.  Had dual chamber ICD placed for CHF.  Plan for possible  LV lead placement at a later date.  [name] leave pressure dressing until tomorrow.  CXR and interrogation without issue.  [name] plan for discharge with follow up in device clinic in 10 days.    [name] M. [name] MD 09/16/2015 1:02 PM

## 2015-09-15 NOTE — Progress Notes (Signed)
Hematoma to Left side of chest (from ICD placement) is now larger and spreading to left axilla.  Dr. Curt Bears notified and came up to pt bedside with NP (Amber).  Hematoma stable now. Will continue to monitor closely.

## 2015-09-16 ENCOUNTER — Encounter (HOSPITAL_COMMUNITY): Payer: Self-pay | Admitting: Cardiology

## 2015-09-16 ENCOUNTER — Ambulatory Visit (HOSPITAL_COMMUNITY): Payer: Medicare Other

## 2015-09-16 DIAGNOSIS — E119 Type 2 diabetes mellitus without complications: Secondary | ICD-10-CM | POA: Diagnosis not present

## 2015-09-16 DIAGNOSIS — I428 Other cardiomyopathies: Secondary | ICD-10-CM | POA: Diagnosis not present

## 2015-09-16 DIAGNOSIS — I429 Cardiomyopathy, unspecified: Secondary | ICD-10-CM | POA: Diagnosis not present

## 2015-09-16 DIAGNOSIS — I5022 Chronic systolic (congestive) heart failure: Secondary | ICD-10-CM | POA: Diagnosis not present

## 2015-09-16 DIAGNOSIS — Z95810 Presence of automatic (implantable) cardiac defibrillator: Secondary | ICD-10-CM | POA: Diagnosis not present

## 2015-09-16 DIAGNOSIS — I480 Paroxysmal atrial fibrillation: Secondary | ICD-10-CM | POA: Diagnosis not present

## 2015-09-16 DIAGNOSIS — I255 Ischemic cardiomyopathy: Secondary | ICD-10-CM | POA: Diagnosis not present

## 2015-09-16 DIAGNOSIS — I110 Hypertensive heart disease with heart failure: Secondary | ICD-10-CM | POA: Diagnosis not present

## 2015-09-16 MED FILL — Gentamicin Sulfate Inj 40 MG/ML: INTRAMUSCULAR | Qty: 2 | Status: AC

## 2015-09-16 MED FILL — Sodium Chloride Irrigation Soln 0.9%: Qty: 500 | Status: AC

## 2015-09-19 NOTE — Progress Notes (Signed)
Pt previously on ARB, but this was discontinued 2/2 hyperkalemia.  Ability to resume will be assessed at follow up  Chanetta Marshall, NP 09/19/2015 8:23 AM

## 2015-09-22 NOTE — Addendum Note (Signed)
Addended by: Freada Bergeron on: 09/22/2015 02:55 PM   Modules accepted: Orders

## 2015-09-26 ENCOUNTER — Encounter: Payer: Self-pay | Admitting: Cardiology

## 2015-09-26 ENCOUNTER — Ambulatory Visit (INDEPENDENT_AMBULATORY_CARE_PROVIDER_SITE_OTHER): Payer: Medicare Other | Admitting: *Deleted

## 2015-09-26 DIAGNOSIS — I5022 Chronic systolic (congestive) heart failure: Secondary | ICD-10-CM | POA: Diagnosis not present

## 2015-09-26 DIAGNOSIS — I429 Cardiomyopathy, unspecified: Secondary | ICD-10-CM

## 2015-09-26 DIAGNOSIS — Z95810 Presence of automatic (implantable) cardiac defibrillator: Secondary | ICD-10-CM

## 2015-09-26 DIAGNOSIS — I428 Other cardiomyopathies: Secondary | ICD-10-CM

## 2015-09-26 LAB — CUP PACEART INCLINIC DEVICE CHECK
Battery Remaining Longevity: 111.6
Brady Statistic RA Percent Paced: 0 %
Brady Statistic RV Percent Paced: 0 %
HighPow Impedance: 48.375
Implantable Lead Implant Date: 20170803
Implantable Lead Implant Date: 20170803
Implantable Lead Location: 753859
Lead Channel Impedance Value: 437.5 Ohm
Lead Channel Impedance Value: 462.5 Ohm
Lead Channel Pacing Threshold Pulse Width: 0.5 ms
Lead Channel Sensing Intrinsic Amplitude: 1 mV
Lead Channel Setting Pacing Amplitude: 3.5 V
Lead Channel Setting Pacing Pulse Width: 0.5 ms
MDC IDC LEAD LOCATION: 753860
MDC IDC MSMT LEADCHNL RA PACING THRESHOLD AMPLITUDE: 1 V
MDC IDC MSMT LEADCHNL RV PACING THRESHOLD AMPLITUDE: 0.75 V
MDC IDC MSMT LEADCHNL RV PACING THRESHOLD PULSEWIDTH: 0.5 ms
MDC IDC MSMT LEADCHNL RV SENSING INTR AMPL: 11.7 mV
MDC IDC PG SERIAL: 7348146
MDC IDC SESS DTM: 20170814124948
MDC IDC SET LEADCHNL RA PACING AMPLITUDE: 3.5 V
MDC IDC SET LEADCHNL RV SENSING SENSITIVITY: 0.5 mV

## 2015-09-26 NOTE — Progress Notes (Signed)
Wound check appointment. Steri-strips removed. Hematoma present over device site, ecchymosis extending distally towards left breast. Site firm to palpation. Incision edges approximated with exception of R lateral incision where small opening noted. No drainage noted. WC saw patient, recommended applying Steri-strips over unapproximated area and holding Eliquis x5 days. Wound recheck with device clinic in 9 days (WC to see). Patient verbalizes understanding of instructions and is aware to call with signs/symptoms of infection.  Normal device function. Thresholds, sensing, and impedances consistent with implant measurements. Device programmed at 3.5V for extra safety margin until 3 month visit. Histogram distribution appropriate for patient and level of activity. No AT/AF or ventricular arrhythmias noted. Patient educated about wound care, arm mobility, lifting restrictions, shock plan. ROV with device clinic on 10/05/15 for wound recheck and with WC on 12/19/15.

## 2015-09-26 NOTE — Patient Instructions (Signed)
Hold (stop taking) your Eliquis through Saturday, 10/01/15.  Restart on Sunday, 10/02/15.   Call the device clinic at [phone] if you develop signs/symptoms of infection, including redness, drainage, or fever/chills.

## 2015-09-28 ENCOUNTER — Telehealth: Payer: Self-pay | Admitting: Cardiology

## 2015-09-28 NOTE — Telephone Encounter (Signed)
Attempted to call pt about her home monitor not updating in at least 8 days. No answer and unable to leave a message.

## 2015-10-05 ENCOUNTER — Ambulatory Visit (INDEPENDENT_AMBULATORY_CARE_PROVIDER_SITE_OTHER): Payer: Medicare Other | Admitting: *Deleted

## 2015-10-05 DIAGNOSIS — Z95810 Presence of automatic (implantable) cardiac defibrillator: Secondary | ICD-10-CM | POA: Diagnosis not present

## 2015-10-05 DIAGNOSIS — I5042 Chronic combined systolic (congestive) and diastolic (congestive) heart failure: Secondary | ICD-10-CM | POA: Diagnosis not present

## 2015-10-05 DIAGNOSIS — I480 Paroxysmal atrial fibrillation: Secondary | ICD-10-CM | POA: Diagnosis not present

## 2015-10-05 DIAGNOSIS — I447 Left bundle-branch block, unspecified: Secondary | ICD-10-CM | POA: Diagnosis not present

## 2015-10-05 NOTE — Progress Notes (Signed)
Wound re-check appointment.  Steri-strips removed, incision now healed.  Patient held Eliquis x5 days and resumed as instructed.  Site softer to palpation, ecchymosis now greenish in color.  Dr. Curt Bears saw patient, recommended continuing current plan of care, including Eliquis, and returning for wound re-check in 2 weeks.  Patient agreeable to appointment on 10/19/15 at 10:30am.  She is aware to call in the interim with signs/symptoms of infection including drainage, fever, or chills.

## 2015-10-19 ENCOUNTER — Encounter: Payer: Self-pay | Admitting: Surgery

## 2015-10-19 ENCOUNTER — Ambulatory Visit (INDEPENDENT_AMBULATORY_CARE_PROVIDER_SITE_OTHER): Payer: Medicare Other | Admitting: *Deleted

## 2015-10-19 ENCOUNTER — Institutional Professional Consult (permissible substitution) (INDEPENDENT_AMBULATORY_CARE_PROVIDER_SITE_OTHER): Payer: Medicare Other | Admitting: Surgery

## 2015-10-19 VITALS — BP 130/67 | HR 62 | Resp 16 | Ht 67.0 in | Wt 174.0 lb

## 2015-10-19 DIAGNOSIS — I5042 Chronic combined systolic (congestive) and diastolic (congestive) heart failure: Secondary | ICD-10-CM

## 2015-10-19 DIAGNOSIS — I429 Cardiomyopathy, unspecified: Secondary | ICD-10-CM

## 2015-10-19 DIAGNOSIS — I428 Other cardiomyopathies: Secondary | ICD-10-CM

## 2015-10-19 DIAGNOSIS — Z95810 Presence of automatic (implantable) cardiac defibrillator: Secondary | ICD-10-CM

## 2015-10-19 DIAGNOSIS — I480 Paroxysmal atrial fibrillation: Secondary | ICD-10-CM

## 2015-10-19 NOTE — Progress Notes (Signed)
Wound recheck appointment.  Incision well healed.  Site soft to palpation, ecchymosis resolved.  ROV with Dr. Curt Bears on 12/19/15 as scheduled.

## 2015-10-21 ENCOUNTER — Encounter: Payer: Self-pay | Admitting: Surgery

## 2015-10-21 NOTE — Progress Notes (Signed)
Cardiothoracic Surgery Consultation  PCP is Bartholome Bill, MD Referring Provider is Adrian Prows, MD  Chief Complaint  Patient presents with  . Congestive Heart Failure    Surgical eval for placement of LV [place] for BI-V, ECHO 06/12/15    HPI:  The patient is an 82 year old woman with non-ischemic cardiomyopathy and an EF of 26% who recently had a BiV ICD inserted on 09/15/2015 but the coronary sinus LV lead could not be placed due to anatomy. She presented in 2015 with shortness of breath and was noted to be in atrial fibrillation with a LBBB. She had a cardiac cath in 2015 showing no significant coronary disease with an EF of 35%. She was seen by Dr. Einar Gip in May 2017 and was back in atrial fibrillation with RVR and was started on amiodarone. She felt better initially but then had return of fatigue and exertional shortness of breath.  She did have some hematoma over the device when she returned for her postop wound check. Her Eliquis was held for 5 days and she returned 9 days later for reevaluation and the incision looked ok with some resolving ecchymosis. She has had no fever or chills.  Past Medical History:  Diagnosis Date  . Atrial fibrillation ([place])   . DM (diabetes mellitus) ([place])   . Heart failure ([place])   . Hypertension     Past Surgical History:  Procedure Laterality Date  . APPENDECTOMY    . BACK SURGERY    . EP IMPLANTABLE DEVICE N/A 09/15/2015   Procedure: BiV ICD Insertion CRT-D;  Surgeon: Will Meredith Leeds, MD;  Location: [facility];  Service: Cardiovascular;  Laterality: N/A;  . PARTIAL HYSTERECTOMY    . REPLACEMENT TOTAL KNEE    . VESICOVAGINAL FISTULA CLOSURE W/ TAH      Family History  Problem Relation Age of Onset  . Asthma Mother   . Pancreatitis Mother   . Alcohol abuse Father     Social History Social History  Substance Use Topics  . Smoking status: Never Smoker  . Smokeless tobacco: Never Used  . Alcohol use No    Current  Outpatient Prescriptions  Medication Sig Dispense Refill  . amiodarone (PACERONE) 200 MG tablet Take 100 mg by mouth daily.   0  . amLODipine (NORVASC) 5 MG tablet Take 1 tablet (5 mg total) by mouth daily. 30 tablet 6  . apixaban (ELIQUIS) 5 MG TABS tablet Take 5 mg by mouth 2 (two) times daily.    Marland Kitchen atorvastatin (LIPITOR) 20 MG tablet Take 20 mg by mouth daily.    . carvedilol (COREG) 12.5 MG tablet Take 12.5 mg by mouth 2 (two) times daily with a meal.    . doxylamine, Sleep, (UNISOM) 25 MG tablet Take 25 mg by mouth at bedtime as needed for sleep.    Marland Kitchen ezetimibe (ZETIA) 10 MG tablet Take 5 mg by mouth daily. Take one-half tablet (5 mg) by mouth daily    . furosemide (LASIX) 20 MG tablet Take 20 mg by mouth daily as needed.     . metFORMIN (GLUCOPHAGE) 1000 MG tablet Take 1,000 mg by mouth 2 (two) times daily with a meal.    . Multiple Vitamin (MULTIVITAMIN) capsule Take 1 capsule by mouth daily as needed.     Marland Kitchen PROCTOZONE-HC 2.5 % rectal cream Place 1 application rectally as needed.   2  . spironolactone (ALDACTONE) 25 MG tablet Take 12.5 mg by mouth every morning.  No current facility-administered medications for this visit.     No Known Allergies  Review of Systems  Constitutional: Positive for activity change and fatigue. Negative for appetite change, chills, diaphoresis, fever and unexpected weight change.  HENT: Negative.   Eyes: Negative.   Respiratory: Positive for shortness of breath.        With exertion  Cardiovascular: Positive for palpitations and leg swelling. Negative for chest pain.  Gastrointestinal: Negative.   Endocrine: Negative.   Genitourinary: Negative.   Musculoskeletal: Positive for arthralgias and gait problem.  Skin: Negative.   Allergic/Immunologic: Negative.   Hematological: Negative.   Psychiatric/Behavioral: Negative.     BP 130/67   Pulse 62   Resp 16   Ht '5\' 7"'$  (1.702 m)   Wt 174 lb (78.9 kg)   SpO2 98% Comment: RA  BMI 27.25 kg/m    Physical Exam  Constitutional: She is oriented to person, place, and time. She appears well-developed and well-nourished. No distress.  HENT:  Head: Normocephalic and atraumatic.  Mouth/Throat: Oropharynx is clear and moist.  Eyes: EOM are normal. Pupils are equal, round, and reactive to light.  Neck: Normal range of motion. Neck supple. No JVD present. No thyromegaly present.  Cardiovascular: Normal rate, regular rhythm, normal heart sounds and intact distal pulses.   No murmur heard. Pulmonary/Chest: Effort normal and breath sounds normal. No respiratory distress. She has no wheezes. She has no rales.  BiV generator in subcutaneous pocket on left anterior chest. The incision is healing well with no erythema. There is minimal swelling around the generator.  Abdominal: Soft. Bowel sounds are normal. She exhibits no distension and no mass. There is no tenderness.  Musculoskeletal: Normal range of motion.  Mild ankle edema  Lymphadenopathy:    She has no cervical adenopathy.  Neurological: She is alert and oriented to person, place, and time. She has normal strength. A cranial nerve deficit is present. No sensory deficit.  Skin: Skin is warm and dry.  Psychiatric: She has a normal mood and affect.     Diagnostic Tests:  None  Impression:  She has non-ischemic cardiomyopathy with LBBB morphology with a QRS of 152m with significant exertional fatigue and shortness of breath that is lifestyle-limiting. She has a BiV ICD generator but an LV lead could not be placed percutaneously. I agree that an LV epicardial lead via left thoracotomy is indicated to try to improve her LV contractility and symptoms. I discussed the surgery with her including alternatives, benefits and risks including but not limited to bleeding, infection, injury to the heart, system malfunction requiring revision and the possibility that it may not improve her symptoms. She understands and agrees to  proceed.  Plan:  She will think about the timing of this and will call [facility] to schedule left thoracotomy for insertion of LV epicardial pacing leads and revision of her BiV pacing system.  I spent 60 minutes performing this consultation and > 50% of this time was spent face to face counseling and coordinating the care of this patient's non-ischemic cardiomyopathy.   BGaye Pollack MD [facility] ([phone]

## 2015-11-01 ENCOUNTER — Encounter: Payer: Self-pay | Admitting: Internal Medicine

## 2015-11-01 ENCOUNTER — Ambulatory Visit (INDEPENDENT_AMBULATORY_CARE_PROVIDER_SITE_OTHER): Payer: Medicare Other | Admitting: Internal Medicine

## 2015-11-01 VITALS — BP 124/66 | HR 61 | Ht 67.0 in

## 2015-11-01 DIAGNOSIS — I5022 Chronic systolic (congestive) heart failure: Secondary | ICD-10-CM | POA: Diagnosis not present

## 2015-11-01 DIAGNOSIS — I429 Cardiomyopathy, unspecified: Secondary | ICD-10-CM | POA: Diagnosis not present

## 2015-11-01 DIAGNOSIS — I428 Other cardiomyopathies: Secondary | ICD-10-CM

## 2015-11-01 DIAGNOSIS — Z95810 Presence of automatic (implantable) cardiac defibrillator: Secondary | ICD-10-CM

## 2015-11-01 LAB — CBC WITH DIFFERENTIAL/PLATELET
BASOS PCT: 0 %
Basophils Absolute: 0 cells/uL (ref 0–200)
EOS ABS: 380 {cells}/uL (ref 15–500)
Eosinophils Relative: 5 %
HCT: 35.4 % (ref 35.0–45.0)
Hemoglobin: 11.3 g/dL — ABNORMAL LOW (ref 11.7–15.5)
LYMPHS PCT: 30 %
Lymphs Abs: 2280 cells/uL (ref 850–3900)
MCH: 23.8 pg — ABNORMAL LOW (ref 27.0–33.0)
MCHC: 31.9 g/dL — ABNORMAL LOW (ref 32.0–36.0)
MCV: 74.5 fL — AB (ref 80.0–100.0)
MONOS PCT: 9 %
MPV: 10.4 fL (ref 7.5–12.5)
Monocytes Absolute: 684 cells/uL (ref 200–950)
Neutro Abs: 4256 cells/uL (ref 1500–7800)
Neutrophils Relative %: 56 %
PLATELETS: 221 10*3/uL (ref 140–400)
RBC: 4.75 MIL/uL (ref 3.80–5.10)
RDW: 17.3 % — AB (ref 11.0–15.0)
WBC: 7.6 10*3/uL (ref 3.8–10.8)

## 2015-11-01 NOTE — Patient Instructions (Addendum)
Medication Instructions:  Your physician recommends that you continue on your current medications as directed. Please refer to the Current Medication list given to you today.   Labwork: TODAY: BMET/CBC   Testing/Procedures: Your physician has recommended that you have a bi-v ICD upgrade.  This procedure is done in the hospital and usually requires and overnight stay.   Will need to hold Eliquis for 2 days prior to procedure. Last dose on 11/08/15  Please use CHG Soap night prior to procedure and morning of procedure  Please report to [facility] at 6:30 AM 11/11/15  Nothing to eat or drink after midnight prior to procedure  Do not take any medication prior to procedure  Plan 1 night stay     Follow-Up: Your physician recommends that you schedule a follow-up appointment 10-14 days from 11/11/15 in [facility] for wound check and in March 2018 with Dr. Lovena Le.     If you need a refill on your cardiac medications before your next appointment, please call your pharmacy.

## 2015-11-01 NOTE — Progress Notes (Signed)
HPI Janet Owen is referred today by Dr. Curt Owen and Janet Owen to consider upgrade of her DDD ICD. The patient has a longstanding DCM, chronic systolic heart failure, and LBBB and has been on guideline directed medical therapy. She underwent attempted BiV ICD implant but her LV lead could not be placed due to tortuousity. She has seen Dr. Cyndia Owen regarding an epicardial lead. She is here to discuss an initial attempt by me.   No Known Allergies   Current Outpatient Prescriptions  Medication Sig Dispense Refill  . amiodarone (PACERONE) 200 MG tablet Take 100 mg by mouth daily.   0  . amLODipine (NORVASC) 5 MG tablet Take 1 tablet (5 mg total) by mouth daily. 30 tablet 6  . apixaban (ELIQUIS) 5 MG TABS tablet Take 5 mg by mouth 2 (two) times daily.    Marland Kitchen atorvastatin (LIPITOR) 20 MG tablet Take 20 mg by mouth daily.    . carvedilol (COREG) 12.5 MG tablet Take 12.5 mg by mouth 2 (two) times daily with a meal.    . doxylamine, Sleep, (UNISOM) 25 MG tablet Take 25 mg by mouth at bedtime.     Marland Kitchen ezetimibe (ZETIA) 10 MG tablet Take one-half tablet (5 mg) by mouth daily    . furosemide (LASIX) 20 MG tablet Take 20 mg by mouth daily as needed (swelling).     . metFORMIN (GLUCOPHAGE) 1000 MG tablet Take 1,000 mg by mouth 2 (two) times daily with a meal.    . Multiple Vitamin (MULTIVITAMIN) capsule Take 1 capsule by mouth daily as needed (Vitamin supplement).     Marland Kitchen PROCTOZONE-HC 2.5 % rectal cream Place 1 application rectally as directed. As needed  2  . spironolactone (ALDACTONE) 25 MG tablet Take 12.5 mg by mouth every morning.     No current facility-administered medications for this visit.      Past Medical History:  Diagnosis Date  . Atrial fibrillation ([place])   . DM (diabetes mellitus) ([place])   . Heart failure ([place])   . Hypertension     ROS:   All systems reviewed and negative except as noted in the HPI.   Past Surgical History:  Procedure Laterality Date  . APPENDECTOMY      . BACK SURGERY    . EP IMPLANTABLE DEVICE N/A 09/15/2015   Procedure: BiV ICD Insertion CRT-D;  Surgeon: Janet Meredith Leeds, MD;  Location: [facility];  Service: Cardiovascular;  Laterality: N/A;  . PARTIAL HYSTERECTOMY    . REPLACEMENT TOTAL KNEE    . VESICOVAGINAL FISTULA CLOSURE W/ TAH       Family History  Problem Relation Age of Onset  . Asthma Mother   . Pancreatitis Mother   . Alcohol abuse Father      Social History   Social History  . Marital status: Married    Spouse name: N/A  . Number of children: N/A  . Years of education: N/A   Occupational History  . Not on file.   Social History Main Topics  . Smoking status: Never Smoker  . Smokeless tobacco: Never Used  . Alcohol use No  . Drug use: No  . Sexual activity: Not on file   Other Topics Concern  . Not on file   Social History Narrative  . No narrative on file     BP 124/66   Pulse 61   Ht '5\' 7"'$  (1.702 m)   Physical Exam:  Well appearing NAD HEENT: Unremarkable Neck:  No JVD, no thyromegally Lymphatics:  No adenopathy Back:  No CVA tenderness Lungs:  Clear HEART:  Regular rate rhythm, no murmurs, no rubs, no clicks Abd:  soft, positive bowel sounds, no organomegally, no rebound, no guarding Ext:  2 plus pulses, no edema, no cyanosis, no clubbing Skin:  No rashes no nodules Neuro:  CN II through XII intact, motor grossly intact  EKG - nsr with LBBB  Assess/Plan: 1. Chronic systolic heart failure - her symptoms remain class 3. I have offered her an option to undergo another attempt at LV endocardial insertion. She is considering her options and Janet schedule as she wishes. I spent over 45 minutes with over 50% discussing the procedure with the patient. [name]

## 2015-11-02 LAB — BASIC METABOLIC PANEL
BUN: 31 mg/dL — AB (ref 7–25)
CALCIUM: 9.9 mg/dL (ref 8.6–10.4)
CO2: 23 mmol/L (ref 20–31)
CREATININE: 1.42 mg/dL — AB (ref 0.60–0.88)
Chloride: 105 mmol/L (ref 98–110)
Glucose, Bld: 93 mg/dL (ref 65–99)
Potassium: 5.6 mmol/L — ABNORMAL HIGH (ref 3.5–5.3)
Sodium: 138 mmol/L (ref 135–146)

## 2015-11-04 ENCOUNTER — Other Ambulatory Visit: Payer: Self-pay | Admitting: *Deleted

## 2015-11-11 ENCOUNTER — Encounter (HOSPITAL_COMMUNITY)
Admission: RE | Disposition: A | Discharge status: Home / Self Care | Payer: Self-pay | Source: Ambulatory Visit | Attending: Internal Medicine

## 2015-11-11 ENCOUNTER — Telehealth: Payer: Self-pay | Admitting: Physician Assistant

## 2015-11-11 ENCOUNTER — Ambulatory Visit (HOSPITAL_COMMUNITY)
Admission: RE | Admit: 2015-11-11 | Discharge: 2015-11-12 | Disposition: A | Discharge status: Home / Self Care | Payer: Medicare Other | Source: Ambulatory Visit | Attending: Internal Medicine | Admitting: Internal Medicine

## 2015-11-11 ENCOUNTER — Encounter (HOSPITAL_COMMUNITY): Payer: Self-pay | Admitting: Internal Medicine

## 2015-11-11 DIAGNOSIS — Z7984 Long term (current) use of oral hypoglycemic drugs: Secondary | ICD-10-CM | POA: Insufficient documentation

## 2015-11-11 DIAGNOSIS — I5022 Chronic systolic (congestive) heart failure: Secondary | ICD-10-CM | POA: Diagnosis not present

## 2015-11-11 DIAGNOSIS — Z7901 Long term (current) use of anticoagulants: Secondary | ICD-10-CM | POA: Insufficient documentation

## 2015-11-11 DIAGNOSIS — I110 Hypertensive heart disease with heart failure: Secondary | ICD-10-CM | POA: Insufficient documentation

## 2015-11-11 DIAGNOSIS — E119 Type 2 diabetes mellitus without complications: Secondary | ICD-10-CM | POA: Insufficient documentation

## 2015-11-11 DIAGNOSIS — Z95810 Presence of automatic (implantable) cardiac defibrillator: Secondary | ICD-10-CM

## 2015-11-11 DIAGNOSIS — I447 Left bundle-branch block, unspecified: Secondary | ICD-10-CM | POA: Diagnosis not present

## 2015-11-11 DIAGNOSIS — I480 Paroxysmal atrial fibrillation: Secondary | ICD-10-CM | POA: Insufficient documentation

## 2015-11-11 DIAGNOSIS — I428 Other cardiomyopathies: Secondary | ICD-10-CM | POA: Diagnosis not present

## 2015-11-11 DIAGNOSIS — Z4502 Encounter for adjustment and management of automatic implantable cardiac defibrillator: Secondary | ICD-10-CM | POA: Diagnosis not present

## 2015-11-11 HISTORY — PX: EP IMPLANTABLE DEVICE: SHX172B

## 2015-11-11 LAB — GLUCOSE, CAPILLARY
GLUCOSE-CAPILLARY: 88 mg/dL (ref 65–99)
Glucose-Capillary: 127 mg/dL — ABNORMAL HIGH (ref 65–99)
Glucose-Capillary: 120 mg/dL — ABNORMAL HIGH (ref 65–99)
Glucose-Capillary: 182 mg/dL — ABNORMAL HIGH (ref 65–99)

## 2015-11-11 LAB — BASIC METABOLIC PANEL
Anion gap: 6 (ref 5–15)
BUN: 26 mg/dL — AB (ref 6–20)
CHLORIDE: 108 mmol/L (ref 101–111)
CO2: 25 mmol/L (ref 22–32)
CREATININE: 1.32 mg/dL — AB (ref 0.44–1.00)
Calcium: 9.7 mg/dL (ref 8.9–10.3)
GFR calc Af Amer: 43 mL/min — ABNORMAL LOW (ref >60)
GFR calc non Af Amer: 37 mL/min — ABNORMAL LOW (ref >60)
GLUCOSE: 145 mg/dL — AB (ref 65–99)
Potassium: 4.8 mmol/L (ref 3.5–5.1)
Sodium: 139 mmol/L (ref 135–145)

## 2015-11-11 LAB — SURGICAL PCR SCREEN
MRSA, PCR: NEGATIVE
Staphylococcus aureus: NEGATIVE

## 2015-11-11 SURGERY — BIV UPGRADE

## 2015-11-11 MED ORDER — CEFAZOLIN SODIUM-DEXTROSE 2-4 GM/100ML-% IV SOLN
2.0000 g | INTRAVENOUS | Status: AC
  Administered 2015-11-11: 2 g via INTRAVENOUS

## 2015-11-11 MED ORDER — METFORMIN HCL 500 MG PO TABS
1000.0000 mg | ORAL_TABLET | Freq: Two times a day (BID) | ORAL | Status: DC
  Administered 2015-11-11: 1000 mg via ORAL
  Filled 2015-11-11: qty 2

## 2015-11-11 MED ORDER — CHLORHEXIDINE GLUCONATE 4 % EX LIQD: 60.0000 mL | Freq: Once | CUTANEOUS | Status: DC

## 2015-11-11 MED ORDER — DOXYLAMINE SUCCINATE (SLEEP) 25 MG PO TABS
25.0000 mg | ORAL_TABLET | Freq: Every day | ORAL | Status: DC
  Filled 2015-11-11: qty 1

## 2015-11-11 MED ORDER — ACETAMINOPHEN 325 MG PO TABS: 325.0000 mg | ORAL_TABLET | ORAL | Status: DC | PRN

## 2015-11-11 MED ORDER — HEPARIN SODIUM (PORCINE) 1000 UNIT/ML IJ SOLN
INTRAMUSCULAR | Status: DC | PRN
  Administered 2015-11-11: 500 [IU] via INTRAVENOUS

## 2015-11-11 MED ORDER — INFLUENZA VAC SPLIT QUAD 0.5 ML IM SUSY
0.5000 mL | PREFILLED_SYRINGE | INTRAMUSCULAR | Status: AC
  Administered 2015-11-12: 0.5 mL via INTRAMUSCULAR
  Filled 2015-11-11: qty 0.5

## 2015-11-11 MED ORDER — CARVEDILOL 12.5 MG PO TABS
12.5000 mg | ORAL_TABLET | Freq: Two times a day (BID) | ORAL | Status: DC
  Administered 2015-11-11: 12.5 mg via ORAL
  Filled 2015-11-11: qty 1

## 2015-11-11 MED ORDER — IOPAMIDOL (ISOVUE-370) INJECTION 76%
INTRAVENOUS | Status: AC
  Filled 2015-11-11: qty 50

## 2015-11-11 MED ORDER — ZOLPIDEM TARTRATE 5 MG PO TABS
5.0000 mg | ORAL_TABLET | Freq: Every evening | ORAL | Status: DC | PRN
  Filled 2015-11-11: qty 1

## 2015-11-11 MED ORDER — AMIODARONE HCL 100 MG PO TABS
100.0000 mg | ORAL_TABLET | Freq: Every day | ORAL | Status: DC
  Administered 2015-11-11 – 2015-11-12 (×2): 100 mg via ORAL
  Filled 2015-11-11 (×2): qty 1

## 2015-11-11 MED ORDER — SODIUM CHLORIDE 0.9 % IR SOLN
Status: AC
  Filled 2015-11-11: qty 2

## 2015-11-11 MED ORDER — LIDOCAINE HCL (PF) 1 % IJ SOLN
INTRAMUSCULAR | Status: AC
  Filled 2015-11-11: qty 60

## 2015-11-11 MED ORDER — CEFAZOLIN IN D5W 1 GM/50ML IV SOLN
1.0000 g | Freq: Four times a day (QID) | INTRAVENOUS | Status: AC
Start: 2015-11-11 — End: 2015-11-12
  Administered 2015-11-11 – 2015-11-12 (×3): 1 g via INTRAVENOUS
  Filled 2015-11-11 (×3): qty 50

## 2015-11-11 MED ORDER — MUPIROCIN 2 % EX OINT
TOPICAL_OINTMENT | CUTANEOUS | Status: AC
  Filled 2015-11-11: qty 22

## 2015-11-11 MED ORDER — IOPAMIDOL (ISOVUE-370) INJECTION 76%
INTRAVENOUS | Status: DC | PRN
  Administered 2015-11-11: 20 mL via INTRAVENOUS

## 2015-11-11 MED ORDER — INSULIN ASPART 100 UNIT/ML ~~LOC~~ SOLN: 0.0000 [IU] | Freq: Three times a day (TID) | SUBCUTANEOUS | Status: DC

## 2015-11-11 MED ORDER — MIDAZOLAM HCL 5 MG/5ML IJ SOLN
INTRAMUSCULAR | Status: AC
  Filled 2015-11-11: qty 5

## 2015-11-11 MED ORDER — ONDANSETRON HCL 4 MG/2ML IJ SOLN: 4.0000 mg | Freq: Four times a day (QID) | INTRAMUSCULAR | Status: DC | PRN

## 2015-11-11 MED ORDER — FENTANYL CITRATE (PF) 100 MCG/2ML IJ SOLN
INTRAMUSCULAR | Status: AC
  Filled 2015-11-11: qty 2

## 2015-11-11 MED ORDER — AMLODIPINE BESYLATE 5 MG PO TABS
5.0000 mg | ORAL_TABLET | Freq: Every day | ORAL | Status: DC
  Administered 2015-11-11 – 2015-11-12 (×2): 5 mg via ORAL
  Filled 2015-11-11 (×2): qty 1

## 2015-11-11 MED ORDER — GENTAMICIN SULFATE 40 MG/ML IJ SOLN
80.0000 mg | INTRAMUSCULAR | Status: AC
  Administered 2015-11-11: 80 mg

## 2015-11-11 MED ORDER — FENTANYL CITRATE (PF) 100 MCG/2ML IJ SOLN
INTRAMUSCULAR | Status: DC | PRN
  Administered 2015-11-11: 12.5 ug via INTRAVENOUS
  Administered 2015-11-11: 25 ug via INTRAVENOUS
  Administered 2015-11-11 (×2): 12.5 ug via INTRAVENOUS
  Administered 2015-11-11: 25 ug via INTRAVENOUS

## 2015-11-11 MED ORDER — MULTIVITAMINS PO CAPS: 1.0000 | ORAL_CAPSULE | Freq: Every day | ORAL | Status: DC | PRN

## 2015-11-11 MED ORDER — INSULIN ASPART 100 UNIT/ML ~~LOC~~ SOLN
0.0000 [IU] | Freq: Three times a day (TID) | SUBCUTANEOUS | Status: DC
  Administered 2015-11-11: 2 [IU] via SUBCUTANEOUS
  Administered 2015-11-12: 1 [IU] via SUBCUTANEOUS

## 2015-11-11 MED ORDER — APIXABAN 5 MG PO TABS
5.0000 mg | ORAL_TABLET | Freq: Two times a day (BID) | ORAL | Status: DC
  Administered 2015-11-11 – 2015-11-12 (×3): 5 mg via ORAL
  Filled 2015-11-11 (×3): qty 1

## 2015-11-11 MED ORDER — HEPARIN (PORCINE) IN NACL 2-0.9 UNIT/ML-% IJ SOLN
INTRAMUSCULAR | Status: AC
  Filled 2015-11-11: qty 500

## 2015-11-11 MED ORDER — HYDROCORTISONE 2.5 % RE CREA
1.0000 "application " | TOPICAL_CREAM | RECTAL | Status: DC | PRN
  Filled 2015-11-11: qty 28.35

## 2015-11-11 MED ORDER — SODIUM CHLORIDE 0.9 % IV SOLN
INTRAVENOUS | Status: DC
  Administered 2015-11-11: 07:00:00 via INTRAVENOUS

## 2015-11-11 MED ORDER — MIDAZOLAM HCL 5 MG/5ML IJ SOLN
INTRAMUSCULAR | Status: DC | PRN
  Administered 2015-11-11 (×6): 1 mg via INTRAVENOUS
  Administered 2015-11-11: 2 mg via INTRAVENOUS

## 2015-11-11 MED ORDER — LIDOCAINE HCL (PF) 1 % IJ SOLN
INTRAMUSCULAR | Status: DC | PRN
  Administered 2015-11-11: 45 mL

## 2015-11-11 MED ORDER — ATORVASTATIN CALCIUM 20 MG PO TABS
20.0000 mg | ORAL_TABLET | Freq: Every day | ORAL | Status: DC
  Administered 2015-11-11 – 2015-11-12 (×2): 20 mg via ORAL
  Filled 2015-11-11 (×3): qty 1

## 2015-11-11 MED ORDER — CEFAZOLIN SODIUM-DEXTROSE 2-4 GM/100ML-% IV SOLN
INTRAVENOUS | Status: AC
  Filled 2015-11-11: qty 100

## 2015-11-11 MED ORDER — ADULT MULTIVITAMIN W/MINERALS CH
1.0000 | ORAL_TABLET | Freq: Every day | ORAL | Status: DC
  Administered 2015-11-12: 1 via ORAL
  Filled 2015-11-11 (×3): qty 1

## 2015-11-11 MED ORDER — MUPIROCIN 2 % EX OINT
TOPICAL_OINTMENT | Freq: Once | CUTANEOUS | Status: AC
  Administered 2015-11-11: 1 via NASAL

## 2015-11-11 SURGICAL SUPPLY — 10 items
CABLE SURGICAL S-101-97-12 (CABLE) ×3 IMPLANT
CATH CPS DIRECT 135 DS2C020 (CATHETERS) ×3 IMPLANT
CATH HEX JOS 2-5-2 65CM 6F REP (CATHETERS) ×3 IMPLANT
CPS IMPLANT KIT 410190 (MISCELLANEOUS) ×3 IMPLANT
LEAD ATTAIN ABILITY 4296-88CM (Lead) ×3 IMPLANT
PAD DEFIB LIFELINK (PAD) ×3 IMPLANT
SHEATH CLASSIC 9.5F (SHEATH) ×3 IMPLANT
SLITTER UNIVERSAL DS2A003 (MISCELLANEOUS) ×3 IMPLANT
TRAY PACEMAKER INSERTION (PACKS) ×3 IMPLANT
WIRE LUGE 182CM (WIRE) ×3 IMPLANT

## 2015-11-11 NOTE — Telephone Encounter (Signed)
Erroneous encounter, opened in error.

## 2015-11-11 NOTE — Discharge Summary (Addendum)
ELECTROPHYSIOLOGY PROCEDURE DISCHARGE SUMMARY    Patient ID: Janet Owen,  MRN: [id number], DOB/AGE: Dec 04, 1933 82 y.o.  Admit date: 11/11/2015 Discharge date: 11/12/15  Primary Care Physician: Bartholome Bill, MD  Primary Cardiologist: Dr. Einar Gip Electrophysiologist: Dr. Curt Bears  Primary Discharge Diagnosis:  1. NICM  Secondary Discharge Diagnosis:  1. PAFib     CHA2DS2Vasc is at least 5, on Eliquis 2. DM 3. HTN  Not on File   Procedures This Admission:  1.  Implantation/upgrade of a dual chamber ICD to CRT-ICD on 11/11/15 by Dr Lovena Le.  The patient received a Medtronic bipolar left ventricular pacing lead, serial number [id number].  There were no immediate post procedure complications. 2.  CXR on 11/12/15  demonstrated no pneumothorax status post device implantation   Brief HPI: Janet Owen is a 82 y.o. female was referred to for upgrade of her dual chamber ICD to CRT device, at his original implant of her ICD, LV lead was unable to be successfully placed secondary to difficult anatomy.  Past medical history includes NICM, HTN, DM.  Risks, benefits, and alternatives to ICD implantation were reviewed with the patient who wished to proceed.   Hospital Course:  The patient was admitted and underwent implantation of LV lead with details as outlined above. She was monitored on telemetry overnight which demonstrated sinus rhythm.  Left chest was without hematoma or ecchymosis. The device was interrogated and found to be functioning normally.  CXR was obtained and demonstrated no pneumothorax status post device implantation. Wound care, arm mobility, and restrictions were reviewed with the patient.  The patient was examined by [facility] and considered stable for discharge to home.    Physical Exam: Vitals:   11/11/15 1440 11/11/15 1939 11/12/15 0032 11/12/15 0603  BP: (!) 140/58 (!) 147/63 136/61 136/61  Pulse: (!) 59 64 91 91  Resp: '16 18 20 20  '$ Temp:  98.1 F (36.7 C) 98.6 F (37 C) 97.1 F (36.2 C) 98.3 F (36.8 C)  TempSrc: Oral Oral Oral Oral  SpO2: 97% 95% 97% 99%  Weight:    170 lb (77.1 kg)  Height:        GEN- The patient is well appearing, alert and oriented x 3 today.   HEENT: normocephalic, atraumatic; sclera clear, conjunctiva pink; hearing intact; oropharynx clear Lungs- Clear to ausculation bilaterally, normal work of breathing.  No wheezes, rales, rhonchi Heart- Regular rate and rhythm, no murmurs, rubs or gallops, PMI not laterally displaced GI- soft, non-tender, non-distended, bowel sounds present Extremities- no clubbing, cyanosis, or edema; DP/PT/radial pulses 2+ bilaterally MS- no significant deformity or atrophy Skin- warm and dry, no rash or lesion, left chest without hematoma/ecchymosis Psych- euthymic mood, full affect Neuro- no gross defecits  Labs:   Lab Results  Component Value Date   WBC 7.6 11/01/2015   HGB 11.3 (L) 11/01/2015   HCT 35.4 11/01/2015   MCV 74.5 (L) 11/01/2015   PLT 221 11/01/2015     Recent Labs Lab 11/11/15 0645  NA 139  K 4.8  CL 108  CO2 25  BUN 26*  CREATININE 1.32*  CALCIUM 9.7  GLUCOSE 145*    Discharge Medications:    Medication List    TAKE these medications   amiodarone 200 MG tablet Commonly known as:  PACERONE Take 100 mg by mouth daily.   amLODipine 5 MG tablet Commonly known as:  NORVASC Take 1 tablet (5 mg total) by mouth daily.   atorvastatin 20 MG tablet  Commonly known as:  LIPITOR Take 20 mg by mouth daily.   carvedilol 12.5 MG tablet Commonly known as:  COREG Take 12.5 mg by mouth 2 (two) times daily with a meal.   doxylamine (Sleep) 25 MG tablet Commonly known as:  UNISOM Take 25 mg by mouth at bedtime.   ELIQUIS 5 MG Tabs tablet Generic drug:  apixaban Take 5 mg by mouth 2 (two) times daily.   ezetimibe 10 MG tablet Commonly known as:  ZETIA Take one-half tablet (5 mg) by mouth daily   metFORMIN 1000 MG tablet Commonly  known as:  GLUCOPHAGE Take 1,000 mg by mouth 2 (two) times daily with a meal.   multivitamin capsule Take 1 capsule by mouth daily as needed (Vitamin supplement).   PROCTOZONE-HC 2.5 % rectal cream Generic drug:  hydrocortisone Place 1 application rectally as needed for hemorrhoids or itching.       Disposition:  Discharge Instructions    Diet - low sodium heart healthy    Complete by:  As directed    Discharge instructions    Complete by:  As directed    Hold metformin today. Resume tomorrow.   Increase activity slowly    Complete by:  As directed      Follow-up Information    [facility] Follow up on 11/21/2015.   Specialty:  Cardiology Why:  9:00AM, wound check Contact information: [address] [place], NP Follow up on 12/22/2015.   Specialty:  Cardiology Why:  at 12:20pm Contact information: [place] [address] [phone]        Bartholome Bill, MD Follow up in 1 week(s).   Specialty:  Family Medicine Why:  for DM follow up   Per pharmacist:  Metformin PTA dose too high in regard to patient's renal fxn. Ideally, should be discontinued but can consider reduction to at least '500mg'$  BID. Please consider discontinuation or dose change.  Contact information: [place] [address] [phone]           Duration of Discharge Encounter: Greater than 30 minutes including physician time.  Jarrett Soho, [facility] 11/12/2015 9:29 AM  I have seen and examined this patient with Vin Bhagat.  Agree with above, note added to reflect my findings.  On exam, regular rhythm, no murmurs, lungs clear.  Had ICD upgrade to CRT-D.  CXR and interrogation without issues.  Plan for discharge today with follow up in device clinic in 10 days. ACE/ARB held at discharge due to chronic renal disease.  Can  be started back by primary cardiologist.  Will M. Camnitz MD 11/12/2015 9:30 AM

## 2015-11-11 NOTE — Interval H&P Note (Signed)
History and Physical Interval Note:  11/11/2015 7:31 AM  Janet Owen  has presented today for surgery, with the diagnosis of hf  The various methods of treatment have been discussed with the patient and family. After consideration of risks, benefits and other options for treatment, the patient has consented to  Procedure(s): BiVi Upgrade (N/A) as a surgical intervention .  The patient's history has been reviewed, patient examined, no change in status, stable for surgery.  I have reviewed the patient's chart and labs.  Questions were answered to the patient's satisfaction.     [name]

## 2015-11-11 NOTE — H&P (View-Only) (Signed)
HPI Janet Owen is referred today by Dr. Curt Bears and Allred to consider upgrade of her DDD ICD. The patient has a longstanding DCM, chronic systolic heart failure, and LBBB and has been on guideline directed medical therapy. She underwent attempted BiV ICD implant but her LV lead could not be placed due to tortuousity. She has seen Dr. Cyndia Bent regarding an epicardial lead. She is here to discuss an initial attempt by me.   No Known Allergies   Current Outpatient Prescriptions  Medication Sig Dispense Refill  . amiodarone (PACERONE) 200 MG tablet Take 100 mg by mouth daily.   0  . amLODipine (NORVASC) 5 MG tablet Take 1 tablet (5 mg total) by mouth daily. 30 tablet 6  . apixaban (ELIQUIS) 5 MG TABS tablet Take 5 mg by mouth 2 (two) times daily.    Marland Kitchen atorvastatin (LIPITOR) 20 MG tablet Take 20 mg by mouth daily.    . carvedilol (COREG) 12.5 MG tablet Take 12.5 mg by mouth 2 (two) times daily with a meal.    . doxylamine, Sleep, (UNISOM) 25 MG tablet Take 25 mg by mouth at bedtime.     Marland Kitchen ezetimibe (ZETIA) 10 MG tablet Take one-half tablet (5 mg) by mouth daily    . furosemide (LASIX) 20 MG tablet Take 20 mg by mouth daily as needed (swelling).     . metFORMIN (GLUCOPHAGE) 1000 MG tablet Take 1,000 mg by mouth 2 (two) times daily with a meal.    . Multiple Vitamin (MULTIVITAMIN) capsule Take 1 capsule by mouth daily as needed (Vitamin supplement).     Marland Kitchen PROCTOZONE-HC 2.5 % rectal cream Place 1 application rectally as directed. As needed  2  . spironolactone (ALDACTONE) 25 MG tablet Take 12.5 mg by mouth every morning.     No current facility-administered medications for this visit.      Past Medical History:  Diagnosis Date  . Atrial fibrillation ([place])   . DM (diabetes mellitus) ([place])   . Heart failure ([place])   . Hypertension     ROS:   All systems reviewed and negative except as noted in the HPI.   Past Surgical History:  Procedure Laterality Date  . APPENDECTOMY      . BACK SURGERY    . EP IMPLANTABLE DEVICE N/A 09/15/2015   Procedure: BiV ICD Insertion CRT-D;  Surgeon: Will Meredith Leeds, MD;  Location: [facility];  Service: Cardiovascular;  Laterality: N/A;  . PARTIAL HYSTERECTOMY    . REPLACEMENT TOTAL KNEE    . VESICOVAGINAL FISTULA CLOSURE W/ TAH       Family History  Problem Relation Age of Onset  . Asthma Mother   . Pancreatitis Mother   . Alcohol abuse Father      Social History   Social History  . Marital status: Married    Spouse name: N/A  . Number of children: N/A  . Years of education: N/A   Occupational History  . Not on file.   Social History Main Topics  . Smoking status: Never Smoker  . Smokeless tobacco: Never Used  . Alcohol use No  . Drug use: No  . Sexual activity: Not on file   Other Topics Concern  . Not on file   Social History Narrative  . No narrative on file     BP 124/66   Pulse 61   Ht '5\' 7"'$  (1.702 m)   Physical Exam:  Well appearing NAD HEENT: Unremarkable Neck:  No JVD, no thyromegally Lymphatics:  No adenopathy Back:  No CVA tenderness Lungs:  Clear HEART:  Regular rate rhythm, no murmurs, no rubs, no clicks Abd:  soft, positive bowel sounds, no organomegally, no rebound, no guarding Ext:  2 plus pulses, no edema, no cyanosis, no clubbing Skin:  No rashes no nodules Neuro:  CN II through XII intact, motor grossly intact  EKG - nsr with LBBB  Assess/Plan: 1. Chronic systolic heart failure - her symptoms remain class 3. I have offered her an option to undergo another attempt at LV endocardial insertion. She is considering her options and will schedule as she wishes. I spent over 45 minutes with over 50% discussing the procedure with the patient. [name]

## 2015-11-12 ENCOUNTER — Ambulatory Visit (HOSPITAL_COMMUNITY): Payer: Medicare Other

## 2015-11-12 ENCOUNTER — Encounter (HOSPITAL_COMMUNITY): Payer: Self-pay | Admitting: Cardiology

## 2015-11-12 DIAGNOSIS — E119 Type 2 diabetes mellitus without complications: Secondary | ICD-10-CM | POA: Diagnosis not present

## 2015-11-12 DIAGNOSIS — I480 Paroxysmal atrial fibrillation: Secondary | ICD-10-CM | POA: Diagnosis not present

## 2015-11-12 DIAGNOSIS — I110 Hypertensive heart disease with heart failure: Secondary | ICD-10-CM | POA: Diagnosis not present

## 2015-11-12 DIAGNOSIS — I5022 Chronic systolic (congestive) heart failure: Secondary | ICD-10-CM | POA: Diagnosis not present

## 2015-11-12 DIAGNOSIS — Z4502 Encounter for adjustment and management of automatic implantable cardiac defibrillator: Secondary | ICD-10-CM | POA: Diagnosis not present

## 2015-11-12 DIAGNOSIS — I428 Other cardiomyopathies: Secondary | ICD-10-CM | POA: Diagnosis not present

## 2015-11-12 DIAGNOSIS — Z950 Presence of cardiac pacemaker: Secondary | ICD-10-CM | POA: Diagnosis not present

## 2015-11-12 LAB — GLUCOSE, CAPILLARY
GLUCOSE-CAPILLARY: 121 mg/dL — AB (ref 65–99)
Glucose-Capillary: 128 mg/dL — ABNORMAL HIGH (ref 65–99)

## 2015-11-12 MED ORDER — CARVEDILOL 12.5 MG PO TABS
12.5000 mg | ORAL_TABLET | Freq: Two times a day (BID) | ORAL | Status: DC
  Administered 2015-11-12: 12.5 mg via ORAL
  Filled 2015-11-12: qty 1

## 2015-11-12 MED ORDER — METFORMIN HCL 500 MG PO TABS
1000.0000 mg | ORAL_TABLET | Freq: Two times a day (BID) | ORAL | Status: DC
  Administered 2015-11-12: 1000 mg via ORAL
  Filled 2015-11-12: qty 2

## 2015-11-12 NOTE — Progress Notes (Signed)
D/c instructions discussed with pt and she verbalized understanding. Pt d/c home via [facility] with husband.

## 2015-11-14 MED FILL — Sodium Chloride Irrigation Soln 0.9%: Qty: 500 | Status: AC

## 2015-11-14 MED FILL — Cefazolin Sodium-Dextrose IV Solution 2 GM/100ML-4%: INTRAVENOUS | Qty: 100 | Status: AC

## 2015-11-14 MED FILL — Gentamicin Sulfate Inj 40 MG/ML: INTRAMUSCULAR | Qty: 2 | Status: AC

## 2015-11-14 MED FILL — Heparin Sodium (Porcine) 2 Unit/ML in Sodium Chloride 0.9%: INTRAMUSCULAR | Qty: 500 | Status: AC

## 2015-11-18 DIAGNOSIS — E559 Vitamin D deficiency, unspecified: Secondary | ICD-10-CM | POA: Diagnosis not present

## 2015-11-18 DIAGNOSIS — E784 Other hyperlipidemia: Secondary | ICD-10-CM | POA: Diagnosis not present

## 2015-11-18 DIAGNOSIS — E782 Mixed hyperlipidemia: Secondary | ICD-10-CM | POA: Diagnosis not present

## 2015-11-18 DIAGNOSIS — E119 Type 2 diabetes mellitus without complications: Secondary | ICD-10-CM | POA: Diagnosis not present

## 2015-11-21 ENCOUNTER — Ambulatory Visit (INDEPENDENT_AMBULATORY_CARE_PROVIDER_SITE_OTHER): Payer: Medicare Other | Admitting: *Deleted

## 2015-11-21 ENCOUNTER — Telehealth: Payer: Self-pay | Admitting: Internal Medicine

## 2015-11-21 DIAGNOSIS — Z95810 Presence of automatic (implantable) cardiac defibrillator: Secondary | ICD-10-CM

## 2015-11-21 NOTE — Telephone Encounter (Signed)
New message      Pt was seen today.  She was instructed to call and let the device clinic know if her monitor was working.  Pt states it is working

## 2015-11-21 NOTE — Progress Notes (Signed)
Wound CRT-D device check in office. Wound well-healed, incision edges approximated, no redness. Small soft hematoma noted. Pt instructed to call if the hematoma enlarges or becomes hard. Thresholds and sensing consistent with previous device measurements. Lead impedance trends stable over time. No mode switch episodes recorded. No ventricular arrhythmia episodes recorded. Patient bi-ventricularly pacing 99% of the time. Device programmed with appropriate safety margins. Heart failure diagnostics reviewed and trends are stable for patient. No changes made this session. Estimated longevity 5.7 years. Pt stated she and her husband would be leaving for [place] Nov. 1 through March 2018, she would make an apt with Dr. Lovena Le when she got back from [place]. Instructed pt to take her Merlin monitor with her. Pt educated about wound care, arm mobility and lifting instructions and shock plan.

## 2015-11-21 NOTE — Telephone Encounter (Signed)
Called pt back and let her know that her monitor had updated.

## 2015-11-22 DIAGNOSIS — E559 Vitamin D deficiency, unspecified: Secondary | ICD-10-CM | POA: Diagnosis not present

## 2015-11-22 DIAGNOSIS — I10 Essential (primary) hypertension: Secondary | ICD-10-CM | POA: Diagnosis not present

## 2015-11-22 DIAGNOSIS — E119 Type 2 diabetes mellitus without complications: Secondary | ICD-10-CM | POA: Diagnosis not present

## 2015-11-22 DIAGNOSIS — N183 Chronic kidney disease, stage 3 (moderate): Secondary | ICD-10-CM | POA: Diagnosis not present

## 2015-11-22 DIAGNOSIS — E782 Mixed hyperlipidemia: Secondary | ICD-10-CM | POA: Diagnosis not present

## 2015-11-22 DIAGNOSIS — E784 Other hyperlipidemia: Secondary | ICD-10-CM | POA: Diagnosis not present

## 2015-12-19 ENCOUNTER — Encounter: Payer: Medicare Other | Admitting: Cardiology

## 2015-12-22 ENCOUNTER — Encounter: Payer: Medicare Other | Admitting: Nurse Practitioner

## 2016-02-15 ENCOUNTER — Encounter: Payer: Medicare Other | Admitting: Internal Medicine

## 2016-03-26 ENCOUNTER — Ambulatory Visit (INDEPENDENT_AMBULATORY_CARE_PROVIDER_SITE_OTHER): Payer: Medicare Other | Admitting: *Deleted

## 2016-03-26 DIAGNOSIS — I428 Other cardiomyopathies: Secondary | ICD-10-CM

## 2016-03-27 ENCOUNTER — Encounter: Payer: Self-pay | Admitting: Cardiology

## 2016-03-27 NOTE — Progress Notes (Signed)
Remote ICD transmission.   

## 2016-03-29 ENCOUNTER — Other Ambulatory Visit: Payer: Self-pay

## 2016-03-29 ENCOUNTER — Telehealth: Payer: Self-pay

## 2016-03-29 LAB — CUP PACEART REMOTE DEVICE CHECK
Battery Remaining Percentage: 89 %
Brady Statistic RA Percent Paced: 15 %
HIGH POWER IMPEDANCE MEASURED VALUE: 61 Ohm
Implantable Lead Implant Date: 20170803
Implantable Lead Implant Date: 20170803
Implantable Lead Location: 753858
Lead Channel Impedance Value: 430 Ohm
Lead Channel Impedance Value: 430 Ohm
Lead Channel Sensing Intrinsic Amplitude: 11.7 mV
Lead Channel Setting Pacing Amplitude: 2 V
Lead Channel Setting Pacing Pulse Width: 0.5 ms
MDC IDC LEAD IMPLANT DT: 20170803
MDC IDC LEAD LOCATION: 753859
MDC IDC LEAD LOCATION: 753860
MDC IDC MSMT BATTERY REMAINING LONGEVITY: 74 mo
MDC IDC MSMT LEADCHNL LV IMPEDANCE VALUE: 380 Ohm
MDC IDC MSMT LEADCHNL LV PACING THRESHOLD AMPLITUDE: 0.5 V
MDC IDC MSMT LEADCHNL LV PACING THRESHOLD PULSEWIDTH: 0.5 ms
MDC IDC PG IMPLANT DT: 20170803
MDC IDC PG SERIAL: 7348146
MDC IDC SESS DTM: 20180215113453
MDC IDC SET LEADCHNL RA PACING AMPLITUDE: 2.25 V
MDC IDC SET LEADCHNL RV PACING AMPLITUDE: 2.5 V
MDC IDC SET LEADCHNL RV PACING PULSEWIDTH: 0.5 ms
MDC IDC SET LEADCHNL RV SENSING SENSITIVITY: 0.5 mV

## 2016-03-29 MED ORDER — AMLODIPINE BESYLATE 5 MG PO TABS: 5.0000 mg | ORAL_TABLET | Freq: Every day | ORAL | 6 refills | Status: DC

## 2016-03-29 NOTE — Telephone Encounter (Signed)
error 

## 2016-04-12 ENCOUNTER — Encounter: Payer: Self-pay | Admitting: *Deleted

## 2016-04-12 ENCOUNTER — Telehealth: Payer: Self-pay

## 2016-04-12 NOTE — Telephone Encounter (Signed)
-----   Message from Iven Finn, LPN sent at 03/18/5595  1:35 PM EST ----- That should be fine.   Thanks, Renae   ----- Message ----- From: Jimmey Ralph, RN Sent: 04/05/2016  10:38 AM To: Iven Finn, LPN, Evans Lance, MD  Is it okay if I schedule her with Dr. Lovena Le for follow up?

## 2016-04-12 NOTE — Telephone Encounter (Addendum)
error    This encounter was created in error - please disregard.

## 2016-04-12 NOTE — Telephone Encounter (Signed)
Will Meredith Leeds, MD  Jimmey Ralph, RN Cc: Stanton Kidney, RN        Not a problem. Should probably be seen due to flutter.   Previous Messages    ----- Message -----  From: Jimmey Ralph, RN  Sent: 03/29/2016  4:29 PM  To: Will Meredith Leeds, MD, Stanton Kidney, RN   I spoke with this patient who is requesting to be followed by Dr. Lovena Le. She stated that he put her device in and she wanted to continue following with him.

## 2016-04-12 NOTE — Telephone Encounter (Signed)
This encounter was created in error - please disregard.

## 2016-04-13 ENCOUNTER — Other Ambulatory Visit: Payer: Self-pay | Admitting: Internal Medicine

## 2016-05-29 ENCOUNTER — Encounter: Payer: Self-pay | Admitting: Internal Medicine

## 2016-05-29 ENCOUNTER — Ambulatory Visit (INDEPENDENT_AMBULATORY_CARE_PROVIDER_SITE_OTHER): Payer: Medicare Other | Admitting: Internal Medicine

## 2016-05-29 DIAGNOSIS — I5022 Chronic systolic (congestive) heart failure: Secondary | ICD-10-CM | POA: Diagnosis not present

## 2016-05-29 LAB — CUP PACEART INCLINIC DEVICE CHECK
Implantable Lead Location: 753859
Implantable Lead Location: 753860
Implantable Lead Model: 4296
Lead Channel Setting Pacing Amplitude: 2 V
Lead Channel Setting Pacing Amplitude: 2.5 V
Lead Channel Setting Pacing Pulse Width: 0.5 ms
Lead Channel Setting Pacing Pulse Width: 0.5 ms
MDC IDC LEAD IMPLANT DT: 20170803
MDC IDC LEAD IMPLANT DT: 20170803
MDC IDC LEAD IMPLANT DT: 20170803
MDC IDC LEAD LOCATION: 753858
MDC IDC PG IMPLANT DT: 20170803
MDC IDC PG SERIAL: 7348146
MDC IDC SESS DTM: 20180417123737
MDC IDC SET LEADCHNL RA PACING AMPLITUDE: 2.25 V
MDC IDC SET LEADCHNL RV SENSING SENSITIVITY: 0.5 mV

## 2016-05-29 MED ORDER — FUROSEMIDE 40 MG PO TABS: 40.0000 mg | ORAL_TABLET | Freq: Every day | ORAL | 3 refills | Status: DC

## 2016-05-29 MED ORDER — FUROSEMIDE 40 MG PO TABS
40.0000 mg | ORAL_TABLET | Freq: Every day | ORAL | 3 refills | Status: DC
Start: 2016-05-29 — End: 2016-05-29

## 2016-05-29 NOTE — Progress Notes (Signed)
HPI Janet Owen returns today for followup. She is a pleasant 83 yo woman with an ICM, s/p Biv ICD implant who went on to develop atrial flutter with a controlled vR. She has been stable for the past year but noted that she is sob with exertion and that her energy level is not as good as she would like. She has had some swelling in her feet and legs and has dyspnea with exertion. No ICD shock and no syncope.  No Known Allergies   Current Outpatient Prescriptions  Medication Sig Dispense Refill  . amiodarone (PACERONE) 200 MG tablet Take 100 mg by mouth daily.   0  . apixaban (ELIQUIS) 5 MG TABS tablet Take 5 mg by mouth 2 (two) times daily.    Marland Kitchen atorvastatin (LIPITOR) 20 MG tablet Take 20 mg by mouth daily.    . carvedilol (COREG) 12.5 MG tablet Take 12.5 mg by mouth 2 (two) times daily with a meal.    . doxylamine, Sleep, (UNISOM) 25 MG tablet Take 25 mg by mouth at bedtime.     Marland Kitchen ezetimibe (ZETIA) 10 MG tablet Take one-half tablet (5 mg) by mouth daily    . metFORMIN (GLUCOPHAGE) 500 MG tablet Take 1 tablet by mouth 2 (two) times daily with a meal.  2  . Multiple Vitamin (MULTIVITAMIN) capsule Take 1 capsule by mouth daily as needed (Vitamin supplement).     Marland Kitchen PROCTOZONE-HC 2.5 % rectal cream Place 1 application rectally as needed for hemorrhoids or itching.   2  . furosemide (LASIX) 40 MG tablet Take 1 tablet (40 mg total) by mouth daily. 90 tablet 3   No current facility-administered medications for this visit.      Past Medical History:  Diagnosis Date  . Atrial fibrillation ([place])   . DM (diabetes mellitus) ([place])   . Heart failure ([place])   . Hypertension     ROS:   All systems reviewed and negative except as noted in the HPI.   Past Surgical History:  Procedure Laterality Date  . APPENDECTOMY    . BACK SURGERY    . EP IMPLANTABLE DEVICE N/A 11/11/2015   Procedure: BiVi Upgrade;  Surgeon: Evans Lance, MD;  Location: [facility];  Service:  Cardiovascular;  Laterality: N/A;  . EP IMPLANTABLE DEVICE N/A 09/15/2015   Procedure: BiV ICD Insertion CRT-D;  Surgeon: Will Meredith Leeds, MD;  Location: [place] [facility];  Service: Cardiovascular;  Laterality: N/A;  . PARTIAL HYSTERECTOMY    . REPLACEMENT TOTAL KNEE    . VESICOVAGINAL FISTULA CLOSURE W/ TAH       Family History  Problem Relation Age of Onset  . Asthma Mother   . Pancreatitis Mother   . Alcohol abuse Father      Social History   Social History  . Marital status: Married    Spouse name: N/A  . Number of children: N/A  . Years of education: N/A   Occupational History  . Retired    Social History Main Topics  . Smoking status: Never Smoker  . Smokeless tobacco: Never Used  . Alcohol use No  . Drug use: No  . Sexual activity: No   Other Topics Concern  . Not on file   Social History Narrative  . No narrative on file     BP 124/80   Pulse 75   Ht '5\' 7"'$  (1.702 m)   Wt 185 lb (83.9 kg)   SpO2  92%   BMI 28.98 kg/m   Physical Exam:  Well appearing 83 yo woman, NAD HEENT: Unremarkable Neck:  6 cm JVD, no thyromegally Lymphatics:  No adenopathy Back:  No CVA tenderness Lungs:  Clear with no wheezes HEART:  Regular rate rhythm, no murmurs, no rubs, no clicks Abd:  soft, positive bowel sounds, no organomegally, no rebound, no guarding Ext:  2 plus pulses, 1+ edema, no cyanosis, no clubbing Skin:  No rashes no nodules Neuro:  CN II through XII intact, motor grossly intact  EKG - atrial flutter with a CVR  DEVICE  Normal device function.  See PaceArt for details.   Assess/Plan: 1. Atrial flutter - she has been on anti-coagulation and has been in flutter for 6 months. Her rates are controlled. We tried to pace terminate her flutter today but were unsuccessful. ECG looks like typical flutter. I have offered her a catheter ablation but she would like to reflect on this for now. 2. Chronic systolic heart failure - she has class 3 symptoms. I  will add a diuretic and ask her to stop her amlodipine. She has some edema. She will reduce her salt intake. I think she will feel better in NSR. 3. BiV ICD - her [employer]. Jude Biv ICD is working normally with a good LV threshold.  4. coags - she will continue her anti-coagulation for now but might be able to come off of the if we can keep her in NSR.  Janet Owen.D.

## 2016-05-29 NOTE — Patient Instructions (Signed)
Your physician has recommended you make the following change in your medication:  1.) stop amlodipine 2.) start lasix (furosemide) 40 mg once daily  Your physician recommends that you return for lab work in: 2 weeks ([facility])  Your physician recommends that you schedule a follow-up appointment in: 2 weeks with Dr. Lovena Le to discuss atrial flutter ablation.   Remote monitoring is used to monitor your Pacemaker of ICD from home. This monitoring reduces the number of office visits required to check your device to one time per year. It allows [place] to keep an eye on the functioning of your device to ensure it is working properly. You are scheduled for a device check from home on 08/28/16. You may send your transmission at any time that day. If you have a wireless device, the transmission will be sent automatically. After your physician reviews your transmission, you will receive a postcard with your next transmission date.  Your physician wants you to follow-up in: 12 months with Dr. Lovena Le.  You will receive a reminder letter in the mail two months in advance. If you don't receive a letter, please call our office to schedule the follow-up appointment.

## 2016-06-12 ENCOUNTER — Other Ambulatory Visit: Payer: Medicare Other | Admitting: *Deleted

## 2016-06-12 DIAGNOSIS — I5022 Chronic systolic (congestive) heart failure: Secondary | ICD-10-CM

## 2016-06-12 LAB — BASIC METABOLIC PANEL
BUN/Creatinine Ratio: 18 (ref 12–28)
BUN: 27 mg/dL (ref 8–27)
CALCIUM: 10.2 mg/dL (ref 8.7–10.3)
CHLORIDE: 92 mmol/L — AB (ref 96–106)
CO2: 24 mmol/L (ref 18–29)
Creatinine, Ser: 1.54 mg/dL — ABNORMAL HIGH (ref 0.57–1.00)
GFR calc non Af Amer: 32 mL/min/{1.73_m2} — ABNORMAL LOW (ref >59)
GFR, EST AFRICAN AMERICAN: 36 mL/min/{1.73_m2} — AB (ref >59)
Glucose: 348 mg/dL — ABNORMAL HIGH (ref 65–99)
Potassium: 4.8 mmol/L (ref 3.5–5.2)
Sodium: 136 mmol/L (ref 134–144)

## 2016-06-18 ENCOUNTER — Encounter: Payer: Self-pay | Admitting: Internal Medicine

## 2016-06-25 ENCOUNTER — Ambulatory Visit (INDEPENDENT_AMBULATORY_CARE_PROVIDER_SITE_OTHER): Payer: Medicare Other | Admitting: *Deleted

## 2016-06-25 DIAGNOSIS — I428 Other cardiomyopathies: Secondary | ICD-10-CM | POA: Diagnosis not present

## 2016-06-25 NOTE — Progress Notes (Signed)
Remote ICD transmission.   

## 2016-06-26 ENCOUNTER — Encounter: Payer: Self-pay | Admitting: Cardiology

## 2016-06-26 LAB — CUP PACEART REMOTE DEVICE CHECK
Battery Voltage: 3.02 V
Brady Statistic AP VS Percent: 0 %
Brady Statistic AS VP Percent: 97 %
Brady Statistic AS VS Percent: 2.4 %
HighPow Impedance: 68 Ohm
HighPow Impedance: 68 Ohm
Implantable Lead Implant Date: 20170803
Implantable Lead Implant Date: 20170803
Implantable Lead Implant Date: 20170803
Implantable Lead Location: 753859
Implantable Lead Location: 753860
Lead Channel Impedance Value: 460 Ohm
Lead Channel Pacing Threshold Amplitude: 1.25 V
Lead Channel Pacing Threshold Pulse Width: 0.5 ms
Lead Channel Sensing Intrinsic Amplitude: 1.5 mV
Lead Channel Setting Pacing Amplitude: 2 V
Lead Channel Setting Pacing Amplitude: 2.25 V
Lead Channel Setting Pacing Amplitude: 2.5 V
Lead Channel Setting Pacing Pulse Width: 0.5 ms
Lead Channel Setting Pacing Pulse Width: 0.5 ms
MDC IDC LEAD LOCATION: 753858
MDC IDC MSMT BATTERY REMAINING LONGEVITY: 74 mo
MDC IDC MSMT BATTERY REMAINING PERCENTAGE: 86 %
MDC IDC MSMT LEADCHNL LV IMPEDANCE VALUE: 390 Ohm
MDC IDC MSMT LEADCHNL LV PACING THRESHOLD AMPLITUDE: 0.625 V
MDC IDC MSMT LEADCHNL LV PACING THRESHOLD PULSEWIDTH: 0.5 ms
MDC IDC MSMT LEADCHNL RA PACING THRESHOLD PULSEWIDTH: 0.5 ms
MDC IDC MSMT LEADCHNL RV IMPEDANCE VALUE: 440 Ohm
MDC IDC MSMT LEADCHNL RV PACING THRESHOLD AMPLITUDE: 0.5 V
MDC IDC MSMT LEADCHNL RV SENSING INTR AMPL: 11.7 mV
MDC IDC PG IMPLANT DT: 20170803
MDC IDC SESS DTM: 20180514040941
MDC IDC SET LEADCHNL RV SENSING SENSITIVITY: 0.5 mV
MDC IDC STAT BRADY AP VP PERCENT: 0 %
MDC IDC STAT BRADY RA PERCENT PACED: 1 %
Pulse Gen Serial Number: 7348146

## 2016-07-04 ENCOUNTER — Ambulatory Visit (INDEPENDENT_AMBULATORY_CARE_PROVIDER_SITE_OTHER): Payer: Medicare Other | Admitting: Internal Medicine

## 2016-07-04 ENCOUNTER — Encounter: Payer: Self-pay | Admitting: Internal Medicine

## 2016-07-04 VITALS — BP 114/66 | HR 70 | Ht 67.0 in | Wt 183.0 lb

## 2016-07-04 DIAGNOSIS — I4892 Unspecified atrial flutter: Secondary | ICD-10-CM

## 2016-07-04 DIAGNOSIS — Z95810 Presence of automatic (implantable) cardiac defibrillator: Secondary | ICD-10-CM | POA: Diagnosis not present

## 2016-07-04 DIAGNOSIS — I5022 Chronic systolic (congestive) heart failure: Secondary | ICD-10-CM | POA: Diagnosis not present

## 2016-07-04 DIAGNOSIS — Z01812 Encounter for preprocedural laboratory examination: Secondary | ICD-10-CM

## 2016-07-04 NOTE — Patient Instructions (Addendum)
Medication Instructions:  Your physician recommends that you continue on your current medications as directed. Please refer to the Current Medication list given to you today.   Labwork: BM[facility] / [place] office on 07/23/16   Testing/Procedures:  Your physician has recommended that you have an ablation. Catheter ablation is a medical procedure used to treat some cardiac arrhythmias (irregular heartbeats). During catheter ablation, a long, thin, flexible tube is put into a blood vessel in your groin (upper thigh), or neck. This tube is called an ablation catheter. It is then guided to your heart through the blood vessel. Radio frequency waves destroy small areas of heart tissue where abnormal heartbeats may cause an arrhythmia to start. Please see the instruction sheet given to you today.     Follow-Up: Your physician recommends that you schedule a follow-up appointment in: 4-6 weeks with Dr. Lovena Le    Any Other Special Instructions Will Be Listed Below (If Applicable).  Please report to the [employer] [facility] on  07/30/16  at  9:30 AM  Nothing to eat or drink after midnight the night prior to the procedure  Do not take any medication the morning of the procedure  Plan a 1 night stay OR someone to drive you home after the procedure    If you need a refill on your cardiac medications before your next appointment, please call your pharmacy.

## 2016-07-06 NOTE — Progress Notes (Signed)
HPI [name] returns today for followup. She is a pleasant 83 yo woman with an ICM, s/p Biv ICD implant who went on to develop atrial flutter with a controlled vR. She was anti-coagulated and returns today to discuss EP study and catheter ablation. She has class 2 dyspnea. No syncope or ICD shock.   No Known Allergies   Current Outpatient Prescriptions  Medication Sig Dispense Refill  . amiodarone (PACERONE) 200 MG tablet Take 100 mg by mouth daily.   0  . apixaban (ELIQUIS) 5 MG TABS tablet Take 5 mg by mouth 2 (two) times daily.    Marland Kitchen atorvastatin (LIPITOR) 20 MG tablet Take 20 mg by mouth daily.    . carvedilol (COREG) 12.5 MG tablet Take 12.5 mg by mouth 2 (two) times daily with a meal.    . doxylamine, Sleep, (UNISOM) 25 MG tablet Take 25 mg by mouth at bedtime.     Marland Kitchen ezetimibe (ZETIA) 10 MG tablet Take one-half tablet (5 mg) by mouth daily    . furosemide (LASIX) 40 MG tablet Take 1 tablet (40 mg total) by mouth daily. 90 tablet 3  . metFORMIN (GLUCOPHAGE) 500 MG tablet Take 1 tablet by mouth 2 (two) times daily with a meal.  2  . Multiple Vitamin (MULTIVITAMIN) capsule Take 1 capsule by mouth daily as needed (Vitamin supplement).     Marland Kitchen PROCTOZONE-HC 2.5 % rectal cream Place 1 application rectally as needed for hemorrhoids or itching.   2   No current facility-administered medications for this visit.      Past Medical History:  Diagnosis Date  . Atrial fibrillation ([place])   . DM (diabetes mellitus) ([place])   . Heart failure ([place])   . Hypertension     ROS:   All systems reviewed and negative except as noted in the HPI.   Past Surgical History:  Procedure Laterality Date  . APPENDECTOMY    . BACK SURGERY    . EP IMPLANTABLE DEVICE N/A 11/11/2015   Procedure: BiVi Upgrade;  Surgeon: Evans Lance, MD;  Location: [facility];  Service: Cardiovascular;  Laterality: N/A;  . EP IMPLANTABLE DEVICE N/A 09/15/2015   Procedure: BiV ICD Insertion CRT-D;  Surgeon:  Will Meredith Leeds, MD;  Location: [facility];  Service: Cardiovascular;  Laterality: N/A;  . PARTIAL HYSTERECTOMY    . REPLACEMENT TOTAL KNEE    . VESICOVAGINAL FISTULA CLOSURE W/ TAH       Family History  Problem Relation Age of Onset  . Asthma Mother   . Pancreatitis Mother   . Alcohol abuse Father      Social History   Social History  . Marital status: Married    Spouse name: N/A  . Number of children: N/A  . Years of education: N/A   Occupational History  . Retired    Social History Main Topics  . Smoking status: Never Smoker  . Smokeless tobacco: Never Used  . Alcohol use No  . Drug use: No  . Sexual activity: No   Other Topics Concern  . Not on file   Social History Narrative  . No narrative on file     BP 114/66   Pulse 70   Ht 5\' 7"  (1.702 m)   Wt 183 lb (83 kg)   SpO2 97%   BMI 28.66 kg/m   Physical Exam:  Well appearing 83 yo woman, NAD HEENT: Unremarkable Neck:  6 cm JVD, no thyromegally  Lymphatics:  No adenopathy Back:  No CVA tenderness Lungs:  Clear with no wheezes HEART:  Regular rate rhythm, no murmurs, no rubs, no clicks Abd:  soft, positive bowel sounds, no organomegally, no rebound, no guarding Ext:  2 plus pulses, 1+ edema, no cyanosis, no clubbing Skin:  No rashes no nodules Neuro:  CN II through XII intact, motor grossly intact  EKG - atrial flutter with a CVR  DEVICE  Normal device function.  See PaceArt for details.   Assess/Plan: 1. Atrial flutter - she has been on anti-coagulation and has been in flutter for 6 months. Her rates are controlled. I have offered her a catheter ablation and she will call us if she whishes to proceed. 2. Chronic systolic heart failure - she has class 2B symptoms. She will continue her diuretic. She has some edema but this has gotten better with the lasix. She will reduce her salt intake. I think she will feel better in NSR. 3. BiV ICD - her St. Jude Biv ICD is working normally with a  good LV threshold.  4. coags - she will continue her anti-coagulation for now but might be able to come off of the if we can keep her in NSR.  [name]

## 2016-07-10 DIAGNOSIS — E119 Type 2 diabetes mellitus without complications: Secondary | ICD-10-CM | POA: Diagnosis not present

## 2016-07-10 DIAGNOSIS — H43393 Other vitreous opacities, bilateral: Secondary | ICD-10-CM | POA: Diagnosis not present

## 2016-07-10 DIAGNOSIS — Z961 Presence of intraocular lens: Secondary | ICD-10-CM | POA: Diagnosis not present

## 2016-07-23 ENCOUNTER — Other Ambulatory Visit: Payer: Medicare Other | Admitting: *Deleted

## 2016-07-23 DIAGNOSIS — Z01812 Encounter for preprocedural laboratory examination: Secondary | ICD-10-CM | POA: Diagnosis not present

## 2016-07-23 DIAGNOSIS — I4892 Unspecified atrial flutter: Secondary | ICD-10-CM

## 2016-07-23 LAB — BASIC METABOLIC PANEL
BUN/Creatinine Ratio: 14 (ref 12–28)
BUN: 23 mg/dL (ref 8–27)
CALCIUM: 10.1 mg/dL (ref 8.7–10.3)
CHLORIDE: 95 mmol/L — AB (ref 96–106)
CO2: 25 mmol/L (ref 20–29)
Creatinine, Ser: 1.62 mg/dL — ABNORMAL HIGH (ref 0.57–1.00)
GFR calc Af Amer: 34 mL/min/{1.73_m2} — ABNORMAL LOW (ref >59)
GFR, EST NON AFRICAN AMERICAN: 30 mL/min/{1.73_m2} — AB (ref >59)
GLUCOSE: 402 mg/dL — AB (ref 65–99)
POTASSIUM: 5.4 mmol/L — AB (ref 3.5–5.2)
Sodium: 135 mmol/L (ref 134–144)

## 2016-07-23 LAB — CBC WITH DIFFERENTIAL/PLATELET
BASOS ABS: 0 10*3/uL (ref 0.0–0.2)
Basos: 1 %
EOS (ABSOLUTE): 0.2 10*3/uL (ref 0.0–0.4)
Eos: 5 %
Hematocrit: 38.5 % (ref 34.0–46.6)
Hemoglobin: 11.7 g/dL (ref 11.1–15.9)
IMMATURE GRANS (ABS): 0 10*3/uL (ref 0.0–0.1)
IMMATURE GRANULOCYTES: 0 %
LYMPHS: 22 %
Lymphocytes Absolute: 1.1 10*3/uL (ref 0.7–3.1)
MCH: 21.2 pg — ABNORMAL LOW (ref 26.6–33.0)
MCHC: 30.4 g/dL — ABNORMAL LOW (ref 31.5–35.7)
MCV: 70 fL — ABNORMAL LOW (ref 79–97)
Monocytes Absolute: 0.5 10*3/uL (ref 0.1–0.9)
Monocytes: 9 %
NEUTROS PCT: 63 %
Neutrophils Absolute: 3.2 10*3/uL (ref 1.4–7.0)
PLATELETS: 169 10*3/uL (ref 150–379)
RBC: 5.52 x10E6/uL — AB (ref 3.77–5.28)
RDW: 17.7 % — AB (ref 12.3–15.4)
WBC: 5.1 10*3/uL (ref 3.4–10.8)

## 2016-07-30 ENCOUNTER — Ambulatory Visit (HOSPITAL_COMMUNITY)
Admission: RE | Admit: 2016-07-30 | Discharge: 2016-07-30 | Disposition: A | Discharge status: Home / Self Care | Payer: Medicare Other | Source: Ambulatory Visit | Attending: Internal Medicine | Admitting: Internal Medicine

## 2016-07-30 ENCOUNTER — Encounter (HOSPITAL_COMMUNITY): Payer: Self-pay | Admitting: Internal Medicine

## 2016-07-30 ENCOUNTER — Ambulatory Visit (HOSPITAL_COMMUNITY)
Admission: RE | Disposition: A | Discharge status: Home / Self Care | Payer: Self-pay | Source: Ambulatory Visit | Attending: Internal Medicine

## 2016-07-30 DIAGNOSIS — Z811 Family history of alcohol abuse and dependence: Secondary | ICD-10-CM | POA: Insufficient documentation

## 2016-07-30 DIAGNOSIS — I110 Hypertensive heart disease with heart failure: Secondary | ICD-10-CM | POA: Diagnosis not present

## 2016-07-30 DIAGNOSIS — E119 Type 2 diabetes mellitus without complications: Secondary | ICD-10-CM | POA: Diagnosis not present

## 2016-07-30 DIAGNOSIS — I4892 Unspecified atrial flutter: Secondary | ICD-10-CM | POA: Diagnosis present

## 2016-07-30 DIAGNOSIS — Z79899 Other long term (current) drug therapy: Secondary | ICD-10-CM | POA: Diagnosis not present

## 2016-07-30 DIAGNOSIS — I5022 Chronic systolic (congestive) heart failure: Secondary | ICD-10-CM | POA: Diagnosis not present

## 2016-07-30 DIAGNOSIS — Z95810 Presence of automatic (implantable) cardiac defibrillator: Secondary | ICD-10-CM | POA: Insufficient documentation

## 2016-07-30 DIAGNOSIS — Z7984 Long term (current) use of oral hypoglycemic drugs: Secondary | ICD-10-CM | POA: Insufficient documentation

## 2016-07-30 DIAGNOSIS — I483 Typical atrial flutter: Secondary | ICD-10-CM | POA: Diagnosis not present

## 2016-07-30 DIAGNOSIS — Z825 Family history of asthma and other chronic lower respiratory diseases: Secondary | ICD-10-CM | POA: Diagnosis not present

## 2016-07-30 DIAGNOSIS — Z7901 Long term (current) use of anticoagulants: Secondary | ICD-10-CM | POA: Diagnosis not present

## 2016-07-30 DIAGNOSIS — I4891 Unspecified atrial fibrillation: Secondary | ICD-10-CM | POA: Insufficient documentation

## 2016-07-30 HISTORY — PX: A-FLUTTER ABLATION: EP1230

## 2016-07-30 LAB — GLUCOSE, CAPILLARY
GLUCOSE-CAPILLARY: 200 mg/dL — AB (ref 65–99)
Glucose-Capillary: 260 mg/dL — ABNORMAL HIGH (ref 65–99)

## 2016-07-30 SURGERY — A-FLUTTER ABLATION

## 2016-07-30 MED ORDER — FENTANYL CITRATE (PF) 100 MCG/2ML IJ SOLN
INTRAMUSCULAR | Status: AC
  Filled 2016-07-30: qty 2

## 2016-07-30 MED ORDER — AMIODARONE HCL 100 MG PO TABS: 100.0000 mg | ORAL_TABLET | Freq: Two times a day (BID) | ORAL | Status: DC

## 2016-07-30 MED ORDER — SODIUM CHLORIDE 0.9% FLUSH: 3.0000 mL | INTRAVENOUS | Status: DC | PRN

## 2016-07-30 MED ORDER — HEPARIN (PORCINE) IN NACL 2-0.9 UNIT/ML-% IJ SOLN
INTRAMUSCULAR | Status: AC | PRN
  Administered 2016-07-30: 500 mL

## 2016-07-30 MED ORDER — HEPARIN (PORCINE) IN NACL 2-0.9 UNIT/ML-% IJ SOLN
INTRAMUSCULAR | Status: AC
  Filled 2016-07-30: qty 500

## 2016-07-30 MED ORDER — MULTIVITAMINS PO CAPS: 1.0000 | ORAL_CAPSULE | Freq: Every day | ORAL | Status: DC | PRN

## 2016-07-30 MED ORDER — DOXYLAMINE SUCCINATE (SLEEP) 25 MG PO TABS: 25.0000 mg | ORAL_TABLET | Freq: Every day | ORAL | Status: DC

## 2016-07-30 MED ORDER — FENTANYL CITRATE (PF) 100 MCG/2ML IJ SOLN
INTRAMUSCULAR | Status: DC | PRN
  Administered 2016-07-30: 12.5 ug via INTRAVENOUS
  Administered 2016-07-30: 25 ug via INTRAVENOUS
  Administered 2016-07-30: 12.5 ug via INTRAVENOUS

## 2016-07-30 MED ORDER — HYDRALAZINE HCL 20 MG/ML IJ SOLN
10.0000 mg | Freq: Once | INTRAMUSCULAR | Status: AC
  Administered 2016-07-30: 10 mg via INTRAVENOUS

## 2016-07-30 MED ORDER — ATORVASTATIN CALCIUM 20 MG PO TABS
20.0000 mg | ORAL_TABLET | Freq: Every day | ORAL | Status: DC
  Filled 2016-07-30: qty 1

## 2016-07-30 MED ORDER — MIDAZOLAM HCL 5 MG/5ML IJ SOLN
INTRAMUSCULAR | Status: DC | PRN
  Administered 2016-07-30: 2 mg via INTRAVENOUS
  Administered 2016-07-30 (×3): 1 mg via INTRAVENOUS

## 2016-07-30 MED ORDER — MIDAZOLAM HCL 5 MG/5ML IJ SOLN
INTRAMUSCULAR | Status: AC
  Filled 2016-07-30: qty 5

## 2016-07-30 MED ORDER — BUPIVACAINE HCL (PF) 0.25 % IJ SOLN
INTRAMUSCULAR | Status: AC
  Filled 2016-07-30: qty 30

## 2016-07-30 MED ORDER — SODIUM CHLORIDE 0.9% FLUSH: 3.0000 mL | Freq: Two times a day (BID) | INTRAVENOUS | Status: DC

## 2016-07-30 MED ORDER — CARVEDILOL 12.5 MG PO TABS
12.5000 mg | ORAL_TABLET | Freq: Two times a day (BID) | ORAL | Status: DC
Start: 2016-07-30 — End: 2016-07-31
  Administered 2016-07-30: 12.5 mg via ORAL
  Filled 2016-07-30 (×2): qty 1

## 2016-07-30 MED ORDER — SODIUM CHLORIDE 0.9 % IV SOLN
INTRAVENOUS | Status: DC
  Administered 2016-07-30: 09:00:00 via INTRAVENOUS

## 2016-07-30 MED ORDER — ONDANSETRON HCL 4 MG/2ML IJ SOLN: 4.0000 mg | Freq: Four times a day (QID) | INTRAMUSCULAR | Status: DC | PRN

## 2016-07-30 MED ORDER — ACETAMINOPHEN 325 MG PO TABS: 650.0000 mg | ORAL_TABLET | ORAL | Status: DC | PRN

## 2016-07-30 MED ORDER — METFORMIN HCL 500 MG PO TABS
500.0000 mg | ORAL_TABLET | Freq: Two times a day (BID) | ORAL | Status: DC
  Administered 2016-07-30: 500 mg via ORAL
  Filled 2016-07-30 (×3): qty 1

## 2016-07-30 MED ORDER — BUPIVACAINE HCL (PF) 0.25 % IJ SOLN
INTRAMUSCULAR | Status: DC | PRN
  Administered 2016-07-30: 20 mL

## 2016-07-30 MED ORDER — IBUPROFEN 400 MG PO TABS: 400.0000 mg | ORAL_TABLET | Freq: Four times a day (QID) | ORAL | Status: DC | PRN

## 2016-07-30 MED ORDER — SODIUM CHLORIDE 0.9 % IV SOLN: 250.0000 mL | INTRAVENOUS | Status: DC | PRN

## 2016-07-30 MED ORDER — HYDROCORTISONE 2.5 % RE CREA: 1.0000 "application " | TOPICAL_CREAM | RECTAL | Status: DC | PRN

## 2016-07-30 MED ORDER — HYDRALAZINE HCL 20 MG/ML IJ SOLN
INTRAMUSCULAR | Status: AC
  Filled 2016-07-30: qty 1

## 2016-07-30 MED ORDER — FUROSEMIDE 40 MG PO TABS
40.0000 mg | ORAL_TABLET | Freq: Every day | ORAL | Status: DC
Start: 2016-07-30 — End: 2016-07-31
  Filled 2016-07-30: qty 1

## 2016-07-30 SURGICAL SUPPLY — 11 items
BAG SNAP BAND KOVER 36X36 (MISCELLANEOUS) ×3 IMPLANT
CATH EZ STEER NAV 8MM F-J CUR (ABLATOR) ×3 IMPLANT
CATH JOSEPHSON QUAD-ALLRED 6FR (CATHETERS) ×3 IMPLANT
CATH POLARIS X 2.5/5/2.5 DECAP (CATHETERS) ×3 IMPLANT
PACK EP LATEX FREE (CUSTOM PROCEDURE TRAY) ×2
PACK EP LF (CUSTOM PROCEDURE TRAY) ×1 IMPLANT
PAD DEFIB LIFELINK (PAD) ×3 IMPLANT
PATCH CARTO3 (PAD) ×3 IMPLANT
SHEATH PINNACLE 6F 10CM (SHEATH) ×3 IMPLANT
SHEATH PINNACLE 8F 10CM (SHEATH) ×6 IMPLANT
SHIELD RADPAD SCOOP 12X17 (MISCELLANEOUS) ×3 IMPLANT

## 2016-07-30 NOTE — Progress Notes (Signed)
Called Dr Lovena Le because client wanted to know when to resume eliquis and per Dr Lovena Le resume tomorrow

## 2016-07-30 NOTE — Interval H&P Note (Signed)
History and Physical Interval Note:  07/30/2016 1:18 PM  Janet Owen  has presented today for surgery, with the diagnosis of aflutter  The various methods of treatment have been discussed with the patient and family. After consideration of risks, benefits and other options for treatment, the patient has consented to  Procedure(s): A-Flutter Ablation (N/A) as a surgical intervention .  The patient's history has been reviewed, patient examined, no change in status, stable for surgery.  I have reviewed the patient's chart and labs.  Questions were answered to the patient's satisfaction.     [name]

## 2016-07-30 NOTE — Progress Notes (Signed)
Site area: rt groin 3 fv sheaths Site Prior to Removal:  Level 0 Pressure Applied For: 20 minutes Manual:   yes Patient Status During Pull:  stable Post Pull Site:  Level 0 Post Pull Instructions Given:  yes Post Pull Pulses Present: palpable Dressing Applied:  Gauze and tegaderm Bedrest begins @ 1520 Comments: IV saline locked

## 2016-07-30 NOTE — H&P (View-Only) (Signed)
HPI [name] returns today for followup. She is a pleasant 83 yo woman with an ICM, s/p Biv ICD implant who went on to develop atrial flutter with a controlled vR. She was anti-coagulated and returns today to discuss EP study and catheter ablation. She has class 2 dyspnea. No syncope or ICD shock.   No Known Allergies   Current Outpatient Prescriptions  Medication Sig Dispense Refill  . amiodarone (PACERONE) 200 MG tablet Take 100 mg by mouth daily.   0  . apixaban (ELIQUIS) 5 MG TABS tablet Take 5 mg by mouth 2 (two) times daily.    Marland Kitchen atorvastatin (LIPITOR) 20 MG tablet Take 20 mg by mouth daily.    . carvedilol (COREG) 12.5 MG tablet Take 12.5 mg by mouth 2 (two) times daily with a meal.    . doxylamine, Sleep, (UNISOM) 25 MG tablet Take 25 mg by mouth at bedtime.     Marland Kitchen ezetimibe (ZETIA) 10 MG tablet Take one-half tablet (5 mg) by mouth daily    . furosemide (LASIX) 40 MG tablet Take 1 tablet (40 mg total) by mouth daily. 90 tablet 3  . metFORMIN (GLUCOPHAGE) 500 MG tablet Take 1 tablet by mouth 2 (two) times daily with a meal.  2  . Multiple Vitamin (MULTIVITAMIN) capsule Take 1 capsule by mouth daily as needed (Vitamin supplement).     Marland Kitchen PROCTOZONE-HC 2.5 % rectal cream Place 1 application rectally as needed for hemorrhoids or itching.   2   No current facility-administered medications for this visit.      Past Medical History:  Diagnosis Date  . Atrial fibrillation ([place])   . DM (diabetes mellitus) ([place])   . Heart failure ([employer])   . Hypertension     ROS:   All systems reviewed and negative except as noted in the HPI.   Past Surgical History:  Procedure Laterality Date  . APPENDECTOMY    . BACK SURGERY    . EP IMPLANTABLE DEVICE N/A 11/11/2015   Procedure: BiVi Upgrade;  Surgeon: Evans Lance, MD;  Location: [facility];  Service: Cardiovascular;  Laterality: N/A;  . EP IMPLANTABLE DEVICE N/A 09/15/2015   Procedure: BiV ICD Insertion CRT-D;  Surgeon:  Will Meredith Leeds, MD;  Location: [facility];  Service: Cardiovascular;  Laterality: N/A;  . PARTIAL HYSTERECTOMY    . REPLACEMENT TOTAL KNEE    . VESICOVAGINAL FISTULA CLOSURE W/ TAH       Family History  Problem Relation Age of Onset  . Asthma Mother   . Pancreatitis Mother   . Alcohol abuse Father      Social History   Social History  . Marital status: Married    Spouse name: N/A  . Number of children: N/A  . Years of education: N/A   Occupational History  . Retired    Social History Main Topics  . Smoking status: Never Smoker  . Smokeless tobacco: Never Used  . Alcohol use No  . Drug use: No  . Sexual activity: No   Other Topics Concern  . Not on file   Social History Narrative  . No narrative on file     BP 114/66   Pulse 70   Ht 5\' 7"  (1.702 m)   Wt 183 lb (83 kg)   SpO2 97%   BMI 28.66 kg/m   Physical Exam:  Well appearing 83 yo woman, NAD HEENT: Unremarkable Neck:  6 cm JVD, no thyromegally  Lymphatics:  No adenopathy Back:  No CVA tenderness Lungs:  Clear with no wheezes HEART:  Regular rate rhythm, no murmurs, no rubs, no clicks Abd:  soft, positive bowel sounds, no organomegally, no rebound, no guarding Ext:  2 plus pulses, 1+ edema, no cyanosis, no clubbing Skin:  No rashes no nodules Neuro:  CN II through XII intact, motor grossly intact  EKG - atrial flutter with a CVR  DEVICE  Normal device function.  See PaceArt for details.   Assess/Plan: 1. Atrial flutter - she has been on anti-coagulation and has been in flutter for 6 months. Her rates are controlled. I have offered her a catheter ablation and she will call us if she whishes to proceed. 2. Chronic systolic heart failure - she has class 2B symptoms. She will continue her diuretic. She has some edema but this has gotten better with the lasix. She will reduce her salt intake. I think she will feel better in NSR. 3. BiV ICD - her St. Jude Biv ICD is working normally with a  good LV threshold.  4. coags - she will continue her anti-coagulation for now but might be able to come off of the if we can keep her in NSR.  [name]

## 2016-07-30 NOTE — Discharge Instructions (Signed)
May resume Eliquis tomorrow   No driving for 4 days. No lifting over 5 lbs for 1 week. No sexual activity for 1 week. You may return to work in 1 week. Keep procedure site clean & dry. If you notice increased pain, swelling, bleeding or pus, call/return!  You may shower, but no soaking baths/hot tubs/pools for 1 week.

## 2016-07-30 NOTE — Progress Notes (Signed)
Dr Lovena Le notified just received lasix and client refused to take this late in day; notified of B/P and order noted

## 2016-07-30 NOTE — Progress Notes (Signed)
Per Dr Lovena Le OK to discharge at 2100

## 2016-07-30 NOTE — Progress Notes (Signed)
Paged Dr Lovena Le to clarify dosage of client's amiodorone and no answer; called Hao,PA and advised client to take amiodorone as on discharge instructions and advised client to call Dr Tanna Furry office in morning and clarify and client voiced understanding

## 2016-07-31 ENCOUNTER — Telehealth: Payer: Self-pay | Admitting: Internal Medicine

## 2016-07-31 MED FILL — Bupivacaine HCl Preservative Free (PF) Inj 0.25%: INTRAMUSCULAR | Qty: 30 | Status: AC

## 2016-07-31 MED FILL — Midazolam HCl Inj 5 MG/5ML (Base Equivalent): INTRAMUSCULAR | Qty: 5 | Status: AC

## 2016-07-31 NOTE — Telephone Encounter (Signed)
Received incoming call from pr r/t medication Amiodarone. Pt stated she currently takes Amiodarone 100 mg (1/2 tablet)  once daily. Pt requested instructions on Amiodarone. Pt stated medication instructions were not clear and given to pt's husband prior to discharge at the [facility] d/c instructions show Amiodarone 100 mg twice daily, however, the top of d/c instructions do not list any medication changes. Last ov note from Dr. Lovena Le on 07/04/16 listed pt taking Amiodarone 100 mg once daily.  Informed I will forward to with Dr. Lovena Le to advise. Pt stated she is not pleased with how difficult it is to get thought to speak with someone at our office. I was very apologetic, and informed I will contact pt back as quickly as I can.

## 2016-07-31 NOTE — Telephone Encounter (Signed)
Follow Up:   Pt had an Ablation yesterday,she has a question about her medicine.

## 2016-07-31 NOTE — Telephone Encounter (Signed)
Called, spoke with pt. Informed I spoke with Dr. Lovena Le. Dr. Lovena Le would like pt to take Amiodarone 100 mg twice daily for 2 weeks, then reduce dose to 100 mg daily. Pt verbalized understanding and thanked me for calling.

## 2016-08-06 DIAGNOSIS — E784 Other hyperlipidemia: Secondary | ICD-10-CM | POA: Diagnosis not present

## 2016-08-06 DIAGNOSIS — E119 Type 2 diabetes mellitus without complications: Secondary | ICD-10-CM | POA: Diagnosis not present

## 2016-08-06 DIAGNOSIS — E559 Vitamin D deficiency, unspecified: Secondary | ICD-10-CM | POA: Diagnosis not present

## 2016-08-14 DIAGNOSIS — N183 Chronic kidney disease, stage 3 unspecified: Secondary | ICD-10-CM | POA: Insufficient documentation

## 2016-08-14 DIAGNOSIS — I129 Hypertensive chronic kidney disease with stage 1 through stage 4 chronic kidney disease, or unspecified chronic kidney disease: Secondary | ICD-10-CM | POA: Diagnosis not present

## 2016-08-14 DIAGNOSIS — E782 Mixed hyperlipidemia: Secondary | ICD-10-CM | POA: Diagnosis not present

## 2016-08-14 DIAGNOSIS — E559 Vitamin D deficiency, unspecified: Secondary | ICD-10-CM | POA: Insufficient documentation

## 2016-08-14 DIAGNOSIS — E1122 Type 2 diabetes mellitus with diabetic chronic kidney disease: Secondary | ICD-10-CM | POA: Diagnosis not present

## 2016-08-27 DIAGNOSIS — E119 Type 2 diabetes mellitus without complications: Secondary | ICD-10-CM | POA: Diagnosis not present

## 2016-08-28 ENCOUNTER — Encounter: Payer: Self-pay | Admitting: Internal Medicine

## 2016-08-28 ENCOUNTER — Ambulatory Visit (INDEPENDENT_AMBULATORY_CARE_PROVIDER_SITE_OTHER): Payer: Medicare Other | Admitting: Internal Medicine

## 2016-08-28 VITALS — BP 132/70 | HR 61 | Ht 67.0 in | Wt 187.0 lb

## 2016-08-28 DIAGNOSIS — I4892 Unspecified atrial flutter: Secondary | ICD-10-CM

## 2016-08-28 DIAGNOSIS — Z95810 Presence of automatic (implantable) cardiac defibrillator: Secondary | ICD-10-CM

## 2016-08-28 DIAGNOSIS — I5022 Chronic systolic (congestive) heart failure: Secondary | ICD-10-CM | POA: Diagnosis not present

## 2016-08-28 NOTE — Progress Notes (Signed)
HPI Janet Owen returns today for followup. She is a pleasant 83 yo woman with an ICM, s/p Biv ICD implant who went on to develop atrial flutter with a controlled vR. She was anti-coagulated and underwent catheter ablation of atrial flutter. She feels better but admits to being sedentary. No chest pain. Minimal edema.  Allergies  Allergen Reactions  . Exenatide Nausea Only    Dizziness  . Raloxifene Other (See Comments)    hot flashes     Current Outpatient Prescriptions  Medication Sig Dispense Refill  . amiodarone (PACERONE) 200 MG tablet Take 100 mg by mouth 2 (two) times daily.   0  . apixaban (ELIQUIS) 5 MG TABS tablet Take 5 mg by mouth 2 (two) times daily.    Marland Kitchen atorvastatin (LIPITOR) 20 MG tablet Take 20 mg by mouth daily.    . carvedilol (COREG) 12.5 MG tablet Take 12.5 mg by mouth 2 (two) times daily with a meal.    . doxylamine, Sleep, (UNISOM) 25 MG tablet Take 25 mg by mouth at bedtime.     Marland Kitchen ezetimibe (ZETIA) 10 MG tablet Take one-half tablet (5 mg) by mouth daily    . furosemide (LASIX) 40 MG tablet Take 1 tablet (40 mg total) by mouth daily. 90 tablet 3  . glimepiride (AMARYL) 4 MG tablet Take 1 tablet by mouth daily.  2  . ibuprofen (ADVIL,MOTRIN) 200 MG tablet Take 400 mg by mouth every 6 (six) hours as needed for mild pain or moderate pain.    Marland Kitchen JANUVIA 50 MG tablet Take 1 tablet by mouth daily.  2  . metFORMIN (GLUCOPHAGE) 500 MG tablet Take 1 tablet by mouth 2 (two) times daily with a meal.  2  . Multiple Vitamin (MULTIVITAMIN) capsule Take 1 capsule by mouth daily as needed (Vitamin supplement).     Marland Kitchen PROCTOZONE-HC 2.5 % rectal cream Place 1 application rectally as needed for hemorrhoids (Use as directed).   2   No current facility-administered medications for this visit.      Past Medical History:  Diagnosis Date  . Atrial fibrillation ([place])   . DM (diabetes mellitus) ([place])   . Heart failure ([place])   . Hypertension     ROS:   All systems  reviewed and negative except as noted in the HPI.   Past Surgical History:  Procedure Laterality Date  . A-FLUTTER ABLATION N/A 07/30/2016   Procedure: A-Flutter Ablation;  Surgeon: Evans Lance, MD;  Location: [facility];  Service: Cardiovascular;  Laterality: N/A;  . APPENDECTOMY    . BACK SURGERY    . EP IMPLANTABLE DEVICE N/A 11/11/2015   Procedure: BiVi Upgrade;  Surgeon: Evans Lance, MD;  Location: [facility];  Service: Cardiovascular;  Laterality: N/A;  . EP IMPLANTABLE DEVICE N/A 09/15/2015   Procedure: BiV ICD Insertion CRT-D;  Surgeon: Will Meredith Leeds, MD;  Location: [facility];  Service: Cardiovascular;  Laterality: N/A;  . PARTIAL HYSTERECTOMY    . REPLACEMENT TOTAL KNEE    . VESICOVAGINAL FISTULA CLOSURE W/ TAH       Family History  Problem Relation Age of Onset  . Asthma Mother   . Pancreatitis Mother   . Alcohol abuse Father      Social History   Social History  . Marital status: Married    Spouse name: N/A  . Number of children: N/A  . Years of education: N/A   Occupational History  .  Retired    Social History Main Topics  . Smoking status: Never Smoker  . Smokeless tobacco: Never Used  . Alcohol use No  . Drug use: No  . Sexual activity: No   Other Topics Concern  . Not on file   Social History Narrative  . No narrative on file     BP 132/70   Pulse 61   Ht 5\' 7"  (1.702 m)   Wt 187 lb (84.8 kg)   SpO2 96%   BMI 29.29 kg/m   Physical Exam:  Well appearing 83 yo woman, NAD HEENT: Unremarkable Neck:  6 cm JVD, no thyromegally Lymphatics:  No adenopathy Back:  No CVA tenderness Lungs:  Clear with no wheezes HEART:  Regular rate rhythm, no murmurs, no rubs, no clicks Abd:  soft, positive bowel sounds, no organomegally, no rebound, no guarding Ext:  2 plus pulses, 1+ edema, no cyanosis, no clubbing Skin:  No rashes no nodules Neuro:  CN II through XII intact, motor grossly intact  EKG - nsr with p  synchronous Biv pacing  DEVICE  Normal device function.  See PaceArt for details.   Assess/Plan: 1. Atrial flutter - she has undergone EP study and catheter ablation and is maintaining NSR.  2. Chronic systolic heart failure - she has class 2A symptoms. She will continue her diuretic. She has some edema but this has gotten better with the lasix. She will reduce her salt intake. I have encouraged the patient to increase her physical activity. 3. BiV ICD - her St. Jude Biv ICD is working normally with a good LV threshold.  4. coags - she will continue her anti-coagulation for now but might be able to come off of the if we can keep her in NSR. 5. Atrial fib - she has had none since her ablation.  [name]

## 2016-08-28 NOTE — Patient Instructions (Signed)
Medication Instructions:  Your physician recommends that you continue on your current medications as directed. Please refer to the Current Medication list given to you today.   Labwork: None ordered.   Testing/Procedures: None ordered.   Follow-Up: Your physician wants you to follow-up in: 4 months with Dr. Lovena Le.   You will receive a reminder letter in the mail two months in advance. If you don't receive a letter, please call our office to schedule the follow-up appointment.  Remote monitoring is used to monitor your ICD from home. This monitoring reduces the number of office visits required to check your device to one time per year. It allows [place] to keep an eye on the functioning of your device to ensure it is working properly. You are scheduled for a device check from home on 09/24/2016. You may send your transmission at any time that day. If you have a wireless device, the transmission will be sent automatically. After your physician reviews your transmission, you will receive a postcard with your next transmission date.   Any Other Special Instructions Will Be Listed Below (If Applicable).     If you need a refill on your cardiac medications before your next appointment, please call your pharmacy.

## 2016-08-31 DIAGNOSIS — E1165 Type 2 diabetes mellitus with hyperglycemia: Secondary | ICD-10-CM | POA: Diagnosis not present

## 2016-08-31 DIAGNOSIS — Z833 Family history of diabetes mellitus: Secondary | ICD-10-CM | POA: Diagnosis not present

## 2016-08-31 DIAGNOSIS — N183 Chronic kidney disease, stage 3 (moderate): Secondary | ICD-10-CM | POA: Diagnosis not present

## 2016-08-31 DIAGNOSIS — E559 Vitamin D deficiency, unspecified: Secondary | ICD-10-CM | POA: Diagnosis not present

## 2016-08-31 DIAGNOSIS — E782 Mixed hyperlipidemia: Secondary | ICD-10-CM | POA: Diagnosis not present

## 2016-08-31 DIAGNOSIS — I129 Hypertensive chronic kidney disease with stage 1 through stage 4 chronic kidney disease, or unspecified chronic kidney disease: Secondary | ICD-10-CM | POA: Diagnosis not present

## 2016-09-24 ENCOUNTER — Ambulatory Visit (INDEPENDENT_AMBULATORY_CARE_PROVIDER_SITE_OTHER): Payer: Medicare Other | Admitting: *Deleted

## 2016-09-24 DIAGNOSIS — I5022 Chronic systolic (congestive) heart failure: Secondary | ICD-10-CM | POA: Diagnosis not present

## 2016-09-24 DIAGNOSIS — E1165 Type 2 diabetes mellitus with hyperglycemia: Secondary | ICD-10-CM | POA: Diagnosis not present

## 2016-09-24 DIAGNOSIS — I428 Other cardiomyopathies: Secondary | ICD-10-CM | POA: Diagnosis not present

## 2016-09-24 DIAGNOSIS — Z95810 Presence of automatic (implantable) cardiac defibrillator: Secondary | ICD-10-CM

## 2016-09-24 NOTE — Progress Notes (Signed)
Remote defibrillator check.

## 2016-09-25 LAB — CUP PACEART REMOTE DEVICE CHECK
Battery Voltage: 2.99 V
Brady Statistic AP VP Percent: 59 %
Brady Statistic AP VS Percent: 1 %
Brady Statistic RA Percent Paced: 58 %
Date Time Interrogation Session: 20180813060017
HighPow Impedance: 73 Ohm
HighPow Impedance: 73 Ohm
Implantable Lead Implant Date: 20170803
Implantable Lead Location: 753859
Implantable Pulse Generator Implant Date: 20170803
Lead Channel Impedance Value: 480 Ohm
Lead Channel Pacing Threshold Amplitude: 0.75 V
Lead Channel Pacing Threshold Amplitude: 0.75 V
Lead Channel Pacing Threshold Pulse Width: 0.5 ms
Lead Channel Pacing Threshold Pulse Width: 0.5 ms
Lead Channel Sensing Intrinsic Amplitude: 11.7 mV
Lead Channel Setting Pacing Amplitude: 2 V
Lead Channel Setting Pacing Amplitude: 2.5 V
Lead Channel Setting Pacing Pulse Width: 0.5 ms
Lead Channel Setting Sensing Sensitivity: 0.5 mV
MDC IDC LEAD IMPLANT DT: 20170803
MDC IDC LEAD IMPLANT DT: 20170803
MDC IDC LEAD LOCATION: 753858
MDC IDC LEAD LOCATION: 753860
MDC IDC MSMT BATTERY REMAINING LONGEVITY: 66 mo
MDC IDC MSMT BATTERY REMAINING PERCENTAGE: 82 %
MDC IDC MSMT LEADCHNL LV IMPEDANCE VALUE: 410 Ohm
MDC IDC MSMT LEADCHNL RA PACING THRESHOLD AMPLITUDE: 0.75 V
MDC IDC MSMT LEADCHNL RA PACING THRESHOLD PULSEWIDTH: 0.5 ms
MDC IDC MSMT LEADCHNL RA SENSING INTR AMPL: 0.8 mV
MDC IDC MSMT LEADCHNL RV IMPEDANCE VALUE: 400 Ohm
MDC IDC SET LEADCHNL LV PACING PULSEWIDTH: 0.5 ms
MDC IDC SET LEADCHNL RV PACING AMPLITUDE: 2.5 V
MDC IDC STAT BRADY AS VP PERCENT: 41 %
MDC IDC STAT BRADY AS VS PERCENT: 1 %
Pulse Gen Serial Number: 7348146

## 2016-09-28 DIAGNOSIS — E1122 Type 2 diabetes mellitus with diabetic chronic kidney disease: Secondary | ICD-10-CM | POA: Diagnosis not present

## 2016-09-28 DIAGNOSIS — E1165 Type 2 diabetes mellitus with hyperglycemia: Secondary | ICD-10-CM | POA: Diagnosis not present

## 2016-09-28 DIAGNOSIS — E782 Mixed hyperlipidemia: Secondary | ICD-10-CM | POA: Diagnosis not present

## 2016-09-28 DIAGNOSIS — Z833 Family history of diabetes mellitus: Secondary | ICD-10-CM | POA: Diagnosis not present

## 2016-09-28 DIAGNOSIS — I129 Hypertensive chronic kidney disease with stage 1 through stage 4 chronic kidney disease, or unspecified chronic kidney disease: Secondary | ICD-10-CM | POA: Diagnosis not present

## 2016-09-28 DIAGNOSIS — N183 Chronic kidney disease, stage 3 (moderate): Secondary | ICD-10-CM | POA: Diagnosis not present

## 2016-09-28 DIAGNOSIS — E559 Vitamin D deficiency, unspecified: Secondary | ICD-10-CM | POA: Diagnosis not present

## 2016-10-02 DIAGNOSIS — L570 Actinic keratosis: Secondary | ICD-10-CM | POA: Diagnosis not present

## 2016-10-02 DIAGNOSIS — X32XXXA Exposure to sunlight, initial encounter: Secondary | ICD-10-CM | POA: Diagnosis not present

## 2016-10-02 DIAGNOSIS — I872 Venous insufficiency (chronic) (peripheral): Secondary | ICD-10-CM | POA: Diagnosis not present

## 2016-10-02 DIAGNOSIS — D225 Melanocytic nevi of trunk: Secondary | ICD-10-CM | POA: Diagnosis not present

## 2016-10-04 ENCOUNTER — Encounter: Payer: Self-pay | Admitting: Cardiology

## 2016-10-16 DIAGNOSIS — X32XXXD Exposure to sunlight, subsequent encounter: Secondary | ICD-10-CM | POA: Diagnosis not present

## 2016-10-16 DIAGNOSIS — L570 Actinic keratosis: Secondary | ICD-10-CM | POA: Diagnosis not present

## 2016-10-22 DIAGNOSIS — E1165 Type 2 diabetes mellitus with hyperglycemia: Secondary | ICD-10-CM | POA: Diagnosis not present

## 2016-11-05 DIAGNOSIS — Z23 Encounter for immunization: Secondary | ICD-10-CM | POA: Diagnosis not present

## 2016-11-05 DIAGNOSIS — E559 Vitamin D deficiency, unspecified: Secondary | ICD-10-CM | POA: Diagnosis not present

## 2016-11-05 DIAGNOSIS — E1122 Type 2 diabetes mellitus with diabetic chronic kidney disease: Secondary | ICD-10-CM | POA: Diagnosis not present

## 2016-11-05 DIAGNOSIS — I129 Hypertensive chronic kidney disease with stage 1 through stage 4 chronic kidney disease, or unspecified chronic kidney disease: Secondary | ICD-10-CM | POA: Diagnosis not present

## 2016-11-05 DIAGNOSIS — E782 Mixed hyperlipidemia: Secondary | ICD-10-CM | POA: Diagnosis not present

## 2016-11-05 DIAGNOSIS — N183 Chronic kidney disease, stage 3 (moderate): Secondary | ICD-10-CM | POA: Diagnosis not present

## 2016-11-05 DIAGNOSIS — E1165 Type 2 diabetes mellitus with hyperglycemia: Secondary | ICD-10-CM | POA: Diagnosis not present

## 2016-12-24 ENCOUNTER — Ambulatory Visit (INDEPENDENT_AMBULATORY_CARE_PROVIDER_SITE_OTHER): Payer: Medicare Other | Admitting: *Deleted

## 2016-12-24 DIAGNOSIS — I428 Other cardiomyopathies: Secondary | ICD-10-CM

## 2016-12-24 NOTE — Progress Notes (Signed)
Remote ICD transmission.   

## 2016-12-25 LAB — CUP PACEART REMOTE DEVICE CHECK
Battery Remaining Longevity: 62 mo
Battery Remaining Percentage: 80 %
Brady Statistic AP VP Percent: 61 %
Brady Statistic AS VP Percent: 39 %
Brady Statistic AS VS Percent: 1 %
Brady Statistic RA Percent Paced: 61 %
HIGH POWER IMPEDANCE MEASURED VALUE: 71 Ohm
HighPow Impedance: 71 Ohm
Implantable Lead Implant Date: 20170803
Implantable Lead Implant Date: 20170803
Implantable Lead Location: 753858
Implantable Lead Location: 753860
Implantable Lead Model: 4296
Lead Channel Impedance Value: 400 Ohm
Lead Channel Impedance Value: 430 Ohm
Lead Channel Pacing Threshold Amplitude: 0.75 V
Lead Channel Pacing Threshold Amplitude: 0.75 V
Lead Channel Pacing Threshold Amplitude: 0.875 V
Lead Channel Pacing Threshold Pulse Width: 0.5 ms
Lead Channel Pacing Threshold Pulse Width: 0.5 ms
Lead Channel Pacing Threshold Pulse Width: 0.5 ms
Lead Channel Sensing Intrinsic Amplitude: 11.7 mV
Lead Channel Setting Pacing Amplitude: 2.5 V
Lead Channel Setting Pacing Amplitude: 2.5 V
Lead Channel Setting Pacing Pulse Width: 0.5 ms
MDC IDC LEAD IMPLANT DT: 20170803
MDC IDC LEAD LOCATION: 753859
MDC IDC MSMT BATTERY VOLTAGE: 2.98 V
MDC IDC MSMT LEADCHNL RA IMPEDANCE VALUE: 460 Ohm
MDC IDC MSMT LEADCHNL RA SENSING INTR AMPL: 0.8 mV
MDC IDC PG IMPLANT DT: 20170803
MDC IDC PG SERIAL: 7348146
MDC IDC SESS DTM: 20181112070017
MDC IDC SET LEADCHNL LV PACING AMPLITUDE: 2 V
MDC IDC SET LEADCHNL RV PACING PULSEWIDTH: 0.5 ms
MDC IDC SET LEADCHNL RV SENSING SENSITIVITY: 0.5 mV
MDC IDC STAT BRADY AP VS PERCENT: 1 %

## 2016-12-28 ENCOUNTER — Encounter: Payer: Self-pay | Admitting: Cardiology

## 2017-01-02 ENCOUNTER — Encounter: Payer: Self-pay | Admitting: Internal Medicine

## 2017-01-02 ENCOUNTER — Ambulatory Visit (INDEPENDENT_AMBULATORY_CARE_PROVIDER_SITE_OTHER): Payer: Medicare Other | Admitting: Internal Medicine

## 2017-01-02 VITALS — BP 124/70 | HR 65 | Ht 67.0 in | Wt 185.0 lb

## 2017-01-02 DIAGNOSIS — Z95810 Presence of automatic (implantable) cardiac defibrillator: Secondary | ICD-10-CM

## 2017-01-02 DIAGNOSIS — I428 Other cardiomyopathies: Secondary | ICD-10-CM | POA: Diagnosis not present

## 2017-01-02 DIAGNOSIS — I4892 Unspecified atrial flutter: Secondary | ICD-10-CM | POA: Diagnosis not present

## 2017-01-02 DIAGNOSIS — I5022 Chronic systolic (congestive) heart failure: Secondary | ICD-10-CM | POA: Diagnosis not present

## 2017-01-02 LAB — CUP PACEART INCLINIC DEVICE CHECK
Battery Remaining Longevity: 62 mo
Brady Statistic RA Percent Paced: 60 %
Brady Statistic RV Percent Paced: 99.73 %
Date Time Interrogation Session: 20181121112124
HighPow Impedance: 66.375
Implantable Lead Implant Date: 20170803
Implantable Lead Implant Date: 20170803
Implantable Lead Location: 753859
Implantable Pulse Generator Implant Date: 20170803
Lead Channel Impedance Value: 400 Ohm
Lead Channel Pacing Threshold Amplitude: 0.75 V
Lead Channel Pacing Threshold Amplitude: 0.75 V
Lead Channel Pacing Threshold Amplitude: 0.75 V
Lead Channel Pacing Threshold Amplitude: 1 V
Lead Channel Pacing Threshold Amplitude: 1 V
Lead Channel Pacing Threshold Pulse Width: 0.5 ms
Lead Channel Pacing Threshold Pulse Width: 0.5 ms
Lead Channel Sensing Intrinsic Amplitude: 11.7 mV
Lead Channel Setting Pacing Amplitude: 2 V
Lead Channel Setting Pacing Amplitude: 2.5 V
Lead Channel Setting Pacing Amplitude: 2.5 V
Lead Channel Setting Pacing Pulse Width: 0.5 ms
Lead Channel Setting Pacing Pulse Width: 0.5 ms
MDC IDC LEAD IMPLANT DT: 20170803
MDC IDC LEAD LOCATION: 753858
MDC IDC LEAD LOCATION: 753860
MDC IDC MSMT LEADCHNL LV IMPEDANCE VALUE: 425 Ohm
MDC IDC MSMT LEADCHNL LV PACING THRESHOLD PULSEWIDTH: 0.5 ms
MDC IDC MSMT LEADCHNL LV PACING THRESHOLD PULSEWIDTH: 0.5 ms
MDC IDC MSMT LEADCHNL RA IMPEDANCE VALUE: 487.5 Ohm
MDC IDC MSMT LEADCHNL RA PACING THRESHOLD PULSEWIDTH: 0.5 ms
MDC IDC MSMT LEADCHNL RA SENSING INTR AMPL: 1 mV
MDC IDC MSMT LEADCHNL RV PACING THRESHOLD AMPLITUDE: 0.75 V
MDC IDC MSMT LEADCHNL RV PACING THRESHOLD PULSEWIDTH: 0.5 ms
MDC IDC PG SERIAL: 7348146
MDC IDC SET LEADCHNL RV SENSING SENSITIVITY: 0.5 mV

## 2017-01-02 MED ORDER — AMIODARONE HCL 200 MG PO TABS: 100.0000 mg | ORAL_TABLET | Freq: Every day | ORAL | 3 refills | Status: DC

## 2017-01-02 MED ORDER — APIXABAN 5 MG PO TABS: 5.0000 mg | ORAL_TABLET | Freq: Two times a day (BID) | ORAL | 3 refills | Status: DC

## 2017-01-02 MED ORDER — CARVEDILOL 12.5 MG PO TABS: 12.5000 mg | ORAL_TABLET | Freq: Two times a day (BID) | ORAL | 3 refills | Status: DC

## 2017-01-02 MED ORDER — FUROSEMIDE 40 MG PO TABS: 40.0000 mg | ORAL_TABLET | Freq: Every day | ORAL | 3 refills | Status: DC

## 2017-01-02 NOTE — Progress Notes (Signed)
HPI Janet Owen returns today for ongoing evaluation and management of her ischemic cardiomyopathy, status post biventricular ICD insertion, with underlying atrial arrhythmias. She underwent catheter ablation of atrial flutter approximately 5 months ago. She has done well in the interim and denies chest pain or shortness of breath or peripheral edema. No ICD shock. Allergies  Allergen Reactions  . Exenatide Nausea Only    Dizziness  . Raloxifene Other (See Comments)    hot flashes     Current Outpatient Medications  Medication Sig Dispense Refill  . amiodarone (PACERONE) 200 MG tablet Take 100 mg by mouth daily.   0  . apixaban (ELIQUIS) 5 MG TABS tablet Take 5 mg by mouth 2 (two) times daily.    Marland Kitchen atorvastatin (LIPITOR) 20 MG tablet Take 20 mg by mouth daily.    . carvedilol (COREG) 12.5 MG tablet Take 12.5 mg by mouth 2 (two) times daily with a meal.    . doxylamine, Sleep, (UNISOM) 25 MG tablet Take 25 mg by mouth at bedtime.     Marland Kitchen ezetimibe (ZETIA) 10 MG tablet Take one-half tablet (5 mg) by mouth daily    . furosemide (LASIX) 40 MG tablet Take 1 tablet (40 mg total) by mouth daily. 90 tablet 3  . ibuprofen (ADVIL,MOTRIN) 200 MG tablet Take 400 mg by mouth every 6 (six) hours as needed for mild pain or moderate pain.    Marland Kitchen JANUVIA 50 MG tablet Take 1 tablet by mouth daily.  2  . metFORMIN (GLUCOPHAGE) 500 MG tablet Take 1 tablet by mouth 2 (two) times daily with a meal.  2  . Multiple Vitamin (MULTIVITAMIN) capsule Take 1 capsule by mouth daily as needed (Vitamin supplement).     . nateglinide (STARLIX) 120 MG tablet Take 1 tablet by mouth 3 (three) times daily before meals. For diabetes    . PROCTOZONE-HC 2.5 % rectal cream Place 1 application rectally as needed for hemorrhoids (Use as directed).   2   No current facility-administered medications for this visit.      Past Medical History:  Diagnosis Date  . Atrial fibrillation ([place])   . DM (diabetes mellitus) ([place])    . Heart failure ([place])   . Hypertension     ROS:   All systems reviewed and negative except as noted in the HPI.   Past Surgical History:  Procedure Laterality Date  . A-FLUTTER ABLATION N/A 07/30/2016   Procedure: A-Flutter Ablation;  Surgeon: Evans Lance, MD;  Location: [facility];  Service: Cardiovascular;  Laterality: N/A;  . APPENDECTOMY    . BACK SURGERY    . EP IMPLANTABLE DEVICE N/A 11/11/2015   Procedure: BiVi Upgrade;  Surgeon: Evans Lance, MD;  Location: [facility];  Service: Cardiovascular;  Laterality: N/A;  . EP IMPLANTABLE DEVICE N/A 09/15/2015   Procedure: BiV ICD Insertion CRT-D;  Surgeon: Will Meredith Leeds, MD;  Location: [facility];  Service: Cardiovascular;  Laterality: N/A;  . PARTIAL HYSTERECTOMY    . REPLACEMENT TOTAL KNEE    . VESICOVAGINAL FISTULA CLOSURE W/ TAH       Family History  Problem Relation Age of Onset  . Asthma Mother   . Pancreatitis Mother   . Alcohol abuse Father      Social History   Socioeconomic History  . Marital status: Married    Spouse name: Not on file  . Number of children: Not on file  . Years of  education: Not on file  . Highest education level: Not on file  Social Needs  . Financial resource strain: Not on file  . Food insecurity - worry: Not on file  . Food insecurity - inability: Not on file  . Transportation needs - medical: Not on file  . Transportation needs - non-medical: Not on file  Occupational History  . Occupation: Retired  Tobacco Use  . Smoking status: Never Smoker  . Smokeless tobacco: Never Used  Substance and Sexual Activity  . Alcohol use: No    Alcohol/week: 0.0 oz  . Drug use: No  . Sexual activity: No    Birth control/protection: None  Other Topics Concern  . Not on file  Social History Narrative  . Not on file     BP 124/70   Pulse 65   Ht 5\' 7"  (1.702 m)   Wt 185 lb (83.9 kg)   BMI 28.98 kg/m   Physical Exam:  Well appearing 15 woman,  NAD HEENT: Unremarkable Neck:  6 cm JVD, no thyromegally Lymphatics:  No adenopathy Back:  No CVA tenderness Lungs:  Clear, with no wheezes, rales, or rhonchi HEART:  Regular rate rhythm, no murmurs, no rubs, no clicks Abd:  soft, positive bowel sounds, no organomegally, no rebound, no guarding Ext:  2 plus pulses, no edema, no cyanosis, no clubbing Skin:  No rashes no nodules Neuro:  CN II through XII intact, motor grossly intact   DEVICE  Normal device function.  See PaceArt for details.   Assess/Plan: 1. Chronic systolic heart failure - she is currently class II and is improved status post bi-V ICD insertion 2. Biventricular ICD - her St. Jude device is interrogated today and is working normally. We'll recheck in several months. 3. Atrial flutter - she is status post ablation 5 months ago and has had no recurrent atrial arrhythmias 4. Atrial fibrillation - she has had less than 1 minute of atrial fibrillation since her last ICD interrogation. She will continue 100 mg a day of amiodarone.  [name], M.D.

## 2017-01-02 NOTE — Patient Instructions (Addendum)
Medication Instructions:  Your physician recommends that you continue on your current medications as directed. Please refer to the Current Medication list given to you today.  Labwork: None ordered.  Testing/Procedures: None ordered.  Follow-Up:  Follow up with Dr. Einar Gip in 6 months.   Your physician wants you to follow-up in: one year with Dr. Lovena Le.   You will receive a reminder letter in the mail two months in advance. If you don't receive a letter, please call our office to schedule the follow-up appointment.  Remote monitoring is used to monitor your ICD from home. This monitoring reduces the number of office visits required to check your device to one time per year. It allows [place] to keep an eye on the functioning of your device to ensure it is working properly. You are scheduled for a device check from home on 03/25/2017. You may send your transmission at any time that day. If you have a wireless device, the transmission will be sent automatically. After your physician reviews your transmission, you will receive a postcard with your next transmission date.    Any Other Special Instructions Will Be Listed Below (If Applicable).     If you need a refill on your cardiac medications before your next appointment, please call your pharmacy.

## 2017-02-13 DIAGNOSIS — E1165 Type 2 diabetes mellitus with hyperglycemia: Secondary | ICD-10-CM | POA: Diagnosis not present

## 2017-02-13 DIAGNOSIS — E782 Mixed hyperlipidemia: Secondary | ICD-10-CM | POA: Diagnosis not present

## 2017-02-15 DIAGNOSIS — E782 Mixed hyperlipidemia: Secondary | ICD-10-CM | POA: Diagnosis not present

## 2017-02-15 DIAGNOSIS — I129 Hypertensive chronic kidney disease with stage 1 through stage 4 chronic kidney disease, or unspecified chronic kidney disease: Secondary | ICD-10-CM | POA: Diagnosis not present

## 2017-02-15 DIAGNOSIS — E559 Vitamin D deficiency, unspecified: Secondary | ICD-10-CM | POA: Diagnosis not present

## 2017-02-15 DIAGNOSIS — E1165 Type 2 diabetes mellitus with hyperglycemia: Secondary | ICD-10-CM | POA: Diagnosis not present

## 2017-02-15 DIAGNOSIS — N183 Chronic kidney disease, stage 3 (moderate): Secondary | ICD-10-CM | POA: Diagnosis not present

## 2017-03-25 ENCOUNTER — Ambulatory Visit (INDEPENDENT_AMBULATORY_CARE_PROVIDER_SITE_OTHER): Payer: Medicare Other | Admitting: *Deleted

## 2017-03-25 DIAGNOSIS — I428 Other cardiomyopathies: Secondary | ICD-10-CM | POA: Diagnosis not present

## 2017-03-26 NOTE — Progress Notes (Signed)
Remote ICD transmission.   

## 2017-03-27 ENCOUNTER — Encounter: Payer: Self-pay | Admitting: Cardiology

## 2017-04-05 LAB — CUP PACEART REMOTE DEVICE CHECK
Battery Remaining Percentage: 76 %
Battery Voltage: 2.98 V
Brady Statistic RA Percent Paced: 62 %
HIGH POWER IMPEDANCE MEASURED VALUE: 70 Ohm
HighPow Impedance: 70 Ohm
Implantable Lead Implant Date: 20170803
Implantable Lead Location: 753859
Implantable Lead Location: 753860
Implantable Lead Model: 4296
Implantable Pulse Generator Implant Date: 20170803
Lead Channel Impedance Value: 380 Ohm
Lead Channel Impedance Value: 410 Ohm
Lead Channel Pacing Threshold Amplitude: 0.75 V
Lead Channel Pacing Threshold Amplitude: 0.75 V
Lead Channel Pacing Threshold Amplitude: 0.875 V
Lead Channel Pacing Threshold Pulse Width: 0.5 ms
Lead Channel Sensing Intrinsic Amplitude: 11.7 mV
Lead Channel Setting Pacing Amplitude: 2 V
Lead Channel Setting Pacing Amplitude: 2.5 V
Lead Channel Setting Pacing Pulse Width: 0.5 ms
Lead Channel Setting Pacing Pulse Width: 0.5 ms
Lead Channel Setting Sensing Sensitivity: 0.5 mV
MDC IDC LEAD IMPLANT DT: 20170803
MDC IDC LEAD IMPLANT DT: 20170803
MDC IDC LEAD LOCATION: 753858
MDC IDC MSMT BATTERY REMAINING LONGEVITY: 59 mo
MDC IDC MSMT LEADCHNL LV PACING THRESHOLD PULSEWIDTH: 0.5 ms
MDC IDC MSMT LEADCHNL RA IMPEDANCE VALUE: 460 Ohm
MDC IDC MSMT LEADCHNL RA SENSING INTR AMPL: 1 mV
MDC IDC MSMT LEADCHNL RV PACING THRESHOLD PULSEWIDTH: 0.5 ms
MDC IDC PG SERIAL: 7348146
MDC IDC SESS DTM: 20190211070018
MDC IDC SET LEADCHNL RV PACING AMPLITUDE: 2.5 V
MDC IDC STAT BRADY AP VP PERCENT: 63 %
MDC IDC STAT BRADY AP VS PERCENT: 1 %
MDC IDC STAT BRADY AS VP PERCENT: 36 %
MDC IDC STAT BRADY AS VS PERCENT: 1 %

## 2017-05-13 DIAGNOSIS — E1165 Type 2 diabetes mellitus with hyperglycemia: Secondary | ICD-10-CM | POA: Diagnosis not present

## 2017-05-13 DIAGNOSIS — E782 Mixed hyperlipidemia: Secondary | ICD-10-CM | POA: Diagnosis not present

## 2017-05-23 DIAGNOSIS — Z6828 Body mass index (BMI) 28.0-28.9, adult: Secondary | ICD-10-CM | POA: Diagnosis not present

## 2017-05-23 DIAGNOSIS — E782 Mixed hyperlipidemia: Secondary | ICD-10-CM | POA: Diagnosis not present

## 2017-05-23 DIAGNOSIS — E559 Vitamin D deficiency, unspecified: Secondary | ICD-10-CM | POA: Diagnosis not present

## 2017-05-23 DIAGNOSIS — I129 Hypertensive chronic kidney disease with stage 1 through stage 4 chronic kidney disease, or unspecified chronic kidney disease: Secondary | ICD-10-CM | POA: Diagnosis not present

## 2017-05-23 DIAGNOSIS — Z780 Asymptomatic menopausal state: Secondary | ICD-10-CM | POA: Diagnosis not present

## 2017-05-23 DIAGNOSIS — N183 Chronic kidney disease, stage 3 (moderate): Secondary | ICD-10-CM | POA: Diagnosis not present

## 2017-05-23 DIAGNOSIS — E663 Overweight: Secondary | ICD-10-CM | POA: Diagnosis not present

## 2017-05-23 DIAGNOSIS — Z8639 Personal history of other endocrine, nutritional and metabolic disease: Secondary | ICD-10-CM | POA: Diagnosis not present

## 2017-05-23 DIAGNOSIS — E1165 Type 2 diabetes mellitus with hyperglycemia: Secondary | ICD-10-CM | POA: Diagnosis not present

## 2017-05-23 DIAGNOSIS — Z95810 Presence of automatic (implantable) cardiac defibrillator: Secondary | ICD-10-CM | POA: Diagnosis not present

## 2017-06-24 ENCOUNTER — Ambulatory Visit (INDEPENDENT_AMBULATORY_CARE_PROVIDER_SITE_OTHER): Payer: Medicare Other | Admitting: *Deleted

## 2017-06-24 DIAGNOSIS — I428 Other cardiomyopathies: Secondary | ICD-10-CM

## 2017-06-25 NOTE — Progress Notes (Signed)
Remote ICD transmission.   

## 2017-06-26 ENCOUNTER — Encounter: Payer: Self-pay | Admitting: Cardiology

## 2017-06-26 LAB — CUP PACEART REMOTE DEVICE CHECK
Battery Remaining Longevity: 55 mo
Battery Remaining Percentage: 73 %
Battery Voltage: 2.96 V
Brady Statistic AS VS Percent: 1 %
Date Time Interrogation Session: 20190513060019
HIGH POWER IMPEDANCE MEASURED VALUE: 68 Ohm
HIGH POWER IMPEDANCE MEASURED VALUE: 68 Ohm
Implantable Lead Implant Date: 20170803
Implantable Lead Implant Date: 20170803
Implantable Lead Implant Date: 20170803
Implantable Lead Location: 753859
Implantable Lead Location: 753860
Implantable Lead Model: 4296
Lead Channel Impedance Value: 400 Ohm
Lead Channel Impedance Value: 400 Ohm
Lead Channel Pacing Threshold Amplitude: 0.75 V
Lead Channel Pacing Threshold Amplitude: 1 V
Lead Channel Pacing Threshold Pulse Width: 0.5 ms
Lead Channel Pacing Threshold Pulse Width: 0.5 ms
Lead Channel Sensing Intrinsic Amplitude: 11.7 mV
Lead Channel Setting Sensing Sensitivity: 0.5 mV
MDC IDC LEAD LOCATION: 753858
MDC IDC MSMT LEADCHNL RA IMPEDANCE VALUE: 460 Ohm
MDC IDC MSMT LEADCHNL RA PACING THRESHOLD AMPLITUDE: 0.75 V
MDC IDC MSMT LEADCHNL RA PACING THRESHOLD PULSEWIDTH: 0.5 ms
MDC IDC MSMT LEADCHNL RA SENSING INTR AMPL: 0.8 mV
MDC IDC PG IMPLANT DT: 20170803
MDC IDC SET LEADCHNL LV PACING AMPLITUDE: 2 V
MDC IDC SET LEADCHNL LV PACING PULSEWIDTH: 0.5 ms
MDC IDC SET LEADCHNL RA PACING AMPLITUDE: 2.5 V
MDC IDC SET LEADCHNL RV PACING AMPLITUDE: 2.5 V
MDC IDC SET LEADCHNL RV PACING PULSEWIDTH: 0.5 ms
MDC IDC STAT BRADY AP VP PERCENT: 64 %
MDC IDC STAT BRADY AP VS PERCENT: 1 %
MDC IDC STAT BRADY AS VP PERCENT: 35 %
MDC IDC STAT BRADY RA PERCENT PACED: 64 %
Pulse Gen Serial Number: 7348146

## 2017-07-05 DIAGNOSIS — Z9889 Other specified postprocedural states: Secondary | ICD-10-CM | POA: Diagnosis not present

## 2017-07-05 DIAGNOSIS — I480 Paroxysmal atrial fibrillation: Secondary | ICD-10-CM | POA: Diagnosis not present

## 2017-07-05 DIAGNOSIS — Z95810 Presence of automatic (implantable) cardiac defibrillator: Secondary | ICD-10-CM | POA: Diagnosis not present

## 2017-07-05 DIAGNOSIS — I5042 Chronic combined systolic (congestive) and diastolic (congestive) heart failure: Secondary | ICD-10-CM | POA: Diagnosis not present

## 2017-07-19 ENCOUNTER — Telehealth: Payer: Self-pay

## 2017-07-19 NOTE — Telephone Encounter (Signed)
Spoke with Pt regarding transfer of care for her device from Dr. Lovena Le to Dr. Einar Gip.  Per patient that is what she would like to do. Advised Dr. Einar Gip office, they will start monitoring pt device. No further action at this time.

## 2017-08-08 DIAGNOSIS — I5042 Chronic combined systolic (congestive) and diastolic (congestive) heart failure: Secondary | ICD-10-CM | POA: Diagnosis not present

## 2017-08-14 DIAGNOSIS — I480 Paroxysmal atrial fibrillation: Secondary | ICD-10-CM | POA: Diagnosis not present

## 2017-08-14 DIAGNOSIS — I428 Other cardiomyopathies: Secondary | ICD-10-CM | POA: Diagnosis not present

## 2017-08-14 DIAGNOSIS — I5042 Chronic combined systolic (congestive) and diastolic (congestive) heart failure: Secondary | ICD-10-CM | POA: Diagnosis not present

## 2017-08-14 DIAGNOSIS — Z9889 Other specified postprocedural states: Secondary | ICD-10-CM | POA: Diagnosis not present

## 2017-08-20 DIAGNOSIS — E782 Mixed hyperlipidemia: Secondary | ICD-10-CM | POA: Diagnosis not present

## 2017-08-20 DIAGNOSIS — E1165 Type 2 diabetes mellitus with hyperglycemia: Secondary | ICD-10-CM | POA: Diagnosis not present

## 2017-08-23 DIAGNOSIS — E782 Mixed hyperlipidemia: Secondary | ICD-10-CM | POA: Diagnosis not present

## 2017-08-23 DIAGNOSIS — E663 Overweight: Secondary | ICD-10-CM | POA: Diagnosis not present

## 2017-08-23 DIAGNOSIS — E1165 Type 2 diabetes mellitus with hyperglycemia: Secondary | ICD-10-CM | POA: Diagnosis not present

## 2017-08-23 DIAGNOSIS — Z95810 Presence of automatic (implantable) cardiac defibrillator: Secondary | ICD-10-CM | POA: Diagnosis not present

## 2017-08-23 DIAGNOSIS — I129 Hypertensive chronic kidney disease with stage 1 through stage 4 chronic kidney disease, or unspecified chronic kidney disease: Secondary | ICD-10-CM | POA: Diagnosis not present

## 2017-08-23 DIAGNOSIS — N183 Chronic kidney disease, stage 3 (moderate): Secondary | ICD-10-CM | POA: Diagnosis not present

## 2017-08-23 DIAGNOSIS — E559 Vitamin D deficiency, unspecified: Secondary | ICD-10-CM | POA: Diagnosis not present

## 2017-08-23 DIAGNOSIS — Z7984 Long term (current) use of oral hypoglycemic drugs: Secondary | ICD-10-CM | POA: Diagnosis not present

## 2017-08-23 DIAGNOSIS — E1122 Type 2 diabetes mellitus with diabetic chronic kidney disease: Secondary | ICD-10-CM | POA: Diagnosis not present

## 2017-09-05 ENCOUNTER — Telehealth: Payer: Self-pay | Admitting: Cardiology

## 2017-09-05 NOTE — Telephone Encounter (Signed)
Called pt and she confirmed that she did want her remote monitoring released to [facility].

## 2017-09-17 DIAGNOSIS — N183 Chronic kidney disease, stage 3 (moderate): Secondary | ICD-10-CM | POA: Diagnosis not present

## 2017-09-17 DIAGNOSIS — Z0000 Encounter for general adult medical examination without abnormal findings: Secondary | ICD-10-CM | POA: Diagnosis not present

## 2017-09-17 DIAGNOSIS — D631 Anemia in chronic kidney disease: Secondary | ICD-10-CM | POA: Diagnosis not present

## 2017-09-17 DIAGNOSIS — D649 Anemia, unspecified: Secondary | ICD-10-CM | POA: Diagnosis not present

## 2017-09-17 DIAGNOSIS — Z7689 Persons encountering health services in other specified circumstances: Secondary | ICD-10-CM | POA: Diagnosis not present

## 2017-10-17 DIAGNOSIS — I130 Hypertensive heart and chronic kidney disease with heart failure and stage 1 through stage 4 chronic kidney disease, or unspecified chronic kidney disease: Secondary | ICD-10-CM | POA: Diagnosis not present

## 2017-10-17 DIAGNOSIS — N183 Chronic kidney disease, stage 3 (moderate): Secondary | ICD-10-CM | POA: Diagnosis not present

## 2017-10-17 DIAGNOSIS — E1122 Type 2 diabetes mellitus with diabetic chronic kidney disease: Secondary | ICD-10-CM | POA: Diagnosis not present

## 2017-10-17 DIAGNOSIS — Z7984 Long term (current) use of oral hypoglycemic drugs: Secondary | ICD-10-CM | POA: Diagnosis not present

## 2017-10-17 DIAGNOSIS — Z95810 Presence of automatic (implantable) cardiac defibrillator: Secondary | ICD-10-CM | POA: Diagnosis not present

## 2017-10-17 DIAGNOSIS — Z791 Long term (current) use of non-steroidal anti-inflammatories (NSAID): Secondary | ICD-10-CM | POA: Diagnosis not present

## 2017-10-17 DIAGNOSIS — E1165 Type 2 diabetes mellitus with hyperglycemia: Secondary | ICD-10-CM | POA: Diagnosis not present

## 2017-10-17 DIAGNOSIS — D509 Iron deficiency anemia, unspecified: Secondary | ICD-10-CM | POA: Diagnosis not present

## 2017-10-17 DIAGNOSIS — I4892 Unspecified atrial flutter: Secondary | ICD-10-CM | POA: Diagnosis not present

## 2017-10-17 DIAGNOSIS — I428 Other cardiomyopathies: Secondary | ICD-10-CM | POA: Diagnosis not present

## 2017-10-17 DIAGNOSIS — I5022 Chronic systolic (congestive) heart failure: Secondary | ICD-10-CM | POA: Diagnosis not present

## 2017-10-22 DIAGNOSIS — N183 Chronic kidney disease, stage 3 (moderate): Secondary | ICD-10-CM | POA: Diagnosis not present

## 2017-10-31 DIAGNOSIS — N183 Chronic kidney disease, stage 3 (moderate): Secondary | ICD-10-CM | POA: Diagnosis not present

## 2017-11-04 DIAGNOSIS — E782 Mixed hyperlipidemia: Secondary | ICD-10-CM | POA: Diagnosis not present

## 2017-11-04 DIAGNOSIS — E1165 Type 2 diabetes mellitus with hyperglycemia: Secondary | ICD-10-CM | POA: Diagnosis not present

## 2017-11-04 DIAGNOSIS — E559 Vitamin D deficiency, unspecified: Secondary | ICD-10-CM | POA: Diagnosis not present

## 2017-11-15 DIAGNOSIS — E559 Vitamin D deficiency, unspecified: Secondary | ICD-10-CM | POA: Diagnosis not present

## 2017-11-15 DIAGNOSIS — Z79899 Other long term (current) drug therapy: Secondary | ICD-10-CM | POA: Diagnosis not present

## 2017-11-15 DIAGNOSIS — Z23 Encounter for immunization: Secondary | ICD-10-CM | POA: Diagnosis not present

## 2017-11-15 DIAGNOSIS — E782 Mixed hyperlipidemia: Secondary | ICD-10-CM | POA: Diagnosis not present

## 2017-11-15 DIAGNOSIS — N183 Chronic kidney disease, stage 3 (moderate): Secondary | ICD-10-CM | POA: Diagnosis not present

## 2017-11-15 DIAGNOSIS — I129 Hypertensive chronic kidney disease with stage 1 through stage 4 chronic kidney disease, or unspecified chronic kidney disease: Secondary | ICD-10-CM | POA: Diagnosis not present

## 2017-11-15 DIAGNOSIS — E1165 Type 2 diabetes mellitus with hyperglycemia: Secondary | ICD-10-CM | POA: Diagnosis not present

## 2017-11-19 DIAGNOSIS — N183 Chronic kidney disease, stage 3 (moderate): Secondary | ICD-10-CM | POA: Diagnosis not present

## 2017-11-19 DIAGNOSIS — E1165 Type 2 diabetes mellitus with hyperglycemia: Secondary | ICD-10-CM | POA: Diagnosis not present

## 2017-11-19 DIAGNOSIS — E1122 Type 2 diabetes mellitus with diabetic chronic kidney disease: Secondary | ICD-10-CM | POA: Diagnosis not present

## 2017-11-19 DIAGNOSIS — I130 Hypertensive heart and chronic kidney disease with heart failure and stage 1 through stage 4 chronic kidney disease, or unspecified chronic kidney disease: Secondary | ICD-10-CM | POA: Diagnosis not present

## 2017-11-19 DIAGNOSIS — Z7984 Long term (current) use of oral hypoglycemic drugs: Secondary | ICD-10-CM | POA: Diagnosis not present

## 2017-11-19 DIAGNOSIS — N179 Acute kidney failure, unspecified: Secondary | ICD-10-CM | POA: Diagnosis not present

## 2017-11-19 DIAGNOSIS — I5022 Chronic systolic (congestive) heart failure: Secondary | ICD-10-CM | POA: Diagnosis not present

## 2017-12-09 DIAGNOSIS — Z4502 Encounter for adjustment and management of automatic implantable cardiac defibrillator: Secondary | ICD-10-CM | POA: Diagnosis not present

## 2017-12-09 DIAGNOSIS — I428 Other cardiomyopathies: Secondary | ICD-10-CM | POA: Diagnosis not present

## 2017-12-09 DIAGNOSIS — Z95810 Presence of automatic (implantable) cardiac defibrillator: Secondary | ICD-10-CM | POA: Diagnosis not present

## 2017-12-09 DIAGNOSIS — I5042 Chronic combined systolic (congestive) and diastolic (congestive) heart failure: Secondary | ICD-10-CM | POA: Diagnosis not present

## 2017-12-20 DIAGNOSIS — E1122 Type 2 diabetes mellitus with diabetic chronic kidney disease: Secondary | ICD-10-CM | POA: Diagnosis not present

## 2017-12-20 DIAGNOSIS — E1165 Type 2 diabetes mellitus with hyperglycemia: Secondary | ICD-10-CM | POA: Diagnosis not present

## 2017-12-20 DIAGNOSIS — N183 Chronic kidney disease, stage 3 (moderate): Secondary | ICD-10-CM | POA: Diagnosis not present

## 2017-12-24 DIAGNOSIS — E1165 Type 2 diabetes mellitus with hyperglycemia: Secondary | ICD-10-CM | POA: Diagnosis not present

## 2017-12-24 DIAGNOSIS — E559 Vitamin D deficiency, unspecified: Secondary | ICD-10-CM | POA: Diagnosis not present

## 2017-12-24 DIAGNOSIS — N183 Chronic kidney disease, stage 3 (moderate): Secondary | ICD-10-CM | POA: Diagnosis not present

## 2017-12-24 DIAGNOSIS — I129 Hypertensive chronic kidney disease with stage 1 through stage 4 chronic kidney disease, or unspecified chronic kidney disease: Secondary | ICD-10-CM | POA: Diagnosis not present

## 2017-12-24 DIAGNOSIS — E782 Mixed hyperlipidemia: Secondary | ICD-10-CM | POA: Diagnosis not present

## 2018-01-02 DIAGNOSIS — N179 Acute kidney failure, unspecified: Secondary | ICD-10-CM | POA: Diagnosis not present

## 2018-01-02 DIAGNOSIS — N183 Chronic kidney disease, stage 3 (moderate): Secondary | ICD-10-CM | POA: Diagnosis not present

## 2018-01-02 DIAGNOSIS — E1122 Type 2 diabetes mellitus with diabetic chronic kidney disease: Secondary | ICD-10-CM | POA: Diagnosis not present

## 2018-01-02 DIAGNOSIS — Z794 Long term (current) use of insulin: Secondary | ICD-10-CM | POA: Diagnosis not present

## 2018-01-02 DIAGNOSIS — I5022 Chronic systolic (congestive) heart failure: Secondary | ICD-10-CM | POA: Diagnosis not present

## 2018-01-02 DIAGNOSIS — I130 Hypertensive heart and chronic kidney disease with heart failure and stage 1 through stage 4 chronic kidney disease, or unspecified chronic kidney disease: Secondary | ICD-10-CM | POA: Diagnosis not present

## 2018-01-07 DIAGNOSIS — I429 Cardiomyopathy, unspecified: Secondary | ICD-10-CM | POA: Diagnosis not present

## 2018-01-07 DIAGNOSIS — Z95810 Presence of automatic (implantable) cardiac defibrillator: Secondary | ICD-10-CM | POA: Diagnosis not present

## 2018-01-07 DIAGNOSIS — I480 Paroxysmal atrial fibrillation: Secondary | ICD-10-CM | POA: Diagnosis not present

## 2018-01-07 DIAGNOSIS — Z4502 Encounter for adjustment and management of automatic implantable cardiac defibrillator: Secondary | ICD-10-CM | POA: Diagnosis not present

## 2018-02-07 DIAGNOSIS — Z4502 Encounter for adjustment and management of automatic implantable cardiac defibrillator: Secondary | ICD-10-CM | POA: Diagnosis not present

## 2018-02-07 DIAGNOSIS — I480 Paroxysmal atrial fibrillation: Secondary | ICD-10-CM | POA: Diagnosis not present

## 2018-02-07 DIAGNOSIS — Z95810 Presence of automatic (implantable) cardiac defibrillator: Secondary | ICD-10-CM | POA: Diagnosis not present

## 2018-02-07 DIAGNOSIS — I429 Cardiomyopathy, unspecified: Secondary | ICD-10-CM | POA: Diagnosis not present

## 2018-04-10 DIAGNOSIS — Z4502 Encounter for adjustment and management of automatic implantable cardiac defibrillator: Secondary | ICD-10-CM

## 2018-04-10 DIAGNOSIS — I429 Cardiomyopathy, unspecified: Secondary | ICD-10-CM

## 2018-04-10 DIAGNOSIS — Z95810 Presence of automatic (implantable) cardiac defibrillator: Secondary | ICD-10-CM

## 2018-04-10 DIAGNOSIS — I480 Paroxysmal atrial fibrillation: Secondary | ICD-10-CM | POA: Diagnosis not present

## 2018-04-13 ENCOUNTER — Telehealth: Payer: Self-pay | Admitting: Cardiology

## 2018-05-08 DIAGNOSIS — I429 Cardiomyopathy, unspecified: Secondary | ICD-10-CM

## 2018-05-08 DIAGNOSIS — Z95810 Presence of automatic (implantable) cardiac defibrillator: Secondary | ICD-10-CM

## 2018-05-08 DIAGNOSIS — Z4502 Encounter for adjustment and management of automatic implantable cardiac defibrillator: Secondary | ICD-10-CM | POA: Diagnosis not present

## 2018-05-08 DIAGNOSIS — I480 Paroxysmal atrial fibrillation: Secondary | ICD-10-CM

## 2018-05-18 ENCOUNTER — Telehealth: Payer: Self-pay | Admitting: Cardiology

## 2018-05-19 ENCOUNTER — Other Ambulatory Visit: Payer: Self-pay | Admitting: Cardiology

## 2018-05-19 DIAGNOSIS — I5022 Chronic systolic (congestive) heart failure: Secondary | ICD-10-CM

## 2018-05-20 NOTE — Telephone Encounter (Signed)
Patient notified of normal ICD/pacemaker function

## 2018-06-08 DIAGNOSIS — Z95810 Presence of automatic (implantable) cardiac defibrillator: Secondary | ICD-10-CM | POA: Diagnosis not present

## 2018-06-08 DIAGNOSIS — I480 Paroxysmal atrial fibrillation: Secondary | ICD-10-CM | POA: Diagnosis not present

## 2018-06-08 DIAGNOSIS — I429 Cardiomyopathy, unspecified: Secondary | ICD-10-CM | POA: Diagnosis not present

## 2018-06-08 DIAGNOSIS — Z4502 Encounter for adjustment and management of automatic implantable cardiac defibrillator: Secondary | ICD-10-CM | POA: Diagnosis not present

## 2018-06-12 ENCOUNTER — Ambulatory Visit: Payer: Medicare Other | Admitting: Cardiology

## 2018-07-02 ENCOUNTER — Ambulatory Visit: Payer: Self-pay | Admitting: Cardiology

## 2018-07-09 ENCOUNTER — Other Ambulatory Visit: Payer: Self-pay | Admitting: Cardiology

## 2018-07-09 NOTE — Telephone Encounter (Signed)
Please fill

## 2018-07-10 DIAGNOSIS — I429 Cardiomyopathy, unspecified: Secondary | ICD-10-CM | POA: Diagnosis not present

## 2018-07-10 DIAGNOSIS — Z95810 Presence of automatic (implantable) cardiac defibrillator: Secondary | ICD-10-CM

## 2018-07-10 DIAGNOSIS — Z4502 Encounter for adjustment and management of automatic implantable cardiac defibrillator: Secondary | ICD-10-CM | POA: Diagnosis not present

## 2018-07-10 DIAGNOSIS — I480 Paroxysmal atrial fibrillation: Secondary | ICD-10-CM

## 2018-07-23 ENCOUNTER — Encounter: Payer: Self-pay | Admitting: Cardiology

## 2018-07-23 DIAGNOSIS — Z95810 Presence of automatic (implantable) cardiac defibrillator: Secondary | ICD-10-CM | POA: Insufficient documentation

## 2018-07-23 DIAGNOSIS — Z4502 Encounter for adjustment and management of automatic implantable cardiac defibrillator: Secondary | ICD-10-CM | POA: Insufficient documentation

## 2018-08-07 DIAGNOSIS — Z4502 Encounter for adjustment and management of automatic implantable cardiac defibrillator: Secondary | ICD-10-CM

## 2018-08-07 DIAGNOSIS — Z95810 Presence of automatic (implantable) cardiac defibrillator: Secondary | ICD-10-CM

## 2018-08-07 DIAGNOSIS — I429 Cardiomyopathy, unspecified: Secondary | ICD-10-CM

## 2018-08-07 DIAGNOSIS — I480 Paroxysmal atrial fibrillation: Secondary | ICD-10-CM

## 2018-08-21 ENCOUNTER — Ambulatory Visit: Payer: Self-pay | Admitting: Cardiology

## 2018-08-24 NOTE — Telephone Encounter (Signed)
Patient aware.

## 2018-09-07 DIAGNOSIS — Z95810 Presence of automatic (implantable) cardiac defibrillator: Secondary | ICD-10-CM

## 2018-09-07 DIAGNOSIS — Z4502 Encounter for adjustment and management of automatic implantable cardiac defibrillator: Secondary | ICD-10-CM

## 2018-09-07 DIAGNOSIS — I429 Cardiomyopathy, unspecified: Secondary | ICD-10-CM

## 2018-09-07 DIAGNOSIS — I480 Paroxysmal atrial fibrillation: Secondary | ICD-10-CM | POA: Diagnosis not present

## 2018-09-17 ENCOUNTER — Telehealth: Payer: Self-pay

## 2018-09-17 NOTE — Telephone Encounter (Signed)
-----   Message from Adrian Prows, MD sent at 09/12/2018  9:47 AM EDT ----- Regarding: ICD Please inform patient that ICD shows recent fluid accumulation and early signs of CHF. To watch out for excessive salt and fluid intake. To call us she is gaining weight > 2 lbs in next one to two weeks or dyspnea.  JG

## 2018-09-17 NOTE — Telephone Encounter (Signed)
Pt aware of transmission  

## 2018-09-24 ENCOUNTER — Ambulatory Visit: Payer: Self-pay | Admitting: Cardiology

## 2018-10-09 DIAGNOSIS — I480 Paroxysmal atrial fibrillation: Secondary | ICD-10-CM | POA: Diagnosis not present

## 2018-10-09 DIAGNOSIS — Z4502 Encounter for adjustment and management of automatic implantable cardiac defibrillator: Secondary | ICD-10-CM | POA: Diagnosis not present

## 2018-10-09 DIAGNOSIS — I429 Cardiomyopathy, unspecified: Secondary | ICD-10-CM | POA: Diagnosis not present

## 2018-10-09 DIAGNOSIS — Z95810 Presence of automatic (implantable) cardiac defibrillator: Secondary | ICD-10-CM | POA: Diagnosis not present

## 2018-11-09 DIAGNOSIS — I5042 Chronic combined systolic (congestive) and diastolic (congestive) heart failure: Secondary | ICD-10-CM | POA: Diagnosis not present

## 2018-11-09 DIAGNOSIS — Z4502 Encounter for adjustment and management of automatic implantable cardiac defibrillator: Secondary | ICD-10-CM

## 2018-11-09 DIAGNOSIS — Z95810 Presence of automatic (implantable) cardiac defibrillator: Secondary | ICD-10-CM | POA: Diagnosis not present

## 2018-12-10 DIAGNOSIS — I480 Paroxysmal atrial fibrillation: Secondary | ICD-10-CM

## 2018-12-10 DIAGNOSIS — Z4502 Encounter for adjustment and management of automatic implantable cardiac defibrillator: Secondary | ICD-10-CM

## 2018-12-10 DIAGNOSIS — Z95810 Presence of automatic (implantable) cardiac defibrillator: Secondary | ICD-10-CM

## 2018-12-10 DIAGNOSIS — I5022 Chronic systolic (congestive) heart failure: Secondary | ICD-10-CM

## 2018-12-11 ENCOUNTER — Ambulatory Visit: Payer: Self-pay

## 2019-01-10 DIAGNOSIS — Z4502 Encounter for adjustment and management of automatic implantable cardiac defibrillator: Secondary | ICD-10-CM

## 2019-01-10 DIAGNOSIS — Z95810 Presence of automatic (implantable) cardiac defibrillator: Secondary | ICD-10-CM | POA: Diagnosis not present

## 2019-01-10 DIAGNOSIS — I5042 Chronic combined systolic (congestive) and diastolic (congestive) heart failure: Secondary | ICD-10-CM | POA: Diagnosis not present

## 2019-02-10 DIAGNOSIS — Z4502 Encounter for adjustment and management of automatic implantable cardiac defibrillator: Secondary | ICD-10-CM

## 2019-02-10 DIAGNOSIS — Z95810 Presence of automatic (implantable) cardiac defibrillator: Secondary | ICD-10-CM

## 2019-02-10 DIAGNOSIS — I5042 Chronic combined systolic (congestive) and diastolic (congestive) heart failure: Secondary | ICD-10-CM

## 2019-02-25 ENCOUNTER — Other Ambulatory Visit: Payer: Self-pay

## 2019-02-25 ENCOUNTER — Encounter: Payer: Self-pay | Admitting: Cardiology

## 2019-02-25 ENCOUNTER — Ambulatory Visit (INDEPENDENT_AMBULATORY_CARE_PROVIDER_SITE_OTHER): Payer: Medicare Other | Admitting: Cardiology

## 2019-02-25 DIAGNOSIS — I5022 Chronic systolic (congestive) heart failure: Secondary | ICD-10-CM | POA: Diagnosis not present

## 2019-02-25 DIAGNOSIS — Z4502 Encounter for adjustment and management of automatic implantable cardiac defibrillator: Secondary | ICD-10-CM | POA: Diagnosis not present

## 2019-02-25 DIAGNOSIS — Z95810 Presence of automatic (implantable) cardiac defibrillator: Secondary | ICD-10-CM

## 2019-02-25 NOTE — Progress Notes (Signed)
Scheduled  In office ICD 02/25/19  Single (S)/Dual (D)/BV (M) BV Presenting AVBV 75/min Pacer dependant: Not. Underlying Glade Stanford @ 5 with 1st degree AVB.  AP 90%, VP NA%. BP 100.% AMS Episodes 0.  HVR 0.  Longevity 3.6 Years/Voltage. Charge time 9.4 Sec. Lead measurements: Stable Histogram: Low (L)/normal (N)/high (H)  Flat. Patient activity Good. Thoracic impedance: Trending up but no trigger for CHF  Observations: normal ICD function.  Changes: Increased slope from 11 to 12 for rate response.   I will set her to be seen in the office in 3 months to f/u on her symptoms and activity as well. Discussed CHF.     ICD-10-CM   1. Encounter for management of biventricular implantable cardioverter-defibrillator (ICD)  Z45.02   2. ICD BV  09/15/2015:  St. Jude Medical [place] model [phone] (serial  Number [id number]) in situ  Z95.810   3. Chronic systolic congestive heart failure (HCC)  I50.22

## 2019-02-26 ENCOUNTER — Ambulatory Visit: Payer: Medicare Other | Attending: Internal Medicine

## 2019-02-26 DIAGNOSIS — Z23 Encounter for immunization: Secondary | ICD-10-CM | POA: Insufficient documentation

## 2019-02-26 NOTE — Progress Notes (Signed)
   Covid-19 Vaccination Clinic  Name:  Janet Owen    MRN: [id number] DOB: 1933/08/19  02/26/2019  [name] was observed post Covid-19 immunization for 15 minutes without incidence. She was provided with Vaccine Information Sheet and instruction to access the V-Safe system.   [name] was instructed to call 911 with any severe reactions post vaccine: Marland Kitchen Difficulty breathing  . Swelling of your face and throat  . A fast heartbeat  . A bad rash all over your body  . Dizziness and weakness    Immunizations Administered    Name Date Dose VIS Date Route   Pfizer COVID-19 Vaccine 02/26/2019  8:29 AM 0.3 mL 01/23/2019 Intramuscular   Manufacturer: [employer], [employer]   Lot: [id number]   [place]: [id number]

## 2019-03-02 ENCOUNTER — Other Ambulatory Visit: Payer: Self-pay | Admitting: Cardiology

## 2019-03-02 NOTE — Telephone Encounter (Signed)
Ok for refill? 

## 2019-03-13 DIAGNOSIS — I5042 Chronic combined systolic (congestive) and diastolic (congestive) heart failure: Secondary | ICD-10-CM | POA: Diagnosis not present

## 2019-03-13 DIAGNOSIS — Z4502 Encounter for adjustment and management of automatic implantable cardiac defibrillator: Secondary | ICD-10-CM | POA: Diagnosis not present

## 2019-03-13 DIAGNOSIS — Z95810 Presence of automatic (implantable) cardiac defibrillator: Secondary | ICD-10-CM | POA: Diagnosis not present

## 2019-03-18 ENCOUNTER — Ambulatory Visit: Payer: Medicare Other | Attending: Internal Medicine

## 2019-03-18 DIAGNOSIS — Z23 Encounter for immunization: Secondary | ICD-10-CM

## 2019-03-18 NOTE — Progress Notes (Signed)
   Covid-19 Vaccination Clinic  Name:  Janet Owen    MRN: [id number] DOB: 1933-06-06  03/18/2019  Janet Owen was observed post Covid-19 immunization for 15 minutes without incidence. She was provided with Vaccine Information Sheet and instruction to access the V-Safe system.   Janet Owen was instructed to call 911 with any severe reactions post vaccine: Marland Kitchen Difficulty breathing  . Swelling of your face and throat  . A fast heartbeat  . A bad rash all over your body  . Dizziness and weakness    Immunizations Administered    Name Date Dose VIS Date Route   Pfizer COVID-19 Vaccine 03/18/2019  8:33 AM 0.3 mL 01/23/2019 Intramuscular   Manufacturer: [place]   Lot: [id number]   [place]: [id number]

## 2019-05-20 ENCOUNTER — Other Ambulatory Visit: Payer: Self-pay | Admitting: Cardiology

## 2019-05-20 DIAGNOSIS — I5022 Chronic systolic (congestive) heart failure: Secondary | ICD-10-CM

## 2019-05-28 ENCOUNTER — Other Ambulatory Visit: Payer: Self-pay

## 2019-05-28 ENCOUNTER — Encounter: Payer: Self-pay | Admitting: Cardiology

## 2019-05-28 ENCOUNTER — Ambulatory Visit: Payer: Medicare Other | Admitting: Cardiology

## 2019-05-28 VITALS — BP 129/75 | HR 75 | Temp 97.2°F | Resp 18 | Ht 67.0 in | Wt 186.0 lb

## 2019-05-28 DIAGNOSIS — I5022 Chronic systolic (congestive) heart failure: Secondary | ICD-10-CM

## 2019-05-28 DIAGNOSIS — N1832 Chronic kidney disease, stage 3b: Secondary | ICD-10-CM

## 2019-05-28 DIAGNOSIS — I428 Other cardiomyopathies: Secondary | ICD-10-CM

## 2019-05-28 DIAGNOSIS — Z5181 Encounter for therapeutic drug level monitoring: Secondary | ICD-10-CM

## 2019-05-28 DIAGNOSIS — Z95810 Presence of automatic (implantable) cardiac defibrillator: Secondary | ICD-10-CM

## 2019-05-28 DIAGNOSIS — I480 Paroxysmal atrial fibrillation: Secondary | ICD-10-CM

## 2019-05-28 NOTE — Progress Notes (Signed)
Primary Physician/Referring:  Bartholome Bill, MD  Patient ID: Janet Owen, female    DOB: Jul 11, 1933, 86 y.o.   MRN: [id number]  Chief Complaint  Patient presents with  . Congestive Heart Failure  . Cardiomyopathy  . Follow-up    3 month   HPI:    KADELYN DIMASCIO  is a 86 y.o. Caucasian female with nonischemic cardiomyopathy diagnosed in 78 in [place] with severe LV systolic dysfunction, EF 61%, paroxysmal atrial fibrillation last documented on 07/07/15, h/o atrial flutter s/p ablation on 07/31/2015, She has bi-V ICD placed on 11/09/2015, by Dr. Crissie Sickles.  Past medical history significant for hypertension, hyperlipidemia, type 2 diabetes mellitus and stage III chronic kidney disease.  This is her annual visit.  States that she is presently doing well except for chronic dyspnea.  Her activity is limited due to back pain.  She has not had any chest pain, no.  Orthopnea.  No dizziness or syncope.  No bleeding diathesis on anticoagulation.  Past Medical History:  Diagnosis Date  . Atrial fibrillation ([place])   . DM (diabetes mellitus) ([place])   . Heart failure ([place])   . Hypertension    Past Surgical History:  Procedure Laterality Date  . A-FLUTTER ABLATION N/A 07/30/2016   Procedure: A-Flutter Ablation;  Surgeon: Evans Lance, MD;  Location: [place] [facility];  Service: Cardiovascular;  Laterality: N/A;  . APPENDECTOMY    . BACK SURGERY    . EP IMPLANTABLE DEVICE N/A 11/11/2015   Procedure: BiVi Upgrade;  Surgeon: Evans Lance, MD;  Location: [place];  Service: Cardiovascular;  Laterality: N/A;  . EP IMPLANTABLE DEVICE N/A 09/15/2015   Procedure: BiV ICD Insertion CRT-D;  Surgeon: Will Meredith Leeds, MD;  Location: [facility];  Service: Cardiovascular;  Laterality: N/A;  . PARTIAL HYSTERECTOMY    . REPLACEMENT TOTAL KNEE    . VESICOVAGINAL FISTULA CLOSURE W/ TAH     Family History  Problem Relation Age of Onset  . Asthma Mother   .  Pancreatitis Mother   . Alcohol abuse Father     Social History   Tobacco Use  . Smoking status: Never Smoker  . Smokeless tobacco: Never Used  Substance Use Topics  . Alcohol use: No    Alcohol/week: 0.0 standard drinks   ROS  Review of Systems  Cardiovascular: Positive for dyspnea on exertion (stable) and leg swelling (occasional). Negative for chest pain.  Musculoskeletal: Positive for back pain and joint pain.  Gastrointestinal: Negative for melena.   Objective  Blood pressure 129/75, pulse 75, temperature (!) 97.2 F (36.2 C), temperature source Temporal, resp. rate 18, height 5' 7"  (1.702 m), weight 186 lb (84.4 kg).  Vitals with BMI 05/28/2019 01/02/2017 08/28/2016  Height 5' 7"  5' 7"  5' 7"   Weight 186 lbs 185 lbs 187 lbs  BMI 29.12 68.37 29.0  Systolic 211 155 208  Diastolic 75 70 70  Pulse 75 65 61     Physical Exam  Cardiovascular: Normal rate, regular rhythm, normal heart sounds and intact distal pulses. Exam reveals no gallop.  No murmur heard. No leg edema, no JVD. Bilateral superficial varicose veins noted.  Pulmonary/Chest: Effort normal and breath sounds normal.  Pacemaker/ICD site noted  in the left infraclavicular fossa.    Abdominal: Soft. Bowel sounds are normal.   Laboratory examination:   TSH No results for input(s): TSH in the last 8760 hours.  External labs:   Labs 01/19/2019: A1c 7.6%.  Vitamin D 29.  Total cholesterol 148, TG 230, HDL 41, LDL 61   Sodium 131, potassium 4.4, BUN 35, S. Cr 1.55, EGFR 31 mL.  CMP otherwise normal.  Hb 15.2/HCT 45.7, platelets 142.  Cholesterol, total 148.000 M 11/18/2015 HDL 50.000 M 11/18/2015 LDL 78 Triglycerides 130.000 M 11/18/2015  A1C 6.800 % 11/18/2015 TSH 2.230 07/05/2017  Hemoglobin 11.000 G/ 07/05/2017 Platelets 209.000 X 07/05/2017  Creatinine, Serum 1.750 MG/ 07/05/2017 Potassium 5.400 07/23/2016 ALT (SGPT) 20.000 IU/ 07/05/2017  Medications and allergies   Allergies  Allergen Reactions  .  Exenatide Nausea Only    Dizziness  . Raloxifene Other (See Comments)    hot flashes    Current Outpatient Medications  Medication Instructions  . amiodarone (PACERONE) 200 MG tablet TAKE 1/2 TABLET BY MOUTH EVERY DAY  . atorvastatin (LIPITOR) 20 mg, Oral, Daily  . carvedilol (COREG) 12.5 MG tablet TAKE 1 TABLET BY MOUTH TWICE DAILY  . doxylamine (Sleep) (UNISOM) 25 mg, Oral, Daily at bedtime  . ezetimibe (ZETIA) 10 MG tablet Take one-half tablet (5 mg) by mouth daily  . furosemide (LASIX) 40 MG tablet TAKE 1 TABLET BY MOUTH DAILY  . glucose blood (ONETOUCH VERIO) test strip USE TO TEST BLOOD SUGAR TWICE DAILY  . Insulin Pen Needle (B-D UF III MINI PEN NEEDLES) 31G X 5 MM MISC Use to inject Toujeo once daily.  Marland Kitchen JANUVIA 50 MG tablet 1 tablet, Oral, Daily  . nateglinide (STARLIX) 120 MG tablet 1 tablet, Oral, 3 times daily before meals, For diabetes   . Psyllium (METAMUCIL FREE & NATURAL) 43 % POWD Oral  . Toujeo SoloStar 10 Units, Subcutaneous, Daily at bedtime   Radiology:   No results found.  Cardiac Studies:   Coronary Angiogram  [02/04/2014]:  Left and right heart catheterization 02/04/2014: Right atrial pressure 10 mmHg. Right ventricular pressure 32/2 with RVEDP of 15 mmHg. PA pressure 29/15 with mean of 22 mmHg. Pulmonary capillary wedge pressure mean of 6 mmHg. PA saturation 64% and aortic saturation 92%. Fick cardiac output of 5.6 L and index 2.9 L/m. LVEF 55%.  Normal coronary arteries.  Echocardiogram  08/08/2017: Left ventricle cavity is normal in size. Moderate concentric hypertrophy of the left ventricle. Abnormal septal wall motion due to left bundle branch block. Doppler evidence of grade II (pseudonormal) diastolic dysfunction, elevated LAP. Calculated EF 43%. Left atrial cavity is severely dilated. Moderate tricuspid regurgitation. Estimated pulmonary artery systolic pressure 33 mmHg. Compared to previous echocardiogram on 06/08/2015, LVEF and mitral  regurgitation improved. Otherwise no significant change.  Scheduled  In office ICD 02/25/19  Single (S)/Dual (D)/BV (M) BV Presenting AVBV 75/min Pacer dependant: Not. Underlying Glade Stanford @ 64 with 1st degree AVB.  AP 90%, VP NA%. BP 100.% AMS Episodes 0.  HVR 0.  Longevity 3.6 Years/Voltage. Charge time 9.4 Sec. Lead measurements: Stable Histogram: Low (L)/normal (N)/high (H)  Flat. Patient activity Good. Thoracic impedance: Trending up but no trigger for CHF  Observations: normal ICD function.  Changes: Increased slope from 11 to 12 for rate response.   Scheduled Remote ICD check 05/07/2019: 14 AHR episodes. EGM suggests brief AT and false mode switch. <1% cumulative atrial arrhythmia burden. No VHR episodes. [place]vue impedance monitoring does not suggest recent fluid accumulation. Battery longevity is 3.1 years. RA pacing is 95.0 %, RV pacing is >99.0 %, and LV pacing is >99.0 %.  EKG  EKG 05/28/2019: Normal sinus rhythm with rate of 73 bpm, biventricular pacemaker detected.  No further analysis.  Assessment     ICD-10-CM   1. Chronic systolic congestive heart failure (HCC)  I50.22 EKG 12-Lead    TSH  2. NICM (nonischemic cardiomyopathy) ([place])  I42.8   3. ICD BV  09/15/2015:  St. Jude Medical [place] model [phone] (serial  Number [id number]) in situ  Z95.810   4. Paroxysmal atrial fibrillation ([place]). CHA2DS2-VASc Score is 6.  Yearly risk of stroke: 9.8% (A, F, HTN, DM, CHF).  I48.0   5. Therapeutic drug monitoring  Z51.81   6. Type 2 diabetes mellitus with stage 3b chronic kidney disease, with long-term current use of insulin (HCC)  E11.21 TSH   N18.32 Hgb A1c w/o eAG   Z79.4     No orders of the defined types were placed in this encounter.   Medications Discontinued During This Encounter  Medication Reason  . apixaban (ELIQUIS) 5 MG TABS tablet Discontinued by provider  . metFORMIN (GLUCOPHAGE) 500 MG tablet Discontinued by provider  . Multiple Vitamin  (MULTIVITAMIN) capsule Discontinued by provider  . PROCTOZONE-HC 2.5 % rectal cream No longer needed (for PRN medications)  . ibuprofen (ADVIL,MOTRIN) 200 MG tablet No longer needed (for PRN medications)    Recommendations:   RHEA KAELIN  is a 86 y.o. Caucasian female with nonischemic cardiomyopathy diagnosed in 50 in [place] with severe LV systolic dysfunction, EF 59%, paroxysmal atrial fibrillation last documented on 07/07/15, h/o atrial flutter s/p ablation on 07/31/2015, She has bi-V ICD placed on 11/09/2015, by Dr. Crissie Sickles.  Past medical history significant for hypertension, hyperlipidemia, type 2 diabetes mellitus and stage III chronic kidney disease.  She is presently doing well without clinical evidence of congestive heart failure.  Dyspnea has remained stable, no PND, trace leg edema and she does have varicose veins.  I reviewed her lipids, recently her diabetes has been uncontrolled, previously triglycerides are very well controlled.  I did not make any changes to her medications although we did discuss adding increasing the physical activity and also watching her carbohydrate intake.  She has an appointment to see Dr. Elyse Hsu soon.  She needs therapeutic drug monitoring of amiodarone, I ordered a TSH and also took the liberty of ordering A1c.  Her external labs including CBC and CMP were reviewed, renal function is remained stable.  ICD was reviewed with the patient, no clinical and also ICD data suggest heart failure and there has been no significant ventricular arrhythmias.  I will certainly forward copy of the labs to her PCP and also to her endocrinologist.  She is presently on anticoagulation for paroxysmal atrial fibrillation, today she maintained sinus rhythm.  Given high cardioembolic risk continue the same.  This is a 40-minute encounter in view of multiple medical, specifically cardiac issues, I will see her back in 6 months for follow-up.  She will continue more  monitoring of her ICD.  Adrian Prows, MD, [facility] 05/28/2019, 11:18 AM [facility]. [place] Office: [phone]

## 2019-05-29 LAB — HGB A1C W/O EAG: Hgb A1c MFr Bld: 7.6 % — ABNORMAL HIGH (ref 4.8–5.6)

## 2019-05-29 LAB — TSH: TSH: 3.34 u[IU]/mL (ref 0.450–4.500)

## 2019-05-29 NOTE — Progress Notes (Signed)
Thyroid hormone is normal. Diabetes is uncontrolled as I had discussed. I have forwarded this to PCP and Dr. Elyse Hsu

## 2019-07-04 ENCOUNTER — Other Ambulatory Visit: Payer: Self-pay | Admitting: Cardiology

## 2019-08-24 ENCOUNTER — Inpatient Hospital Stay (HOSPITAL_COMMUNITY)
Admission: EM | Admit: 2019-08-24 | Discharge: 2019-08-27 | DRG: 522 | Disposition: A | Discharge status: Skilled Nursing Facility | Payer: Medicare Other | Attending: Family Medicine | Admitting: Family Medicine

## 2019-08-24 ENCOUNTER — Emergency Department (HOSPITAL_COMMUNITY): Payer: Medicare Other

## 2019-08-24 ENCOUNTER — Encounter (HOSPITAL_COMMUNITY): Payer: Self-pay

## 2019-08-24 ENCOUNTER — Other Ambulatory Visit: Payer: Self-pay

## 2019-08-24 DIAGNOSIS — D696 Thrombocytopenia, unspecified: Secondary | ICD-10-CM | POA: Diagnosis not present

## 2019-08-24 DIAGNOSIS — E1122 Type 2 diabetes mellitus with diabetic chronic kidney disease: Secondary | ICD-10-CM | POA: Diagnosis present

## 2019-08-24 DIAGNOSIS — Z79899 Other long term (current) drug therapy: Secondary | ICD-10-CM

## 2019-08-24 DIAGNOSIS — S72009A Fracture of unspecified part of neck of unspecified femur, initial encounter for closed fracture: Secondary | ICD-10-CM | POA: Diagnosis present

## 2019-08-24 DIAGNOSIS — Z794 Long term (current) use of insulin: Secondary | ICD-10-CM

## 2019-08-24 DIAGNOSIS — I130 Hypertensive heart and chronic kidney disease with heart failure and stage 1 through stage 4 chronic kidney disease, or unspecified chronic kidney disease: Secondary | ICD-10-CM | POA: Diagnosis present

## 2019-08-24 DIAGNOSIS — I428 Other cardiomyopathies: Secondary | ICD-10-CM | POA: Diagnosis present

## 2019-08-24 DIAGNOSIS — Z888 Allergy status to other drugs, medicaments and biological substances status: Secondary | ICD-10-CM

## 2019-08-24 DIAGNOSIS — N183 Chronic kidney disease, stage 3 unspecified: Secondary | ICD-10-CM | POA: Diagnosis not present

## 2019-08-24 DIAGNOSIS — Z90711 Acquired absence of uterus with remaining cervical stump: Secondary | ICD-10-CM

## 2019-08-24 DIAGNOSIS — I480 Paroxysmal atrial fibrillation: Secondary | ICD-10-CM | POA: Diagnosis present

## 2019-08-24 DIAGNOSIS — Y92000 Kitchen of unspecified non-institutional (private) residence as  the place of occurrence of the external cause: Secondary | ICD-10-CM | POA: Diagnosis not present

## 2019-08-24 DIAGNOSIS — R52 Pain, unspecified: Secondary | ICD-10-CM

## 2019-08-24 DIAGNOSIS — W010XXA Fall on same level from slipping, tripping and stumbling without subsequent striking against object, initial encounter: Secondary | ICD-10-CM | POA: Diagnosis present

## 2019-08-24 DIAGNOSIS — N1832 Chronic kidney disease, stage 3b: Secondary | ICD-10-CM | POA: Diagnosis present

## 2019-08-24 DIAGNOSIS — I10 Essential (primary) hypertension: Secondary | ICD-10-CM | POA: Diagnosis present

## 2019-08-24 DIAGNOSIS — S72001A Fracture of unspecified part of neck of right femur, initial encounter for closed fracture: Secondary | ICD-10-CM | POA: Diagnosis not present

## 2019-08-24 DIAGNOSIS — Z96659 Presence of unspecified artificial knee joint: Secondary | ICD-10-CM | POA: Diagnosis present

## 2019-08-24 DIAGNOSIS — M25551 Pain in right hip: Secondary | ICD-10-CM | POA: Diagnosis present

## 2019-08-24 DIAGNOSIS — E782 Mixed hyperlipidemia: Secondary | ICD-10-CM | POA: Diagnosis present

## 2019-08-24 DIAGNOSIS — I483 Typical atrial flutter: Secondary | ICD-10-CM | POA: Diagnosis present

## 2019-08-24 DIAGNOSIS — S50811A Abrasion of right forearm, initial encounter: Secondary | ICD-10-CM | POA: Diagnosis present

## 2019-08-24 DIAGNOSIS — Z20822 Contact with and (suspected) exposure to covid-19: Secondary | ICD-10-CM | POA: Diagnosis present

## 2019-08-24 DIAGNOSIS — K5900 Constipation, unspecified: Secondary | ICD-10-CM | POA: Diagnosis present

## 2019-08-24 DIAGNOSIS — S72011A Unspecified intracapsular fracture of right femur, initial encounter for closed fracture: Principal | ICD-10-CM | POA: Diagnosis present

## 2019-08-24 DIAGNOSIS — I5022 Chronic systolic (congestive) heart failure: Secondary | ICD-10-CM | POA: Diagnosis present

## 2019-08-24 DIAGNOSIS — E1165 Type 2 diabetes mellitus with hyperglycemia: Secondary | ICD-10-CM | POA: Diagnosis not present

## 2019-08-24 DIAGNOSIS — Z95810 Presence of automatic (implantable) cardiac defibrillator: Secondary | ICD-10-CM | POA: Diagnosis present

## 2019-08-24 DIAGNOSIS — Z96649 Presence of unspecified artificial hip joint: Secondary | ICD-10-CM

## 2019-08-24 DIAGNOSIS — Z66 Do not resuscitate: Secondary | ICD-10-CM | POA: Diagnosis present

## 2019-08-24 DIAGNOSIS — Z419 Encounter for procedure for purposes other than remedying health state, unspecified: Secondary | ICD-10-CM

## 2019-08-24 LAB — CBC WITH DIFFERENTIAL/PLATELET
Abs Immature Granulocytes: 0.04 10*3/uL (ref 0.00–0.07)
Basophils Absolute: 0 10*3/uL (ref 0.0–0.1)
Basophils Relative: 0 %
Eosinophils Absolute: 0.1 10*3/uL (ref 0.0–0.5)
Eosinophils Relative: 1 %
HCT: 48 % — ABNORMAL HIGH (ref 36.0–46.0)
Hemoglobin: 15.3 g/dL — ABNORMAL HIGH (ref 12.0–15.0)
Immature Granulocytes: 0 %
Lymphocytes Relative: 11 %
Lymphs Abs: 1 10*3/uL (ref 0.7–4.0)
MCH: 29 pg (ref 26.0–34.0)
MCHC: 31.9 g/dL (ref 30.0–36.0)
MCV: 90.9 fL (ref 80.0–100.0)
Monocytes Absolute: 0.4 10*3/uL (ref 0.1–1.0)
Monocytes Relative: 5 %
Neutro Abs: 7.5 10*3/uL (ref 1.7–7.7)
Neutrophils Relative %: 83 %
Platelets: 126 10*3/uL — ABNORMAL LOW (ref 150–400)
RBC: 5.28 MIL/uL — ABNORMAL HIGH (ref 3.87–5.11)
RDW: 13.1 % (ref 11.5–15.5)
WBC: 9.1 10*3/uL (ref 4.0–10.5)
nRBC: 0 % (ref 0.0–0.2)

## 2019-08-24 LAB — HEMOGLOBIN A1C
Hgb A1c MFr Bld: 7.6 % — ABNORMAL HIGH (ref 4.8–5.6)
Mean Plasma Glucose: 171.42 mg/dL

## 2019-08-24 LAB — BASIC METABOLIC PANEL
Anion gap: 10 (ref 5–15)
BUN: 34 mg/dL — ABNORMAL HIGH (ref 8–23)
CO2: 28 mmol/L (ref 22–32)
Calcium: 9.9 mg/dL (ref 8.9–10.3)
Chloride: 102 mmol/L (ref 98–111)
Creatinine, Ser: 1.72 mg/dL — ABNORMAL HIGH (ref 0.44–1.00)
GFR calc Af Amer: 31 mL/min — ABNORMAL LOW (ref >60)
GFR calc non Af Amer: 27 mL/min — ABNORMAL LOW (ref >60)
Glucose, Bld: 170 mg/dL — ABNORMAL HIGH (ref 70–99)
Potassium: 4.5 mmol/L (ref 3.5–5.1)
Sodium: 140 mmol/L (ref 135–145)

## 2019-08-24 LAB — GLUCOSE, CAPILLARY
Glucose-Capillary: 125 mg/dL — ABNORMAL HIGH (ref 70–99)
Glucose-Capillary: 116 mg/dL — ABNORMAL HIGH (ref 70–99)

## 2019-08-24 LAB — SARS CORONAVIRUS 2 BY RT PCR (HOSPITAL ORDER, PERFORMED IN ~~LOC~~ HOSPITAL LAB): SARS Coronavirus 2: NEGATIVE

## 2019-08-24 MED ORDER — CARVEDILOL 12.5 MG PO TABS
12.5000 mg | ORAL_TABLET | Freq: Two times a day (BID) | ORAL | Status: DC
  Administered 2019-08-24 – 2019-08-27 (×5): 12.5 mg via ORAL
  Filled 2019-08-24 (×5): qty 1

## 2019-08-24 MED ORDER — METHOCARBAMOL 1000 MG/10ML IJ SOLN
500.0000 mg | Freq: Four times a day (QID) | INTRAVENOUS | Status: DC | PRN
  Filled 2019-08-24: qty 5

## 2019-08-24 MED ORDER — HYDROCODONE-ACETAMINOPHEN 5-325 MG PO TABS
1.0000 | ORAL_TABLET | Freq: Four times a day (QID) | ORAL | Status: DC | PRN
  Administered 2019-08-24: 2 via ORAL
  Filled 2019-08-24: qty 2

## 2019-08-24 MED ORDER — EZETIMIBE 10 MG PO TABS
5.0000 mg | ORAL_TABLET | Freq: Every day | ORAL | Status: DC
  Administered 2019-08-26 – 2019-08-27 (×2): 5 mg via ORAL
  Filled 2019-08-24 (×2): qty 1

## 2019-08-24 MED ORDER — CHLORHEXIDINE GLUCONATE 4 % EX LIQD: 60.0000 mL | Freq: Once | CUTANEOUS | Status: DC

## 2019-08-24 MED ORDER — ATORVASTATIN CALCIUM 20 MG PO TABS
20.0000 mg | ORAL_TABLET | Freq: Every day | ORAL | Status: DC
  Administered 2019-08-26 – 2019-08-27 (×2): 20 mg via ORAL
  Filled 2019-08-24 (×2): qty 1

## 2019-08-24 MED ORDER — MORPHINE SULFATE (PF) 4 MG/ML IV SOLN
4.0000 mg | Freq: Once | INTRAVENOUS | Status: AC
  Administered 2019-08-24: 4 mg via INTRAVENOUS
  Filled 2019-08-24: qty 1

## 2019-08-24 MED ORDER — POLYETHYLENE GLYCOL 3350 17 G PO PACK
17.0000 g | PACK | Freq: Every day | ORAL | Status: DC | PRN
  Filled 2019-08-24: qty 1

## 2019-08-24 MED ORDER — MORPHINE SULFATE (PF) 2 MG/ML IV SOLN: 0.5000 mg | INTRAVENOUS | Status: DC | PRN

## 2019-08-24 MED ORDER — INSULIN GLARGINE 100 UNIT/ML ~~LOC~~ SOLN
15.0000 [IU] | Freq: Every day | SUBCUTANEOUS | Status: DC
  Administered 2019-08-24 – 2019-08-26 (×3): 15 [IU] via SUBCUTANEOUS
  Filled 2019-08-24 (×4): qty 0.15

## 2019-08-24 MED ORDER — AMIODARONE HCL 100 MG PO TABS
100.0000 mg | ORAL_TABLET | Freq: Every day | ORAL | Status: DC
  Administered 2019-08-25 – 2019-08-27 (×3): 100 mg via ORAL
  Filled 2019-08-24 (×3): qty 1

## 2019-08-24 MED ORDER — INSULIN ASPART 100 UNIT/ML ~~LOC~~ SOLN
0.0000 [IU] | SUBCUTANEOUS | Status: DC
  Administered 2019-08-25: 3 [IU] via SUBCUTANEOUS
  Administered 2019-08-26 (×2): 5 [IU] via SUBCUTANEOUS
  Administered 2019-08-26: 2 [IU] via SUBCUTANEOUS

## 2019-08-24 MED ORDER — HEPARIN SODIUM (PORCINE) 5000 UNIT/ML IJ SOLN: 5000.0000 [IU] | Freq: Three times a day (TID) | INTRAMUSCULAR | Status: DC

## 2019-08-24 MED ORDER — METHOCARBAMOL 500 MG PO TABS
500.0000 mg | ORAL_TABLET | Freq: Four times a day (QID) | ORAL | Status: DC | PRN
  Administered 2019-08-24 – 2019-08-25 (×2): 500 mg via ORAL
  Filled 2019-08-24 (×2): qty 1

## 2019-08-24 NOTE — ED Notes (Signed)
Attempted to obtain IV- Unsuccessful times 3

## 2019-08-24 NOTE — ED Provider Notes (Signed)
[facility] DEPT Provider Note   CSN: [id number] Arrival date & time: 08/24/19  1134     History Chief Complaint  Patient presents with  . Fall  . Hip Pain    Janet Owen is a 86 y.o. female.  86 year old female with prior medical history as detailed below presents for evaluation of right hip pain after fall.  Patient reports that she was at home in her kitchen.  She had a mechanical fall where her foot slipped on the floor.  She landed hard on the right hip.  She is unable to ambulate after the fall.  She denies head injury or loss conscious.  She denies neck pain.  The history is provided by the patient.  Fall This is a new problem. The current episode started 1 to 2 hours ago. The problem occurs constantly. The problem has not changed since onset.Pertinent negatives include no chest pain. Nothing aggravates the symptoms. Nothing relieves the symptoms. She has tried nothing for the symptoms.       Past Medical History:  Diagnosis Date  . Atrial fibrillation ([place])   . DM (diabetes mellitus) ([place])   . Heart failure ([place])   . Hypertension     Patient Active Problem List   Diagnosis Date Noted  . ICD BV  09/15/2015:  St. Jude Medical [place] model [id number] (serial  Number [id number]) in situ 07/23/2018  . Encounter for management of biventricular implantable cardioverter-defibrillator (ICD) 07/23/2018  . Type 2 diabetes mellitus with hyperglycemia ([place]) 08/31/2016  . CKD (chronic kidney disease) stage 3, GFR 30-59 ml/min 08/14/2016  . Mixed dyslipidemia 08/14/2016  . Vitamin D insufficiency 08/14/2016  . Typical atrial flutter ([place]) 07/30/2016  . Atrial flutter ([place]) 07/30/2016  . Chronic systolic heart failure ([place]) 11/11/2015  . CHF (congestive heart failure) ([place]) 09/15/2015  . Chronic systolic congestive heart failure ([place])   . Dyspnea 08/20/2014  . Abnormal CT scan, chest 08/20/2014  . Essential (primary) hypertension  08/20/2014  . Abnormal computed tomography scan 08/20/2014  . Encounter to establish care 08/09/2014  . Type 2 diabetes mellitus without complication ([place]) 44/04/4740    Past Surgical History:  Procedure Laterality Date  . A-FLUTTER ABLATION N/A 07/30/2016   Procedure: A-Flutter Ablation;  Surgeon: Evans Lance, MD;  Location: [facility];  Service: Cardiovascular;  Laterality: N/A;  . APPENDECTOMY    . BACK SURGERY    . EP IMPLANTABLE DEVICE N/A 11/11/2015   Procedure: BiVi Upgrade;  Surgeon: Evans Lance, MD;  Location: [facility];  Service: Cardiovascular;  Laterality: N/A;  . EP IMPLANTABLE DEVICE N/A 09/15/2015   Procedure: BiV ICD Insertion CRT-D;  Surgeon: Will Meredith Leeds, MD;  Location: [facility];  Service: Cardiovascular;  Laterality: N/A;  . PARTIAL HYSTERECTOMY    . REPLACEMENT TOTAL KNEE    . VESICOVAGINAL FISTULA CLOSURE W/ TAH       OB History   No obstetric history on file.     Family History  Problem Relation Age of Onset  . Asthma Mother   . Pancreatitis Mother   . Alcohol abuse Father     Social History   Tobacco Use  . Smoking status: Never Smoker  . Smokeless tobacco: Never Used  Vaping Use  . Vaping Use: Never used  Substance Use Topics  . Alcohol use: No    Alcohol/week: 0.0 standard drinks  . Drug use: No    Home Medications Prior to  Admission medications   Medication Sig Start Date End Date Taking? Authorizing Provider  amiodarone (PACERONE) 200 MG tablet TAKE 1/2 TABLET BY MOUTH EVERY DAY Patient taking differently: Take 100 mg by mouth daily.  03/02/19  Yes Adrian Prows, MD  atorvastatin (LIPITOR) 20 MG tablet Take 20 mg by mouth daily.   Yes [provider]  carvedilol (COREG) 12.5 MG tablet TAKE 1 TABLET BY MOUTH TWICE DAILY Patient taking differently: Take 12.5 mg by mouth 2 (two) times daily with a meal.  07/06/19  Yes Adrian Prows, MD  diphenhydramine-acetaminophen (TYLENOL PM) 25-500 MG TABS tablet  Take 2 tablets by mouth at bedtime.   Yes [provider]  ezetimibe (ZETIA) 10 MG tablet Take one-half tablet (5 mg) by mouth daily   Yes [provider]  furosemide (LASIX) 40 MG tablet TAKE 1 TABLET BY MOUTH DAILY Patient taking differently: Take 40 mg by mouth daily.  05/20/19  Yes Adrian Prows, MD  insulin glargine, 1 Unit Dial, (TOUJEO SOLOSTAR) 300 UNIT/ML Solostar Pen Inject 30 Units into the skin at bedtime.  10/23/18  Yes [provider]  JANUVIA 50 MG tablet Take 50 mg by mouth daily.  08/17/16  Yes [provider]  nateglinide (STARLIX) 120 MG tablet Take 120 mg by mouth 3 (three) times daily before meals. For diabetes  09/28/16  Yes [provider]  Vitamin D, Cholecalciferol, 25 MCG (1000 UT) TABS Take 1,000 Units by mouth daily.   Yes [provider]  glucose blood (ONETOUCH VERIO) test strip USE TO TEST BLOOD SUGAR TWICE DAILY 03/23/19   [provider]  Insulin Pen Needle (B-D UF III MINI PEN NEEDLES) 31G X 5 MM MISC Use to inject Toujeo once daily. 01/30/19   [provider]    Allergies    Exenatide and Raloxifene  Review of Systems   Review of Systems  Cardiovascular: Negative for chest pain.  All other systems reviewed and are negative.   Physical Exam Updated Vital Signs BP (!) 154/67 (BP Location: Left Arm)   Pulse 60   Temp 98.5 F (36.9 C) (Oral)   Resp 20   SpO2 96%   Physical Exam Vitals and nursing note reviewed.  Constitutional:      General: She is not in acute distress.    Appearance: She is well-developed.  HENT:     Head: Normocephalic and atraumatic.  Eyes:     Conjunctiva/sclera: Conjunctivae normal.     Pupils: Pupils are equal, round, and reactive to light.  Cardiovascular:     Rate and Rhythm: Normal rate and regular rhythm.     Heart sounds: Normal heart sounds.  Pulmonary:     Effort: Pulmonary effort is normal. No respiratory distress.     Breath sounds: Normal breath  sounds.  Abdominal:     General: There is no distension.     Palpations: Abdomen is soft.     Tenderness: There is no abdominal tenderness.  Musculoskeletal:        General: Tenderness present. No deformity. Normal range of motion.     Cervical back: Normal range of motion and neck supple.     Comments: Moderate tenderness to the lateral aspect of the right hip.  Right Lower extremity is shortened and externally rotated.  Skin:    General: Skin is warm and dry.  Neurological:     Mental Status: She is alert and oriented to person, place, and time.     ED Results /  Procedures / Treatments   Labs (all labs ordered are listed, but only abnormal results are displayed) Labs Reviewed  BASIC METABOLIC PANEL - Abnormal; Notable for the following components:      Result Value   Glucose, Bld 170 (*)    BUN 34 (*)    Creatinine, Ser 1.72 (*)    GFR calc non Af Amer 27 (*)    GFR calc Af Amer 31 (*)    All other components within normal limits  CBC WITH DIFFERENTIAL/PLATELET - Abnormal; Notable for the following components:   RBC 5.28 (*)    Hemoglobin 15.3 (*)    HCT 48.0 (*)    Platelets 126 (*)    All other components within normal limits    EKG None  Radiology DG Chest 1 View  Result Date: 08/24/2019 CLINICAL DATA:  Status post fall today.  Right hip fracture. EXAM: CHEST  1 VIEW COMPARISON:  PA and lateral chest 11/12/2015. FINDINGS: Pacing device is unchanged. Heart size is upper normal. Lungs clear. No pneumothorax or pleural fluid. No acute or focal bony abnormality. IMPRESSION: No acute disease. Electronically Signed   By: Inge Rise M.D.   On: 08/24/2019 13:25   DG Hip Unilat W or Wo Pelvis 2-3 Views Right  Result Date: 08/24/2019 CLINICAL DATA:  Right hip pain after a fall today. Initial encounter. EXAM: DG HIP (WITH OR WITHOUT PELVIS) 2-3V RIGHT COMPARISON:  None. FINDINGS: The patient has an acute subcapital fracture of the right hip. The femoral head is  located. No other acute abnormality is identified. Lower lumbar degenerative change noted. IMPRESSION: Acute subcapital fracture right hip. Electronically Signed   By: Inge Rise M.D.   On: 08/24/2019 13:24    Procedures Procedures (including critical care time)  Medications Ordered in ED Medications  morphine 4 MG/ML injection 4 mg (4 mg Intravenous Given 08/24/19 1244)    ED Course  I have reviewed the triage vital signs and the nursing notes.  Pertinent labs & imaging results that were available during my care of the patient were reviewed by me and considered in my medical decision making (see chart for details).    MDM Rules/Calculators/A&P                          MDM  Screen complete  CAITLIN AINLEY was evaluated in Emergency Department on 08/24/2019 for the symptoms described in the history of present illness. She was evaluated in the context of the global COVID-19 pandemic, which necessitated consideration that the patient might be at risk for infection with the SARS-CoV-2 virus that causes COVID-19. Institutional protocols and algorithms that pertain to the evaluation of patients at risk for COVID-19 are in a state of rapid change based on information released by regulatory bodies including the CDC and federal and state organizations. These policies and algorithms were followed during the patient's care in the ED.  Patient is presenting for evaluation of of right hip pain following fall.  Work-up reveals evidence of right hip fracture.  Hospitalist service is aware of case and will evaluate for admission.  Orthopedics - Dr. Alvan Dame - paged (x2) regarding case. No call back (even after contacting Olin's office directly).  Patient and patient's husband at bedside understand plan of care.    Final Clinical Impression(s) / ED Diagnoses Final diagnoses:  Closed fracture of right hip, initial encounter [facility])    Rx / DC Orders ED Discharge Orders  None         Valarie Merino, MD 08/24/19 1513

## 2019-08-24 NOTE — Progress Notes (Signed)
Received report from ED RN.

## 2019-08-24 NOTE — ED Notes (Signed)
Attempted to call report. No answer.

## 2019-08-24 NOTE — Consult Note (Addendum)
Reason for Consult: Right hip fracture Referring Physician: ED Provider  Janet Owen is an 86 y.o. female.  HPI: Janet Owen is a 86 y.o. female with medical history significant of nonischemic cardiomyopathy ejection fraction 35%, status post biventricular ICD, atrial fibrillation status post ablation, transient atrial fibrillation, hypertension, hyperlipidemia, diabetes, who was in her usual state of health when she was in her pantry this morning.  She lost her footing and fell onto her right hip.  Denies any loss of consciousness, chest pain, shortness of breath, dizziness prior to fall.  She was unable to stand after the fall.  Her husband called EMS and she was brought to the hospital for evaluation.  Work-up in the emergency room revealed right-sided hip fracture.  She has been referred for admission. Ortho consulted.  Past Medical History:  Diagnosis Date   Atrial fibrillation ([place])    DM (diabetes mellitus) ([place])    Heart failure ([place])    Hypertension     Past Surgical History:  Procedure Laterality Date   A-FLUTTER ABLATION N/A 07/30/2016   Procedure: A-Flutter Ablation;  Surgeon: Evans Lance, MD;  Location: [place] [facility];  Service: Cardiovascular;  Laterality: N/A;   APPENDECTOMY     BACK SURGERY     EP IMPLANTABLE DEVICE N/A 11/11/2015   Procedure: BiVi Upgrade;  Surgeon: Evans Lance, MD;  Location: [place] [facility];  Service: Cardiovascular;  Laterality: N/A;   EP IMPLANTABLE DEVICE N/A 09/15/2015   Procedure: BiV ICD Insertion CRT-D;  Surgeon: Will Meredith Leeds, MD;  Location: [place] [facility];  Service: Cardiovascular;  Laterality: N/A;   PARTIAL HYSTERECTOMY     REPLACEMENT TOTAL KNEE     VESICOVAGINAL FISTULA CLOSURE W/ TAH      Family History  Problem Relation Age of Onset   Asthma Mother    Pancreatitis Mother    Alcohol abuse Father     Social History:  reports that she has never smoked. She has never used smokeless  tobacco. She reports that she does not drink alcohol and does not use drugs.  Allergies:  Allergies  Allergen Reactions   Exenatide Nausea Only    Dizziness   Raloxifene Other (See Comments)    hot flashes    Medications: I have reviewed the patient's current medications.  Results for orders placed or performed during the hospital encounter of 08/24/19 (from the past 48 hour(s))  Basic metabolic panel     Status: Abnormal   Collection Time: 08/24/19 12:41 PM  Result Value Ref Range   Sodium 140 135 - 145 mmol/L   Potassium 4.5 3.5 - 5.1 mmol/L   Chloride 102 98 - 111 mmol/L   CO2 28 22 - 32 mmol/L   Glucose, Bld 170 (H) 70 - 99 mg/dL    Comment: Glucose reference range applies only to samples taken after fasting for at least 8 hours.   BUN 34 (H) 8 - 23 mg/dL   Creatinine, Ser 1.72 (H) 0.44 - 1.00 mg/dL   Calcium 9.9 8.9 - 10.3 mg/dL   GFR calc non Af Amer 27 (L) >60 mL/min   GFR calc Af Amer 31 (L) >60 mL/min   Anion gap 10 5 - 15    Comment: Performed at [facility], [place] [address], [place], [place] [address]  CBC with Differential     Status: Abnormal   Collection Time: 08/24/19 12:41 PM  Result Value Ref Range   WBC 9.1 4.0 -  10.5 K/uL   RBC 5.28 (H) 3.87 - 5.11 MIL/uL   Hemoglobin 15.3 (H) 12.0 - 15.0 g/dL   HCT 48.0 (H) 36 - 46 %   MCV 90.9 80.0 - 100.0 fL   MCH 29.0 26.0 - 34.0 pg   MCHC 31.9 30.0 - 36.0 g/dL   RDW 13.1 11.5 - 15.5 %   Platelets 126 (L) 150 - 400 K/uL   nRBC 0.0 0.0 - 0.2 %   Neutrophils Relative % 83 %   Neutro Abs 7.5 1.7 - 7.7 K/uL   Lymphocytes Relative 11 %   Lymphs Abs 1.0 0.7 - 4.0 K/uL   Monocytes Relative 5 %   Monocytes Absolute 0.4 0 - 1 K/uL   Eosinophils Relative 1 %   Eosinophils Absolute 0.1 0 - 0 K/uL   Basophils Relative 0 %   Basophils Absolute 0.0 0 - 0 K/uL   Immature Granulocytes 0 %   Abs Immature Granulocytes 0.04 0.00 - 0.07 K/uL    Comment: Performed at [facility] [place]  [address] [place], [place] [address]  SARS Coronavirus 2 by RT PCR (hospital order, performed in [facility] hospital lab) Nasopharyngeal Nasopharyngeal Swab     Status: None   Collection Time: 08/24/19  2:04 PM   Specimen: Nasopharyngeal Swab  Result Value Ref Range   SARS Coronavirus 2 NEGATIVE NEGATIVE    Comment: (NOTE) SARS-CoV-2 target nucleic acids are NOT DETECTED.  The SARS-CoV-2 RNA is generally detectable in upper and lower respiratory specimens during the acute phase of infection. The lowest concentration of SARS-CoV-2 viral copies this assay can detect is 250 copies / mL. A negative result does not preclude SARS-CoV-2 infection and should not be used as the sole basis for treatment or other patient management decisions.  A negative result may occur with improper specimen collection / handling, submission of specimen other than nasopharyngeal swab, presence of viral mutation(s) within the areas targeted by this assay, and inadequate number of viral copies (<250 copies / mL). A negative result must be combined with clinical observations, patient history, and epidemiological information.  Fact Sheet for Patients:   StrictlyIdeas.no  Fact Sheet for Healthcare Providers: BankingDealers.co.za  This test is not yet approved or  cleared by the [place] FDA and has been authorized for detection and/or diagnosis of SARS-CoV-2 by FDA under an Emergency Use Authorization (EUA).  This EUA will remain in effect (meaning this test can be used) for the duration of the COVID-19 declaration under Section 564(b)(1) of the Act, 21 U.S.C. section 360bbb-3(b)(1), unless the authorization is terminated or revoked sooner.  Performed at [facility], [place] [address] [place], [place] [address]   Glucose, capillary     Status: Abnormal   Collection Time: 08/24/19  5:10 PM  Result Value Ref Range   Glucose-Capillary 125  (H) 70 - 99 mg/dL    Comment: Glucose reference range applies only to samples taken after fasting for at least 8 hours.    DG Chest 1 View  Result Date: 08/24/2019 CLINICAL DATA:  Status post fall today.  Right hip fracture. EXAM: CHEST  1 VIEW COMPARISON:  PA and lateral chest 11/12/2015. FINDINGS: Pacing device is unchanged. Heart size is upper normal. Lungs clear. No pneumothorax or pleural fluid. No acute or focal bony abnormality. IMPRESSION: No acute disease. Electronically Signed   By: Inge Rise M.D.   On: 08/24/2019 13:25   DG Hip Unilat W or Wo Pelvis 2-3 Views  Right  Result Date: 08/24/2019 CLINICAL DATA:  Right hip pain after a fall today. Initial encounter. EXAM: DG HIP (WITH OR WITHOUT PELVIS) 2-3V RIGHT COMPARISON:  None. FINDINGS: The patient has an acute subcapital fracture of the right hip. The femoral head is located. No other acute abnormality is identified. Lower lumbar degenerative change noted. IMPRESSION: Acute subcapital fracture right hip. Electronically Signed   By: Inge Rise M.D.   On: 08/24/2019 13:24     Review of Systems  Constitutional: Negative.   HENT: Negative.   Eyes: Negative.   Respiratory: Negative.   Cardiovascular: Negative.   Gastrointestinal: Negative.   Genitourinary: Negative.   Musculoskeletal: Positive for joint pain.  Skin: Negative.   Neurological: Negative.   Endo/Heme/Allergies: Negative.   Psychiatric/Behavioral: Negative.    Blood pressure (!) 158/76, pulse (!) 59, temperature 98.4 F (36.9 C), resp. rate 18, height 5\' 7"  (1.702 m), weight 84.1 kg, SpO2 100 %. Physical Exam Constitutional:      Appearance: She is well-developed.  HENT:     Head: Normocephalic.  Eyes:     Pupils: Pupils are equal, round, and reactive to light.  Neck:     Thyroid: No thyromegaly.     Vascular: No JVD.     Trachea: No tracheal deviation.  Cardiovascular:     Rate and Rhythm: Normal rate and regular rhythm.  Pulmonary:      Effort: Pulmonary effort is normal. No respiratory distress.     Breath sounds: Normal breath sounds. No wheezing.  Abdominal:     Palpations: Abdomen is soft.     Tenderness: There is no abdominal tenderness. There is no guarding.  Musculoskeletal:     Cervical back: Neck supple.     Right hip: Tenderness and bony tenderness present. Decreased range of motion. Decreased strength.  Lymphadenopathy:     Cervical: No cervical adenopathy.  Skin:    General: Skin is warm and dry.  Neurological:     Mental Status: She is alert and oriented to person, place, and time.     Assessment/Plan:  Right hip fracture  Dr. Alvan Dame plans on adding her on to the schedule tomorrow  Plan on a THA  Urgent order set placed  Regular diet order placed and will be NPO after midnight  Risks, benefits and expectations were discussed with the patient.  Risks including but not limited to the risk of anesthesia, blood clots, nerve damage, blood vessel damage, failure of the prosthesis, infection and up to and including death.  Patient understand the risks, benefits and expectations and wishes to proceed with surgery.      Lucille Passy [facility] 08/24/2019, 5:42 PM

## 2019-08-24 NOTE — ED Triage Notes (Signed)
Pt arrives [facility] from home with reports of mechanical fall. Pt reports right hip and leg pain upon movement. EMS reports external rotation to right leg. EMS reports +PMS. Pt denies LOC, Denies head injury, Denies blood thinners.

## 2019-08-24 NOTE — H&P (Addendum)
History and Physical    Janet Owen CNO:[id number] DOB: 03/12/1933 DOA: 08/24/2019  PCP: Bartholome Bill, MD  Patient coming from: home  I have personally briefly reviewed patient's old medical records in [place]  Chief Complaint: fall, right hip pain  HPI: Janet Owen is a 86 y.o. female with medical history significant of nonischemic cardiomyopathy ejection fraction 35%, status post biventricular ICD, atrial fibrillation status post ablation, transient atrial fibrillation, hypertension, hyperlipidemia, diabetes, who was in her usual state of health when she was in her pantry this morning.  She lost her footing and fell onto her right hip.  Denies any loss of consciousness, chest pain, shortness of breath, dizziness prior to fall.  She was unable to stand after the fall.  Her husband called EMS and she was brought to the hospital for evaluation.  Work-up in the emergency room revealed right-sided hip fracture.  She has been referred for admission.  Review of Systems: As per HPI otherwise 10 point review of systems negative.    Past Medical History:  Diagnosis Date  . Atrial fibrillation ([place])   . DM (diabetes mellitus) ([place])   . Heart failure ([place])   . Hypertension     Past Surgical History:  Procedure Laterality Date  . A-FLUTTER ABLATION N/A 07/30/2016   Procedure: A-Flutter Ablation;  Surgeon: Evans Lance, MD;  Location: [place] [facility];  Service: Cardiovascular;  Laterality: N/A;  . APPENDECTOMY    . BACK SURGERY    . EP IMPLANTABLE DEVICE N/A 11/11/2015   Procedure: BiVi Upgrade;  Surgeon: Evans Lance, MD;  Location: [place] [facility];  Service: Cardiovascular;  Laterality: N/A;  . EP IMPLANTABLE DEVICE N/A 09/15/2015   Procedure: BiV ICD Insertion CRT-D;  Surgeon: Will Meredith Leeds, MD;  Location: [place] [facility];  Service: Cardiovascular;  Laterality: N/A;  . PARTIAL HYSTERECTOMY    . REPLACEMENT TOTAL KNEE    . VESICOVAGINAL  FISTULA CLOSURE W/ TAH      Social History:  reports that she has never smoked. She has never used smokeless tobacco. She reports that she does not drink alcohol and does not use drugs.  Allergies  Allergen Reactions  . Exenatide Nausea Only    Dizziness  . Raloxifene Other (See Comments)    hot flashes    Family History  Problem Relation Age of Onset  . Asthma Mother   . Pancreatitis Mother   . Alcohol abuse Father     Prior to Admission medications   Medication Sig Start Date End Date Taking? Authorizing Provider  amiodarone (PACERONE) 200 MG tablet TAKE 1/2 TABLET BY MOUTH EVERY DAY Patient taking differently: Take 100 mg by mouth daily.  03/02/19  Yes Adrian Prows, MD  atorvastatin (LIPITOR) 20 MG tablet Take 20 mg by mouth daily.   Yes [provider]  carvedilol (COREG) 12.5 MG tablet TAKE 1 TABLET BY MOUTH TWICE DAILY Patient taking differently: Take 12.5 mg by mouth 2 (two) times daily with a meal.  07/06/19  Yes Adrian Prows, MD  diphenhydramine-acetaminophen (TYLENOL PM) 25-500 MG TABS tablet Take 2 tablets by mouth at bedtime.   Yes [provider]  ezetimibe (ZETIA) 10 MG tablet Take one-half tablet (5 mg) by mouth daily   Yes [provider]  furosemide (LASIX) 40 MG tablet TAKE 1 TABLET BY MOUTH DAILY Patient taking differently: Take 40 mg by mouth daily.  05/20/19  Yes Adrian Prows, MD  insulin glargine, 1  Unit Dial, (TOUJEO SOLOSTAR) 300 UNIT/ML Solostar Pen Inject 30 Units into the skin at bedtime.  10/23/18  Yes [provider]  JANUVIA 50 MG tablet Take 50 mg by mouth daily.  08/17/16  Yes [provider]  nateglinide (STARLIX) 120 MG tablet Take 120 mg by mouth 3 (three) times daily before meals. For diabetes  09/28/16  Yes [provider]  Vitamin D, Cholecalciferol, 25 MCG (1000 UT) TABS Take 1,000 Units by mouth daily.   Yes [provider]  glucose blood (ONETOUCH VERIO) test strip USE TO TEST BLOOD SUGAR  TWICE DAILY 03/23/19   [provider]  Insulin Pen Needle (B-D UF III MINI PEN NEEDLES) 31G X 5 MM MISC Use to inject Toujeo once daily. 01/30/19   [provider]    Physical Exam: Vitals:   08/24/19 1143 08/24/19 1145 08/24/19 1512  BP: (!) 154/67  (!) 163/82  Pulse: 60  65  Resp: 20  16  Temp: 98.5 F (36.9 C)  98.1 F (36.7 C)  TempSrc: Oral  Oral  SpO2: 90% 96% 98%  Weight:   84.1 kg  Height:   5\' 7"  (1.702 m)    Constitutional: NAD, calm, comfortable Eyes: PERRL, lids and conjunctivae normal ENMT: Mucous membranes are moist. Posterior pharynx clear of any exudate or lesions.Normal dentition.  Neck: normal, supple, no masses, no thyromegaly Respiratory: clear to auscultation bilaterally, no wheezing, no crackles. Normal respiratory effort. No accessory muscle use.  Cardiovascular: Regular rate and rhythm, no murmurs / rubs / gallops. 1+ extremity edema. 2+ pedal pulses. No carotid bruits.  Abdomen: no tenderness, no masses palpated. No hepatosplenomegaly. Bowel sounds positive.  Musculoskeletal: RLE is externally rotated  Skin: no rashes, lesions, ulcers. No induration Neurologic: CN 2-12 grossly intact. Sensation intact, DTR normal. Strength 5/5 in all 4.  Psychiatric: Normal judgment and insight. Alert and oriented x 3. Normal mood.    Labs on Admission: I have personally reviewed following labs and imaging studies  CBC: Recent Labs  Lab 08/24/19 1241  WBC 9.1  NEUTROABS 7.5  HGB 15.3*  HCT 48.0*  MCV 90.9  PLT 253*   Basic Metabolic Panel: Recent Labs  Lab 08/24/19 1241  NA 140  K 4.5  CL 102  CO2 28  GLUCOSE 170*  BUN 34*  CREATININE 1.72*  CALCIUM 9.9   GFR: Estimated Creatinine Clearance: 27.1 mL/min (A) (by C-G formula based on SCr of 1.72 mg/dL (H)). Liver Function Tests: No results for input(s): AST, ALT, ALKPHOS, BILITOT, PROT, ALBUMIN in the last 168 hours. No results for input(s): LIPASE, AMYLASE in the last 168  hours. No results for input(s): AMMONIA in the last 168 hours. Coagulation Profile: No results for input(s): INR, PROTIME in the last 168 hours. Cardiac Enzymes: No results for input(s): CKTOTAL, CKMB, CKMBINDEX, TROPONINI in the last 168 hours. BNP (last 3 results) No results for input(s): PROBNP in the last 8760 hours. HbA1C: No results for input(s): HGBA1C in the last 72 hours. CBG: No results for input(s): GLUCAP in the last 168 hours. Lipid Profile: No results for input(s): CHOL, HDL, LDLCALC, TRIG, CHOLHDL, LDLDIRECT in the last 72 hours. Thyroid Function Tests: No results for input(s): TSH, T4TOTAL, FREET4, T3FREE, THYROIDAB in the last 72 hours. Anemia Panel: No results for input(s): VITAMINB12, FOLATE, FERRITIN, TIBC, IRON, RETICCTPCT in the last 72 hours. Urine analysis: No results found for: COLORURINE, APPEARANCEUR, LABSPEC, PHURINE, GLUCOSEU, HGBUR, BILIRUBINUR, KETONESUR, PROTEINUR, UROBILINOGEN, NITRITE, LEUKOCYTESUR  Radiological Exams on Admission:  DG Chest 1 View  Result Date: 08/24/2019 CLINICAL DATA:  Status post fall today.  Right hip fracture. EXAM: CHEST  1 VIEW COMPARISON:  PA and lateral chest 11/12/2015. FINDINGS: Pacing device is unchanged. Heart size is upper normal. Lungs clear. No pneumothorax or pleural fluid. No acute or focal bony abnormality. IMPRESSION: No acute disease. Electronically Signed   By: Inge Rise M.D.   On: 08/24/2019 13:25   DG Hip Unilat W or Wo Pelvis 2-3 Views Right  Result Date: 08/24/2019 CLINICAL DATA:  Right hip pain after a fall today. Initial encounter. EXAM: DG HIP (WITH OR WITHOUT PELVIS) 2-3V RIGHT COMPARISON:  None. FINDINGS: The patient has an acute subcapital fracture of the right hip. The femoral head is located. No other acute abnormality is identified. Lower lumbar degenerative change noted. IMPRESSION: Acute subcapital fracture right hip. Electronically Signed   By: Inge Rise M.D.   On: 08/24/2019 13:24     EKG: ordered on admission  Assessment/Plan Principal Problem:   Hip fracture [facility]) Active Problems:   Essential (primary) hypertension   Chronic systolic congestive heart failure (HCC)   Typical atrial flutter (HCC)   CKD (chronic kidney disease) stage 3, GFR 30-59 ml/min   Mixed dyslipidemia   Type 2 diabetes mellitus with hyperglycemia ([place])   ICD BV  09/15/2015:  [employer] [place] model [phone] (serial  Number [id number]) in situ   PAF (paroxysmal atrial fibrillation) ([place])   DNR (do not resuscitate)     1. Right-sided hip fracture.  Orthopedics has been consulted.  Will need operative management.  We will keep n.p.o. for now until seen by orthopedics.  Continue pain management.  Physical therapy evaluation once cleared by Ortho. 2. Chronic systolic congestive heart failure.  Ejection fraction 35%.  Appears compensated at this time.  Hold Lasix for now until after surgery. 3. Atrial flutter status post ablation.  During ablation procedure she had transient atrial fibrillation for a few minutes.  She was previously on anticoagulation.  I reviewed her case with her primary cardiologist, Dr. Einar Gip who felt it was reasonable to not continue any further anticoagulation at this point, since her atrial fibrillation was very transient and she has maintained sinus rhythm since her flutter ablation.  She will be continued on amiodarone and Coreg. 4. Diabetes.  Continue on basal insulin at half dose.  Hold oral agents.  Start on sliding scale insulin. 5. Hyperlipidemia.  Continue statin 6. Hypertension.  Continue on Coreg 7. Chronic kidney disease stage III.  Creatinine is currently at baseline.  Continue to monitor. 8. Pre operative assessment. Patient has not had any recent chest pain, shortness of breath or any other concerning symptoms. With patient's advanced age, and comorbodities, per Lyndel Safe revised cardiac risk index, she has a 1.6% risk of myocardial infarction or cardiac  arrest, intraoperatively or up to 30 days post op. Her risk of morbidity/mortality without surgery is significant and she should proceed with surgery.  DVT prophylaxis: heparin  Code Status: DNR  Family Communication: discussed with husband at the bedside  Disposition Plan: pending physical therapy evaluation  Consults called: orthopedics, Dr. Alvan Dame  Admission status: inpatient, medsurg   Kathie Dike MD [facility]   If 7PM-7AM, please contact night-coverage [email or web address]   08/24/2019, 4:05 PM

## 2019-08-25 ENCOUNTER — Encounter (HOSPITAL_COMMUNITY): Payer: Self-pay | Admitting: Internal Medicine

## 2019-08-25 ENCOUNTER — Inpatient Hospital Stay (HOSPITAL_COMMUNITY): Payer: Medicare Other

## 2019-08-25 ENCOUNTER — Inpatient Hospital Stay (HOSPITAL_COMMUNITY): Payer: Medicare Other | Admitting: Registered Nurse

## 2019-08-25 ENCOUNTER — Encounter (HOSPITAL_COMMUNITY)
Admission: EM | Disposition: A | Discharge status: Skilled Nursing Facility | Payer: Self-pay | Source: Home / Self Care | Attending: Family Medicine

## 2019-08-25 DIAGNOSIS — Z95810 Presence of automatic (implantable) cardiac defibrillator: Secondary | ICD-10-CM

## 2019-08-25 DIAGNOSIS — N1832 Chronic kidney disease, stage 3b: Secondary | ICD-10-CM

## 2019-08-25 DIAGNOSIS — I10 Essential (primary) hypertension: Secondary | ICD-10-CM

## 2019-08-25 HISTORY — PX: TOTAL HIP ARTHROPLASTY: SHX124

## 2019-08-25 LAB — TYPE AND SCREEN
ABO/RH(D): A NEG
Antibody Screen: NEGATIVE

## 2019-08-25 LAB — BASIC METABOLIC PANEL
Anion gap: 11 (ref 5–15)
BUN: 32 mg/dL — ABNORMAL HIGH (ref 8–23)
CO2: 25 mmol/L (ref 22–32)
Calcium: 9.7 mg/dL (ref 8.9–10.3)
Chloride: 101 mmol/L (ref 98–111)
Creatinine, Ser: 1.72 mg/dL — ABNORMAL HIGH (ref 0.44–1.00)
GFR calc Af Amer: 31 mL/min — ABNORMAL LOW (ref >60)
GFR calc non Af Amer: 27 mL/min — ABNORMAL LOW (ref >60)
Glucose, Bld: 188 mg/dL — ABNORMAL HIGH (ref 70–99)
Potassium: 4.1 mmol/L (ref 3.5–5.1)
Sodium: 137 mmol/L (ref 135–145)

## 2019-08-25 LAB — GLUCOSE, CAPILLARY
Glucose-Capillary: 107 mg/dL — ABNORMAL HIGH (ref 70–99)
Glucose-Capillary: 110 mg/dL — ABNORMAL HIGH (ref 70–99)
Glucose-Capillary: 112 mg/dL — ABNORMAL HIGH (ref 70–99)
Glucose-Capillary: 112 mg/dL — ABNORMAL HIGH (ref 70–99)
Glucose-Capillary: 132 mg/dL — ABNORMAL HIGH (ref 70–99)
Glucose-Capillary: 173 mg/dL — ABNORMAL HIGH (ref 70–99)
Glucose-Capillary: 226 mg/dL — ABNORMAL HIGH (ref 70–99)

## 2019-08-25 LAB — CBC
HCT: 46 % (ref 36.0–46.0)
Hemoglobin: 14.7 g/dL (ref 12.0–15.0)
MCH: 28.9 pg (ref 26.0–34.0)
MCHC: 32 g/dL (ref 30.0–36.0)
MCV: 90.4 fL (ref 80.0–100.0)
Platelets: 125 10*3/uL — ABNORMAL LOW (ref 150–400)
RBC: 5.09 MIL/uL (ref 3.87–5.11)
RDW: 13.2 % (ref 11.5–15.5)
WBC: 10.6 10*3/uL — ABNORMAL HIGH (ref 4.0–10.5)
nRBC: 0 % (ref 0.0–0.2)

## 2019-08-25 LAB — SURGICAL PCR SCREEN
MRSA, PCR: NEGATIVE
Staphylococcus aureus: NEGATIVE

## 2019-08-25 SURGERY — ARTHROPLASTY, HIP, TOTAL, ANTERIOR APPROACH
Anesthesia: Spinal | Site: Hip | Laterality: Right

## 2019-08-25 MED ORDER — FENTANYL CITRATE (PF) 100 MCG/2ML IJ SOLN
INTRAMUSCULAR | Status: AC
  Filled 2019-08-25: qty 2

## 2019-08-25 MED ORDER — BISACODYL 10 MG RE SUPP: 10.0000 mg | Freq: Every day | RECTAL | Status: DC | PRN

## 2019-08-25 MED ORDER — CEFAZOLIN SODIUM-DEXTROSE 2-4 GM/100ML-% IV SOLN
2.0000 g | Freq: Once | INTRAVENOUS | Status: AC
  Administered 2019-08-25: 2 g via INTRAVENOUS
  Filled 2019-08-25: qty 100

## 2019-08-25 MED ORDER — MENTHOL 3 MG MT LOZG: 1.0000 | LOZENGE | OROMUCOSAL | Status: DC | PRN

## 2019-08-25 MED ORDER — FENTANYL CITRATE (PF) 100 MCG/2ML IJ SOLN: 50.0000 ug | INTRAMUSCULAR | Status: DC

## 2019-08-25 MED ORDER — METOCLOPRAMIDE HCL 5 MG PO TABS: 5.0000 mg | ORAL_TABLET | Freq: Three times a day (TID) | ORAL | Status: DC | PRN

## 2019-08-25 MED ORDER — PHENYLEPHRINE HCL-NACL 10-0.9 MG/250ML-% IV SOLN
INTRAVENOUS | Status: DC | PRN
  Administered 2019-08-25: 50 ug/min via INTRAVENOUS
  Administered 2019-08-25: 25 ug/min via INTRAVENOUS

## 2019-08-25 MED ORDER — LACTATED RINGERS IV SOLN: INTRAVENOUS | Status: DC

## 2019-08-25 MED ORDER — DEXAMETHASONE SODIUM PHOSPHATE 10 MG/ML IJ SOLN
INTRAMUSCULAR | Status: DC | PRN
  Administered 2019-08-25: 5 mg via INTRAVENOUS

## 2019-08-25 MED ORDER — DOCUSATE SODIUM 100 MG PO CAPS
100.0000 mg | ORAL_CAPSULE | Freq: Two times a day (BID) | ORAL | Status: DC
  Administered 2019-08-25 – 2019-08-27 (×4): 100 mg via ORAL
  Filled 2019-08-25 (×4): qty 1

## 2019-08-25 MED ORDER — FERROUS SULFATE 325 (65 FE) MG PO TABS
325.0000 mg | ORAL_TABLET | Freq: Three times a day (TID) | ORAL | Status: DC
  Administered 2019-08-26 – 2019-08-27 (×4): 325 mg via ORAL
  Filled 2019-08-25 (×4): qty 1

## 2019-08-25 MED ORDER — PROPOFOL 10 MG/ML IV BOLUS
INTRAVENOUS | Status: DC | PRN
  Administered 2019-08-25: 5 mg via INTRAVENOUS

## 2019-08-25 MED ORDER — PROPOFOL 10 MG/ML IV BOLUS
INTRAVENOUS | Status: AC
  Filled 2019-08-25: qty 20

## 2019-08-25 MED ORDER — HYDROMORPHONE HCL 1 MG/ML IJ SOLN: 0.5000 mg | INTRAMUSCULAR | Status: DC | PRN

## 2019-08-25 MED ORDER — 0.9 % SODIUM CHLORIDE (POUR BTL) OPTIME
TOPICAL | Status: DC | PRN
  Administered 2019-08-25: 1000 mL

## 2019-08-25 MED ORDER — ONDANSETRON HCL 4 MG/2ML IJ SOLN
4.0000 mg | Freq: Four times a day (QID) | INTRAMUSCULAR | Status: DC | PRN
  Administered 2019-08-26: 4 mg via INTRAVENOUS
  Filled 2019-08-25: qty 2

## 2019-08-25 MED ORDER — MUPIROCIN 2 % EX OINT
1.0000 "application " | TOPICAL_OINTMENT | Freq: Two times a day (BID) | CUTANEOUS | Status: DC
  Administered 2019-08-25 – 2019-08-26 (×3): 1 via NASAL
  Filled 2019-08-25: qty 22

## 2019-08-25 MED ORDER — CELECOXIB 200 MG PO CAPS
200.0000 mg | ORAL_CAPSULE | Freq: Two times a day (BID) | ORAL | Status: DC
  Administered 2019-08-25 – 2019-08-27 (×4): 200 mg via ORAL
  Filled 2019-08-25 (×4): qty 1

## 2019-08-25 MED ORDER — HYDROCODONE-ACETAMINOPHEN 7.5-325 MG PO TABS
1.0000 | ORAL_TABLET | ORAL | Status: DC | PRN
  Administered 2019-08-27: 2 via ORAL
  Filled 2019-08-25: qty 2

## 2019-08-25 MED ORDER — HYDROCODONE-ACETAMINOPHEN 5-325 MG PO TABS
1.0000 | ORAL_TABLET | ORAL | Status: DC | PRN
  Administered 2019-08-25 – 2019-08-26 (×2): 2 via ORAL
  Administered 2019-08-26 – 2019-08-27 (×2): 1 via ORAL
  Filled 2019-08-25: qty 1
  Filled 2019-08-25 (×3): qty 2

## 2019-08-25 MED ORDER — BUPIVACAINE IN DEXTROSE 0.75-8.25 % IT SOLN
INTRATHECAL | Status: DC | PRN
  Administered 2019-08-25: 1.4 mL via INTRATHECAL

## 2019-08-25 MED ORDER — ENSURE SURGERY PO LIQD
237.0000 mL | ORAL | Status: DC
  Administered 2019-08-26 – 2019-08-27 (×2): 237 mL via ORAL
  Filled 2019-08-25 (×2): qty 237

## 2019-08-25 MED ORDER — BUPIVACAINE HCL (PF) 0.5 % IJ SOLN
INTRAMUSCULAR | Status: DC | PRN
Start: 2019-08-25 — End: 2019-08-25
  Administered 2019-08-25 (×6): 5 mL via PERINEURAL

## 2019-08-25 MED ORDER — DEXAMETHASONE SODIUM PHOSPHATE 10 MG/ML IJ SOLN
INTRAMUSCULAR | Status: AC
  Filled 2019-08-25: qty 1

## 2019-08-25 MED ORDER — ADULT MULTIVITAMIN W/MINERALS CH
1.0000 | ORAL_TABLET | Freq: Every day | ORAL | Status: DC
  Administered 2019-08-26 – 2019-08-27 (×2): 1 via ORAL
  Filled 2019-08-25 (×3): qty 1

## 2019-08-25 MED ORDER — SODIUM CHLORIDE 0.9 % IV SOLN
INTRAVENOUS | Status: DC | PRN
Start: 2019-08-25 — End: 2019-08-25

## 2019-08-25 MED ORDER — MEPERIDINE HCL 50 MG/ML IJ SOLN: 6.2500 mg | INTRAMUSCULAR | Status: DC | PRN

## 2019-08-25 MED ORDER — PROSOURCE PLUS PO LIQD
30.0000 mL | Freq: Every day | ORAL | Status: DC
  Administered 2019-08-25 – 2019-08-26 (×2): 30 mL via ORAL
  Filled 2019-08-25 (×3): qty 30

## 2019-08-25 MED ORDER — MEPERIDINE HCL 50 MG/ML IJ SOLN
INTRAMUSCULAR | Status: AC
  Administered 2019-08-25: 6.25 mg via INTRAVENOUS
  Filled 2019-08-25: qty 1

## 2019-08-25 MED ORDER — FENTANYL CITRATE (PF) 100 MCG/2ML IJ SOLN: 25.0000 ug | INTRAMUSCULAR | Status: DC | PRN

## 2019-08-25 MED ORDER — ACETAMINOPHEN 10 MG/ML IV SOLN: 1000.0000 mg | Freq: Once | INTRAVENOUS | Status: DC | PRN

## 2019-08-25 MED ORDER — TRANEXAMIC ACID-NACL 1000-0.7 MG/100ML-% IV SOLN
1000.0000 mg | INTRAVENOUS | Status: AC
  Administered 2019-08-25: 1000 mg via INTRAVENOUS
  Filled 2019-08-25: qty 100

## 2019-08-25 MED ORDER — ACETAMINOPHEN 325 MG PO TABS: 325.0000 mg | ORAL_TABLET | Freq: Four times a day (QID) | ORAL | Status: DC | PRN

## 2019-08-25 MED ORDER — PROPOFOL 500 MG/50ML IV EMUL
INTRAVENOUS | Status: DC | PRN
  Administered 2019-08-25: 75 ug/kg/min via INTRAVENOUS

## 2019-08-25 MED ORDER — MAGNESIUM CITRATE PO SOLN: 1.0000 | Freq: Once | ORAL | Status: DC | PRN

## 2019-08-25 MED ORDER — PHENOL 1.4 % MT LIQD: 1.0000 | OROMUCOSAL | Status: DC | PRN

## 2019-08-25 MED ORDER — ONDANSETRON HCL 4 MG/2ML IJ SOLN
INTRAMUSCULAR | Status: DC | PRN
  Administered 2019-08-25: 4 mg via INTRAVENOUS

## 2019-08-25 MED ORDER — CEFAZOLIN SODIUM-DEXTROSE 2-4 GM/100ML-% IV SOLN
2.0000 g | Freq: Four times a day (QID) | INTRAVENOUS | Status: AC
  Administered 2019-08-25 – 2019-08-26 (×2): 2 g via INTRAVENOUS
  Filled 2019-08-25 (×2): qty 100

## 2019-08-25 MED ORDER — STERILE WATER FOR IRRIGATION IR SOLN
Status: DC | PRN
  Administered 2019-08-25: 2000 mL

## 2019-08-25 MED ORDER — ALUM & MAG HYDROXIDE-SIMETH 200-200-20 MG/5ML PO SUSP: 30.0000 mL | ORAL | Status: DC | PRN

## 2019-08-25 MED ORDER — ONDANSETRON HCL 4 MG/2ML IJ SOLN
INTRAMUSCULAR | Status: AC
  Filled 2019-08-25: qty 2

## 2019-08-25 MED ORDER — ASPIRIN EC 325 MG PO TBEC
325.0000 mg | DELAYED_RELEASE_TABLET | Freq: Two times a day (BID) | ORAL | Status: DC
  Administered 2019-08-26 – 2019-08-27 (×3): 325 mg via ORAL
  Filled 2019-08-25 (×3): qty 1

## 2019-08-25 MED ORDER — FENTANYL CITRATE (PF) 100 MCG/2ML IJ SOLN
INTRAMUSCULAR | Status: DC | PRN
  Administered 2019-08-25 (×2): 25 ug via INTRAVENOUS

## 2019-08-25 MED ORDER — CHLORHEXIDINE GLUCONATE CLOTH 2 % EX PADS
6.0000 | MEDICATED_PAD | Freq: Every day | CUTANEOUS | Status: DC
  Administered 2019-08-25 – 2019-08-26 (×2): 6 via TOPICAL

## 2019-08-25 MED ORDER — ONDANSETRON HCL 4 MG PO TABS: 4.0000 mg | ORAL_TABLET | Freq: Four times a day (QID) | ORAL | Status: DC | PRN

## 2019-08-25 MED ORDER — METOCLOPRAMIDE HCL 5 MG/ML IJ SOLN
5.0000 mg | Freq: Three times a day (TID) | INTRAMUSCULAR | Status: DC | PRN
  Administered 2019-08-25: 10 mg via INTRAVENOUS
  Filled 2019-08-25: qty 2

## 2019-08-25 MED ORDER — ONDANSETRON HCL 4 MG/2ML IJ SOLN: 4.0000 mg | Freq: Once | INTRAMUSCULAR | Status: DC | PRN

## 2019-08-25 SURGICAL SUPPLY — 49 items
BAG DECANTER FOR FLEXI CONT (MISCELLANEOUS) IMPLANT
BAG ZIPLOCK 12X15 (MISCELLANEOUS) IMPLANT
BLADE SAG 18X100X1.27 (BLADE) ×2 IMPLANT
BLADE SURG SZ10 CARB STEEL (BLADE) ×4 IMPLANT
COVER PERINEAL POST (MISCELLANEOUS) ×2 IMPLANT
COVER SURGICAL LIGHT HANDLE (MISCELLANEOUS) ×2 IMPLANT
COVER WAND RF STERILE (DRAPES) IMPLANT
CUP ACET PINNACLE SECTR 50MM (Hips) IMPLANT
DERMABOND ADVANCED (GAUZE/BANDAGES/DRESSINGS) ×1
DERMABOND ADVANCED .7 DNX12 (GAUZE/BANDAGES/DRESSINGS) ×1 IMPLANT
DRAPE STERI IOBAN 125X83 (DRAPES) ×2 IMPLANT
DRAPE U-SHAPE 47X51 STRL (DRAPES) ×4 IMPLANT
DRESSING AQUACEL AG SP 3.5X10 (GAUZE/BANDAGES/DRESSINGS) ×1 IMPLANT
DRSG AQUACEL AG ADV 3.5X10 (GAUZE/BANDAGES/DRESSINGS) ×1 IMPLANT
DRSG AQUACEL AG SP 3.5X10 (GAUZE/BANDAGES/DRESSINGS) ×2
DURAPREP 26ML APPLICATOR (WOUND CARE) ×2 IMPLANT
ELECT REM PT RETURN 15FT ADLT (MISCELLANEOUS) ×2 IMPLANT
ELIMINATOR HOLE APEX DEPUY (Hips) ×1 IMPLANT
GLOVE BIO SURGEON STRL SZ 6 (GLOVE) ×4 IMPLANT
GLOVE BIOGEL PI IND STRL 6.5 (GLOVE) ×1 IMPLANT
GLOVE BIOGEL PI IND STRL 7.5 (GLOVE) ×1 IMPLANT
GLOVE BIOGEL PI IND STRL 8.5 (GLOVE) ×1 IMPLANT
GLOVE BIOGEL PI INDICATOR 6.5 (GLOVE) ×1
GLOVE BIOGEL PI INDICATOR 7.5 (GLOVE) ×1
GLOVE BIOGEL PI INDICATOR 8.5 (GLOVE) ×1
GLOVE ECLIPSE 8.0 STRL XLNG CF (GLOVE) ×4 IMPLANT
GLOVE ORTHO TXT STRL SZ7.5 (GLOVE) ×4 IMPLANT
GOWN STRL REUS W/TWL LRG LVL3 (GOWN DISPOSABLE) ×4 IMPLANT
GOWN STRL REUS W/TWL XL LVL3 (GOWN DISPOSABLE) ×2 IMPLANT
HEAD FEM STD 32X+1 STRL (Hips) ×1 IMPLANT
HOLDER FOLEY CATH W/STRAP (MISCELLANEOUS) ×2 IMPLANT
KIT TURNOVER KIT A (KITS) IMPLANT
LINER ACET PNNCL PLUS4 NEUTRAL (Hips) IMPLANT
PACK ANTERIOR HIP CUSTOM (KITS) ×2 IMPLANT
PENCIL SMOKE EVACUATOR (MISCELLANEOUS) IMPLANT
PINNACLE PLUS 4 NEUTRAL (Hips) ×2 IMPLANT
PINNACLE SECTOR CUP 50MM (Hips) ×2 IMPLANT
SCREW 6.5MMX30MM (Screw) ×1 IMPLANT
SLEEVE SUCTION CATH 165 (SLEEVE) ×1 IMPLANT
STEM FEMORAL SZ5 HIGH ACTIS (Stem) ×1 IMPLANT
SUT MNCRL AB 4-0 PS2 18 (SUTURE) ×2 IMPLANT
SUT STRATAFIX 0 PDS 27 VIOLET (SUTURE) ×2
SUT VIC AB 1 CT1 36 (SUTURE) ×6 IMPLANT
SUT VIC AB 2-0 CT1 27 (SUTURE) ×4
SUT VIC AB 2-0 CT1 TAPERPNT 27 (SUTURE) ×2 IMPLANT
SUTURE STRATFX 0 PDS 27 VIOLET (SUTURE) ×1 IMPLANT
TRAY FOLEY MTR SLVR 16FR STAT (SET/KITS/TRAYS/PACK) IMPLANT
WATER STERILE IRR 1000ML POUR (IV SOLUTION) ×2 IMPLANT
YANKAUER SUCT BULB TIP 10FT TU (MISCELLANEOUS) IMPLANT

## 2019-08-25 NOTE — Progress Notes (Signed)
Patient ID: Janet Owen, female   DOB: 11-22-1933, 86 y.o.   MRN: [id number] Comfortable this am, no events Only complaint is right hip pain, right forearm abrasion - dressed  AFVSS Labs reviewed and stable  RLE shortened and externally rotated NVI  Assessment: Right femoral neck fracture  Plan: To OR today for right THR to manage hip fracture NPO Consent ordered Procedure reviewed as well as post op expectations and plans

## 2019-08-25 NOTE — Anesthesia Postprocedure Evaluation (Signed)
Anesthesia Post Note  Patient: Janet Owen  Procedure(s) Performed: TOTAL HIP ARTHROPLASTY ANTERIOR APPROACH (Right Hip)     Patient location during evaluation: PACU Anesthesia Type: Spinal Level of consciousness: awake Pain management: pain level controlled Vital Signs Assessment: post-procedure vital signs reviewed and stable Respiratory status: spontaneous breathing Cardiovascular status: stable Postop Assessment: no headache, no backache, spinal receding and no apparent nausea or vomiting Anesthetic complications: no   No complications documented.  Last Vitals:  Vitals:   08/25/19 1745 08/25/19 1800  BP: 116/65 (!) 129/102  Pulse: 66 70  Resp: 13 12  Temp: 36.7 C   SpO2: 100% 98%    Last Pain:  Vitals:   08/25/19 1800  TempSrc:   PainSc: 0-No pain                 Huston Foley

## 2019-08-25 NOTE — Anesthesia Preprocedure Evaluation (Signed)
Anesthesia Evaluation  Patient identified by MRN, date of birth, ID band Patient awake    Reviewed: Allergy & Precautions, NPO status , Patient's Chart, lab work & pertinent test results, reviewed documented beta blocker date and time   Airway Mallampati: II       Dental no notable dental hx.    Pulmonary    Pulmonary exam normal        Cardiovascular hypertension, Pt. on home beta blockers +CHF  Normal cardiovascular exam     Neuro/Psych negative neurological ROS  negative psych ROS   GI/Hepatic negative GI ROS, Neg liver ROS,   Endo/Other  diabetes, Type 2, Oral Hypoglycemic Agents, Insulin Dependent  Renal/GU Renal disease  negative genitourinary   Musculoskeletal   Abdominal Normal abdominal exam  (+)   Peds  Hematology   Anesthesia Other Findings   Reproductive/Obstetrics                             Anesthesia Physical Anesthesia Plan  ASA: III  Anesthesia Plan: Spinal   Post-op Pain Management:  Regional for Post-op pain   Induction:   PONV Risk Score and Plan: 3 and Ondansetron, TIVA and Propofol infusion  Airway Management Planned: Natural Airway and Simple Face Mask  Additional Equipment: None  Intra-op Plan:   Post-operative Plan:   Informed Consent: I have reviewed the patients History and Physical, chart, labs and discussed the procedure including the risks, benefits and alternatives for the proposed anesthesia with the patient or authorized representative who has indicated his/her understanding and acceptance.       Plan Discussed with: CRNA  Anesthesia Plan Comments:         Anesthesia Quick Evaluation

## 2019-08-25 NOTE — Progress Notes (Signed)
PROGRESS NOTE  Janet Owen ZOX:[id number] DOB: Jul 05, 1933 DOA: 08/24/2019 PCP: Bartholome Bill, MD   LOS: 1 day   Brief Narrative / Interim history: 86 year old female with NICM with EF 35% with biventricular ICD, A. fib status post ablation, hypertension, hyperlipidemia, diabetes, who was in her usual state of health when she was working in her house and lost her footing and fell onto the right hip.  Patient recalls the event, denies any chest pain, lightheadedness or dizziness or syncopal episodes.  She was unable to stand after the fall, she was brought to the hospital found to have right hip fracture.  Orthopedic surgery consulted and plan is for surgery today.  Subjective / 24h Interval events: She is doing well, denies any pain if she is not moving.  She was pretty active prior to the fall, doing all her ADLs without any significant chest pain, shortness of breath or any other concerns.  Assessment & Plan: Principal Problem Right-sided hip fracture-orthopedic surgery has been consulted, Dr. Alvan Dame evaluated patient this morning, she will undergo hip arthroplasty later today.  DVT prophylaxis/resumption of her anticoagulation per orthopedic surgery  Active Problems Chronic systolic CHF-ejection fraction is about 30%, currently appears compensated.  She has trace edema which is chronic for her.  Hold Lasix for now until after surgery  Atrial flutter status post ablation-during ablation procedure she had transient A. fib for a few minutes, previously on anticoagulation.  Admitting MD discussed with Dr. Einar Gip who felt it was reasonable not to continue any further anticoagulation at this point since her A. fib was very transient and she was maintaining sinus rhythm since her ablation.  Continue amiodarone  Chronic kidney disease stage IIIb-Baseline creatinine 1.551.6 in 2018, currently at 1.7 and stable, suspect this is her baseline now  Insulin-dependent diabetes mellitus, A1c  7.6, controlled for her age group-continue Lantus at lower dose, continue sliding scale.  CBG (last 3)  Recent Labs    08/24/19 2108 08/25/19 0010 08/25/19 0752  GLUCAP 116* 173* 112*   Hyperlipidemia-continue atorvastatin  Essential hypertension-continue Coreg, blood pressure normal this morning.  Scheduled Meds: . amiodarone  100 mg Oral Daily  . atorvastatin  20 mg Oral Daily  . carvedilol  12.5 mg Oral BID WC  . chlorhexidine  60 mL Topical Once  . ezetimibe  5 mg Oral Daily  . insulin aspart  0-15 Units Subcutaneous Q4H  . insulin glargine  15 Units Subcutaneous QHS  . mupirocin ointment  1 application Nasal BID   Continuous Infusions: . methocarbamol (ROBAXIN) IV     PRN Meds:.HYDROcodone-acetaminophen, methocarbamol **OR** methocarbamol (ROBAXIN) IV, morphine injection, polyethylene glycol  Diet Orders (From admission, onward)    Start     Ordered   08/24/19 2107  Diet regular Room service appropriate? Yes; Fluid consistency: Thin  Diet effective now       Question Answer Comment  Room service appropriate? Yes   Fluid consistency: Thin      08/24/19 2106          DVT prophylaxis:      Code Status: DNR  Family Communication: no family at bedside  Status is: Inpatient  Remains inpatient appropriate because:Inpatient level of care appropriate due to severity of illness   Dispo: The patient is from: Home              Anticipated d/c is to: SNF              Anticipated d/c date is: 3  days              Patient currently is not medically stable to d/c.  Consultants:  Orthopedic surgery   Procedures:  None   Microbiology  None   Antimicrobials: None     Objective: Vitals:   08/24/19 1713 08/24/19 2134 08/25/19 0012 08/25/19 0458  BP: (!) 158/76 (!) 144/71 137/66 127/61  Pulse: (!) 59 60 (!) 59 (!) 59  Resp: 18 18 17 19   Temp: 98.4 F (36.9 C) 98.6 F (37 C) 98.9 F (37.2 C) 97.8 F (36.6 C)  TempSrc:  Oral Oral Oral  SpO2: 100% 100%  95% 97%  Weight:      Height:        Intake/Output Summary (Last 24 hours) at 08/25/2019 1004 Last data filed at 08/25/2019 0600 Gross per 24 hour  Intake 120 ml  Output 1000 ml  Net -880 ml   Filed Weights   08/24/19 1512  Weight: 84.1 kg    Examination:  Constitutional: NAD Eyes: no scleral icterus ENMT: Mucous membranes are moist.  Neck: normal, supple Respiratory: clear to auscultation bilaterally, no wheezing, no crackles. Normal respiratory effort. No accessory muscle use.  Cardiovascular: Regular rate and rhythm, no murmurs / rubs / gallops. Trace LE edema. Abdomen: non distended, no tenderness. Bowel sounds positive.  Musculoskeletal: no clubbing / cyanosis.  Skin: no rashes Neurologic: CN 2-12 grossly intact. Strength equal but right lower extremity deferred due to fracture Psychiatric: Normal judgment and insight. Alert and oriented x 3. Normal mood.   Data Reviewed: I have independently reviewed following labs and imaging studies   CBC: Recent Labs  Lab 08/24/19 1241 08/25/19 0319  WBC 9.1 10.6*  NEUTROABS 7.5  --   HGB 15.3* 14.7  HCT 48.0* 46.0  MCV 90.9 90.4  PLT 126* 132*   Basic Metabolic Panel: Recent Labs  Lab 08/24/19 1241 08/25/19 0319  NA 140 137  K 4.5 4.1  CL 102 101  CO2 28 25  GLUCOSE 170* 188*  BUN 34* 32*  CREATININE 1.72* 1.72*  CALCIUM 9.9 9.7   Liver Function Tests: No results for input(s): AST, ALT, ALKPHOS, BILITOT, PROT, ALBUMIN in the last 168 hours. Coagulation Profile: No results for input(s): INR, PROTIME in the last 168 hours. HbA1C: Recent Labs    08/24/19 1241  HGBA1C 7.6*   CBG: Recent Labs  Lab 08/24/19 1710 08/24/19 2108 08/25/19 0010 08/25/19 0752  GLUCAP 125* 116* 173* 112*    Recent Results (from the past 240 hour(s))  SARS Coronavirus 2 by RT PCR (hospital order, performed in [facility] hospital lab) Nasopharyngeal Nasopharyngeal Swab     Status: None   Collection Time: 08/24/19  2:04 PM    Specimen: Nasopharyngeal Swab  Result Value Ref Range Status   SARS Coronavirus 2 NEGATIVE NEGATIVE Final    Comment: (NOTE) SARS-CoV-2 target nucleic acids are NOT DETECTED.  The SARS-CoV-2 RNA is generally detectable in upper and lower respiratory specimens during the acute phase of infection. The lowest concentration of SARS-CoV-2 viral copies this assay can detect is 250 copies / mL. A negative result does not preclude SARS-CoV-2 infection and should not be used as the sole basis for treatment or other patient management decisions.  A negative result may occur with improper specimen collection / handling, submission of specimen other than nasopharyngeal swab, presence of viral mutation(s) within the areas targeted by this assay, and inadequate number of viral copies (<250 copies / mL). A negative result  must be combined with clinical observations, patient history, and epidemiological information.  Fact Sheet for Patients:   StrictlyIdeas.no  Fact Sheet for Healthcare Providers: BankingDealers.co.za  This test is not yet approved or  cleared by the [place] FDA and has been authorized for detection and/or diagnosis of SARS-CoV-2 by FDA under an Emergency Use Authorization (EUA).  This EUA will remain in effect (meaning this test can be used) for the duration of the COVID-19 declaration under Section 564(b)(1) of the Act, 21 U.S.C. section 360bbb-3(b)(1), unless the authorization is terminated or revoked sooner.  Performed at [facility], [place] [address], [place], [place] [address]   Surgical PCR screen     Status: None   Collection Time: 08/25/19 12:19 AM   Specimen: Nasal Mucosa; Nasal Swab  Result Value Ref Range Status   MRSA, PCR NEGATIVE NEGATIVE Final   Staphylococcus aureus NEGATIVE NEGATIVE Final    Comment: (NOTE) The Xpert SA Assay (FDA approved for NASAL specimens in patients 23 years of age  and older), is one component of a comprehensive surveillance program. It is not intended to diagnose infection nor to guide or monitor treatment. Performed at [facility], [place] [address] [place], [place] [address]      Radiology Studies: DG Chest 1 View  Result Date: 08/24/2019 CLINICAL DATA:  Status post fall today.  Right hip fracture. EXAM: CHEST  1 VIEW COMPARISON:  PA and lateral chest 11/12/2015. FINDINGS: Pacing device is unchanged. Heart size is upper normal. Lungs clear. No pneumothorax or pleural fluid. No acute or focal bony abnormality. IMPRESSION: No acute disease. Electronically Signed   By: Inge Rise M.D.   On: 08/24/2019 13:25   DG Hip Unilat W or Wo Pelvis 2-3 Views Right  Result Date: 08/24/2019 CLINICAL DATA:  Right hip pain after a fall today. Initial encounter. EXAM: DG HIP (WITH OR WITHOUT PELVIS) 2-3V RIGHT COMPARISON:  None. FINDINGS: The patient has an acute subcapital fracture of the right hip. The femoral head is located. No other acute abnormality is identified. Lower lumbar degenerative change noted. IMPRESSION: Acute subcapital fracture right hip. Electronically Signed   By: Inge Rise M.D.   On: 08/24/2019 13:24    Marzetta Board, MD, PhD [facility]  Between 7 am - 7 pm I am available, please contact me via Amion or Securechat  Between 7 pm - 7 am I am not available, please contact night coverage MD/APP via Amion

## 2019-08-25 NOTE — Anesthesia Procedure Notes (Addendum)
Spinal  Patient location during procedure: OR Start time: 08/25/2019 4:07 PM End time: 08/25/2019 4:11 PM Staffing Performed: anesthesiologist  Anesthesiologist: Lyn Hollingshead, MD Preanesthetic Checklist Completed: patient identified, IV checked, site marked, risks and benefits discussed, surgical consent, monitors and equipment checked, pre-op evaluation and timeout performed Spinal Block Patient position: sitting Prep: DuraPrep and site prepped and draped Patient monitoring: continuous pulse ox and blood pressure Approach: midline Location: L3-4 Injection technique: single-shot Needle Needle type: Pencan  Needle gauge: 24 G Needle length: 10 cm Needle insertion depth: 4 cm Assessment Sensory level: T8

## 2019-08-25 NOTE — Op Note (Signed)
NAME:  HELLON VACCARELLA                ACCOUNT NO.: [id number]      MEDICAL RECORD NO.: [id number]      FACILITY:  [facility]      PHYSICIAN:  Mauri Pole  DATE OF BIRTH:  [birth year]     DATE OF PROCEDURE:  08/25/2019                                 OPERATIVE REPORT         PREOPERATIVE DIAGNOSIS: Right hip femoral neck fracture      POSTOPERATIVE DIAGNOSIS:  Right hip femoral neck fracture      PROCEDURE:  Right total hip replacement through an anterior approach   utilizing DePuy THR system, component size 50 mm pinnacle cup, a size 32+4 neutral   Altrex liner, a size 5 Hi Actis stem with a 32+1 Articuleze metal head ball.      SURGEON:  Pietro Cassis. Alvan Dame, M.D.      ASSISTANT:  Danae Orleans, PA-C     ANESTHESIA:  Regional and Spinal.      SPECIMENS:  None.      COMPLICATIONS:  None.      BLOOD LOSS:  350 cc     DRAINS:  None.      INDICATION OF THE PROCEDURE:  Janet Owen is a 86 y.o. female who had   presented to the Emergency Room after ground level fall at her home.  She was broought to the ER due to the inability to bear weight and her pain.  Radiogrpahs revealed a displaced right femoral neck fracture.  She was admitted to the medical service and Orthopaedics was consulted for management.  I reviewed with her the fracture and treatment options.  For best short term and long term benefit I recommended and discussed total hip replacement.  Consent was obtained for   benefit of pain relief.  Specific risks of infection, DVT, component   failure, dislocation, neurovascular injury, and need for revision surgery were reviewed.     PROCEDURE IN DETAIL:  The patient was brought to operative theater.   Once adequate anesthesia, preoperative antibiotics, 2 gm of Ancef, 1 gm of Tranexamic Acid, and 10 mg of Decadron were administered, the patient was positioned supine on the [employer] table.  Once the patient was safely positioned with adequate  padding of boney prominences we predraped out the hip, and used fluoroscopy to confirm orientation of the pelvis.      The right hip was then prepped and draped from proximal iliac crest to   mid thigh with a shower curtain technique.      Time-out was performed identifying the patient, planned procedure, and the appropriate extremity.     An incision was then made 2 cm lateral to the   anterior superior iliac spine extending over the orientation of the   tensor fascia lata muscle and sharp dissection was carried down to the   fascia of the muscle.      The fascia was then incised.  The muscle belly was identified and swept   laterally and retractor placed along the superior neck.  Following   cauterization of the circumflex vessels and removing some pericapsular   fat, a second cobra retractor was placed on the inferior neck.  A T-capsulotomy was made along the  line of the   superior neck to the trochanteric fossa, then extended proximally and   distally.  Tag sutures were placed and the retractors were then placed   intracapsular.  We then identified the femoral neck fracture and first made a neck cut from the trochanteric fossa to the medical with the femur on traction.  The fractured neck segment and the femoral   head were removed without difficulty or complication.  Traction was let   off and retractors were placed posterior and anterior around the   acetabulum.      The labrum and foveal tissue were debrided.  I began reaming with a 44 mm   reamer and reamed up to 49 mm reamer with good bony bed preparation and a 50 mm  cup was chosen.  The final 50 mm Pinnacle cup was then impacted under fluoroscopy to confirm the depth of penetration and orientation with respect to   Abduction and forward flexion.  A screw was placed into the ilium followed by the hole eliminator.  The final   32+4 neutral Altrex liner was impacted with good visualized rim fit.  The cup was positioned anatomically  within the acetabular portion of the pelvis.      At this point, the femur was rolled to 100 degrees.  Further capsule was   released off the inferior aspect of the femoral neck.  I then   released the superior capsule proximally.  With the leg in a neutral position the hook was placed laterally   along the femur under the vastus lateralis origin and elevated manually and then held in position using the hook attachment on the bed.  The leg was then extended and adducted with the leg rolled to 100   degrees of external rotation.  Retractors were placed along the medial calcar and posteriorly over the greater trochanter.  Once the proximal femur was fully   exposed, I used a box osteotome to set orientation.  I then began   broaching with the starting chili pepper broach and passed this by hand and then broached up to 5.  With the 5 broach in place I chose a high offset neck and did several trial reductions.  The offset was appropriate, leg lengths   appeared to be equal best matched with the +1 head ball trial confirmed radiographically.   Given these findings, I went ahead and dislocated the hip, repositioned all   retractors and positioned the right hip in the extended and abducted position.  The final 5 Hi Actis stem was   chosen and it was impacted down to the level of neck cut.  Based on this   and the trial reductions, a final 32+1 Articuleze metal head ball was chosen and   impacted onto a clean and dry trunnion, and the hip was reduced.  The   hip had been irrigated throughout the case again at this point.  I did   reapproximate the superior capsular leaflet to the anterior leaflet   using #1 Vicryl.  The fascia of the   tensor fascia lata muscle was then reapproximated using #1 Vicryl and #0 Stratafix sutures.  The   remaining wound was closed with 2-0 Vicryl and running 4-0 Monocryl.   The hip was cleaned, dried, and dressed sterilely using Dermabond and   Aquacel dressing.  The  patient was then brought   to recovery room in stable condition tolerating the procedure well.    Danae Orleans, PA-C  was present for the entirety of the case involved from   preoperative positioning, perioperative retractor management, general   facilitation of the case, as well as primary wound closure as assistant.            Pietro Cassis Alvan Dame, M.D.        08/25/2019 2:58 PM

## 2019-08-25 NOTE — Anesthesia Procedure Notes (Signed)
Anesthesia Regional Block: Fascia iliaca block   Pre-Anesthetic Checklist: ,, timeout performed, Correct Patient, Correct Site, Correct Laterality, Correct Procedure, Correct Position, site marked, Risks and benefits discussed,  Surgical consent,  Pre-op evaluation,  At surgeon's request and post-op pain management  Laterality: Right and Lower  Prep: chloraprep       Needles:      Needle Length: 9cm  Needle Gauge: 20   Needle insertion depth: 3 cm   Additional Needles:   Narrative:  Start time: 08/25/2019 3:50 PM End time: 08/25/2019 4:00 PM Injection made incrementally with aspirations every 5 mL.  Performed by: Personally  Anesthesiologist: Lyn Hollingshead, MD

## 2019-08-25 NOTE — Transfer of Care (Signed)
Immediate Anesthesia Transfer of Care Note  Patient: Janet Owen  Procedure(s) Performed: TOTAL HIP ARTHROPLASTY ANTERIOR APPROACH (Right Hip)  Patient Location: PACU  Anesthesia Type:Spinal  Level of Consciousness: awake, alert , oriented and patient cooperative  Airway & Oxygen Therapy: Patient Spontanous Breathing and Patient connected to face mask oxygen  Post-op Assessment: Report given to RN and Post -op Vital signs reviewed and stable  Post vital signs: stable  Last Vitals:  Vitals Value Taken Time  BP 116/65 08/25/19 1745  Temp    Pulse 67 08/25/19 1748  Resp 13 08/25/19 1748  SpO2 99 % 08/25/19 1748  Vitals shown include unvalidated device data.  Last Pain:  Vitals:   08/25/19 1300  TempSrc: Oral  PainSc:       Patients Stated Pain Goal: 2 (62/94/76 5465)  Complications: No complications documented.

## 2019-08-25 NOTE — Anesthesia Procedure Notes (Signed)
Procedure Name: MAC Date/Time: 08/25/2019 4:02 PM Performed by: Lissa Morales, CRNA Pre-anesthesia Checklist: Patient identified, Emergency Drugs available, Suction available, Patient being monitored and Timeout performed Patient Re-evaluated:Patient Re-evaluated prior to induction Oxygen Delivery Method: Simple face mask Placement Confirmation: positive ETCO2

## 2019-08-25 NOTE — Interval H&P Note (Signed)
History and Physical Interval Note:  08/25/2019 1:48 PM  Janet Owen  has presented today for surgery, with the diagnosis of right hip fracture.  The various methods of treatment have been discussed with the patient and family. After consideration of risks, benefits and other options for treatment, the patient has consented to  Procedure(s): TOTAL HIP ARTHROPLASTY ANTERIOR APPROACH (Right) as a surgical intervention.  The patient's history has been reviewed, patient examined, no change in status, stable for surgery.  I have reviewed the patient's chart and labs.  Questions were answered to the patient's satisfaction.     Mauri Pole

## 2019-08-25 NOTE — Anesthesia Procedure Notes (Addendum)
Performed by: Lissa Morales, CRNA

## 2019-08-25 NOTE — Progress Notes (Signed)
Patient retaining urine. Bladder scan resulted in 450 mL. Paged [facility] for order to insert foley catheter.

## 2019-08-25 NOTE — H&P (View-Only) (Signed)
Patient ID: Janet Owen, female   DOB: 03/02/1933, 86 y.o.   MRN: [id number] Comfortable this am, no events Only complaint is right hip pain, right forearm abrasion - dressed  AFVSS Labs reviewed and stable  RLE shortened and externally rotated NVI  Assessment: Right femoral neck fracture  Plan: To OR today for right THR to manage hip fracture NPO Consent ordered Procedure reviewed as well as post op expectations and plans

## 2019-08-25 NOTE — Progress Notes (Signed)
Initial Nutrition Assessment  DOCUMENTATION CODES:   Not applicable  INTERVENTION:  - diet advancement as medically feasible. - will order Ensure Surgery once/day, each supplement provides 330 kcal and 18 grams protein. - will order 30 ml Prosource Plus once/day, each supplement provides 100 kcal and 15 grams protein. - will order 1 tablet multivitamin with minerals.  NUTRITION DIAGNOSIS:   Increased nutrient needs related to post-op healing as evidenced by estimated needs.   GOAL:   Patient will meet greater than or equal to 90% of their needs  MONITOR:   PO intake, Supplement acceptance, Labs, Weight trends  REASON FOR ASSESSMENT:   Consult Assessment of nutrition requirement/status  ASSESSMENT:   86 year old female with medical history of NICM with EF 35% with biventricular ICD, A. fib s/p ablation, HTN, hyperlipidemia, and DM. She was in her usual state of health and walking around her house when she lost her footing and fell onto her R hip. She was unable to stand after the fall. Orthopedic surgery consulted and plan for surgery on 7/13.  She has been NPO since admission with plan to go to the OR today for total R hip replacement to fix R hip fracture. Will order supplements as outlined above d/t increased needs.   Per chart review, weight on 7/12 was 185 lb and PTA the most recently documented weight was on 05/28/19 when she weighed 186 lb. Prior to that, most recent weight was on 01/02/17 when she weighed 184 lb.   Patient denies any recent changes in appetite or weight changes. No issues with chewing or swallowing at baseline.    Labs reviewed; CBGs: 173 and 112 mg/dl, BUN: 32 mg/dl, creatinine: 1.72 mg/dl, GFR: 27 ml/min. Medications reviewed; sliding scale novolog, 15 units lantus/day.     NUTRITION - FOCUSED PHYSICAL EXAM:  completed; no fat depletions, no muscle depletions  Diet Order:   Diet Order            Diet regular Room service appropriate? Yes;  Fluid consistency: Thin  Diet effective now                 EDUCATION NEEDS:   No education needs have been identified at this time  Skin:  Skin Assessment: Reviewed RN Assessment  Last BM:  7/11  Height:   Ht Readings from Last 1 Encounters:  08/24/19 5\' 7"  (1.702 m)    Weight:   Wt Readings from Last 1 Encounters:  08/24/19 84.1 kg     Estimated Nutritional Needs:  Kcal:  1850-2050 kcal Protein:  85-95 grams Fluid:  >/= 2 L/day     Jarome Matin, MS, RD, LDN, CNSC Inpatient Clinical Dietitian RD pager # available in AMION  After hours/weekend pager # available in [facility]

## 2019-08-25 NOTE — Progress Notes (Signed)
PT Cancellation Note  Patient Details Name: CORRIE REDER MRN: [id number] DOB: March 21, 1933   Cancelled Treatment:    Reason Eval/Treat Not Completed: Medical issues which prohibited therapy, to have hip surgery today.    Claretha Cooper 08/25/2019, [place] Pager [phone] Office [phone]

## 2019-08-26 ENCOUNTER — Encounter (HOSPITAL_COMMUNITY): Payer: Self-pay | Admitting: Orthopedic Surgery

## 2019-08-26 DIAGNOSIS — S72001A Fracture of unspecified part of neck of right femur, initial encounter for closed fracture: Secondary | ICD-10-CM

## 2019-08-26 LAB — BASIC METABOLIC PANEL
Anion gap: 11 (ref 5–15)
BUN: 34 mg/dL — ABNORMAL HIGH (ref 8–23)
CO2: 24 mmol/L (ref 22–32)
Calcium: 9.5 mg/dL (ref 8.9–10.3)
Chloride: 102 mmol/L (ref 98–111)
Creatinine, Ser: 1.56 mg/dL — ABNORMAL HIGH (ref 0.44–1.00)
GFR calc Af Amer: 35 mL/min — ABNORMAL LOW (ref >60)
GFR calc non Af Amer: 30 mL/min — ABNORMAL LOW (ref >60)
Glucose, Bld: 215 mg/dL — ABNORMAL HIGH (ref 70–99)
Potassium: 5.1 mmol/L (ref 3.5–5.1)
Sodium: 137 mmol/L (ref 135–145)

## 2019-08-26 LAB — CBC
HCT: 40.5 % (ref 36.0–46.0)
Hemoglobin: 13 g/dL (ref 12.0–15.0)
MCH: 29.4 pg (ref 26.0–34.0)
MCHC: 32.1 g/dL (ref 30.0–36.0)
MCV: 91.6 fL (ref 80.0–100.0)
Platelets: 101 10*3/uL — ABNORMAL LOW (ref 150–400)
RBC: 4.42 MIL/uL (ref 3.87–5.11)
RDW: 13.2 % (ref 11.5–15.5)
WBC: 8 10*3/uL (ref 4.0–10.5)
nRBC: 0 % (ref 0.0–0.2)

## 2019-08-26 LAB — GLUCOSE, CAPILLARY
Glucose-Capillary: 127 mg/dL — ABNORMAL HIGH (ref 70–99)
Glucose-Capillary: 148 mg/dL — ABNORMAL HIGH (ref 70–99)
Glucose-Capillary: 171 mg/dL — ABNORMAL HIGH (ref 70–99)
Glucose-Capillary: 203 mg/dL — ABNORMAL HIGH (ref 70–99)
Glucose-Capillary: 229 mg/dL — ABNORMAL HIGH (ref 70–99)
Glucose-Capillary: 240 mg/dL — ABNORMAL HIGH (ref 70–99)

## 2019-08-26 LAB — ABO/RH: ABO/RH(D): A NEG

## 2019-08-26 LAB — SARS CORONAVIRUS 2 BY RT PCR (HOSPITAL ORDER, PERFORMED IN ~~LOC~~ HOSPITAL LAB): SARS Coronavirus 2: NEGATIVE

## 2019-08-26 MED ORDER — HYDROCODONE-ACETAMINOPHEN 5-325 MG PO TABS: 1.0000 | ORAL_TABLET | Freq: Four times a day (QID) | ORAL | 0 refills | Status: DC | PRN

## 2019-08-26 MED ORDER — INSULIN ASPART 100 UNIT/ML ~~LOC~~ SOLN
0.0000 [IU] | Freq: Three times a day (TID) | SUBCUTANEOUS | Status: DC
  Administered 2019-08-26: 2 [IU] via SUBCUTANEOUS
  Administered 2019-08-26: 5 [IU] via SUBCUTANEOUS
  Administered 2019-08-27 (×2): 3 [IU] via SUBCUTANEOUS

## 2019-08-26 MED ORDER — FUROSEMIDE 40 MG PO TABS
40.0000 mg | ORAL_TABLET | Freq: Every day | ORAL | Status: DC
  Administered 2019-08-27: 40 mg via ORAL
  Filled 2019-08-26: qty 1

## 2019-08-26 MED ORDER — POLYETHYLENE GLYCOL 3350 17 G PO PACK
17.0000 g | PACK | Freq: Every day | ORAL | Status: DC
  Administered 2019-08-26 – 2019-08-27 (×2): 17 g via ORAL
  Filled 2019-08-26: qty 1

## 2019-08-26 MED ORDER — METHOCARBAMOL 500 MG PO TABS: 500.0000 mg | ORAL_TABLET | Freq: Four times a day (QID) | ORAL | 0 refills | Status: DC | PRN

## 2019-08-26 NOTE — Plan of Care (Signed)

## 2019-08-26 NOTE — Progress Notes (Signed)
PROGRESS NOTE    Janet Owen  URK:[id number] DOB: 11-20-1933 DOA: 08/24/2019 PCP: Janet Bill, MD   Chief Complaint  Patient presents with   Fall   Hip Pain    Brief Narrative:  86 year old female with NICM with EF 35% with biventricular ICD, Janet Owen. fib status post ablation, hypertension, hyperlipidemia, diabetes, who was in her usual state of health when she was working in her house and lost her footing and fell onto the right hip.  Patient recalls the event, denies any chest pain, lightheadedness or dizziness or syncopal episodes.  She was unable to stand after the fall, she was brought to the hospital found to have right hip fracture.  Orthopedic surgery consulted and plan is for surgery today.  Assessment & Plan:   Principal Problem:   Hip fracture (Janet Owen) Active Problems:   Essential (primary) hypertension   Chronic systolic congestive heart failure (HCC)   Typical atrial flutter (HCC)   CKD (chronic kidney disease) stage 3, GFR 30-59 ml/min   Mixed dyslipidemia   Type 2 diabetes mellitus with hyperglycemia (Janet Owen)   ICD BV  09/15/2015:  St. Jude Medical [place] model [phone] (serial  Number [id number]) in situ   PAF (paroxysmal atrial fibrillation) (HCC)   DNR (do not resuscitate)  Right-sided hip fracture S/p R total hip replacement 7/13 ASA BID x 4 weeks for DVT ppx Pain management, bowel regimen WBAT on R leg Dressing to remain in place until f/u in clinic in 2 weeks PT/OT - lightheaded with therapy today, follow orthostatics  Chronic systolic CHF-ejection fraction 35% per Dr. Irven Owen recent not. currently appears compensated.   Resume lasix  Atrial flutter status post ablation-during ablation procedure she had transient Janet Owen. fib for Janet Owen few minutes, previously on anticoagulation.  Admitting MD discussed with Dr. Einar Owen who felt it was reasonable not to continue any further anticoagulation at this point since her Janet Owen. fib was very transient and she was  maintaining sinus rhythm since her ablation.  Continue amiodarone and coreg.  Chronic kidney disease stage IIIb-Baseline creatinine 1.5 in 2018. Improved, continue to monitor  Insulin-dependent diabetes mellitus, A1c 7.6, controlled for her age group-continue Lantus at lower dose, continue sliding scale.  Hyperlipidemia-continue atorvastatin  Essential hypertension-continue Coreg, blood pressure normal this morning.  Thrombocytopenia: continue to monitor  DVT prophylaxis: BID aspirin/SCD Code Status DNR Family Communication: none at bedside Disposition:   Status is: Inpatient  Remains inpatient appropriate because:Inpatient level of care appropriate due to severity of illness   Dispo:  Patient From: Home  Planned Disposition: Janet Owen  Expected discharge date: 08/28/19  Medically stable for discharge: No   Consultants:   orthopedics  Procedures:  R total hip replacement 7/13  Antimicrobials:  Anti-infectives (From admission, onward)   Start     Dose/Rate Route Frequency Ordered Stop   08/25/19 2200  ceFAZolin (ANCEF) IVPB 2g/100 mL premix        2 g 200 mL/hr over 30 Minutes Intravenous Every 6 hours 08/25/19 1840 08/26/19 0347   08/25/19 1500  ceFAZolin (ANCEF) IVPB 2g/100 mL premix        2 g 200 mL/hr over 30 Minutes Intravenous  Once 08/25/19 1407 08/25/19 1615      Subjective: Frustrated with how she did with therapy  Objective: Vitals:   08/26/19 1750 08/26/19 1756 08/26/19 1759 08/26/19 1801  BP: (!) 142/55 140/76 (!) 152/84 (!) 136/106  Pulse: 81 73 83 82  Resp:      Temp:  TempSrc:      SpO2: 94% 96%  94%  Weight:      Height:        Intake/Output Summary (Last 24 hours) at 08/26/2019 1916 Last data filed at 08/26/2019 1807 Gross per 24 hour  Intake 960 ml  Output 1355 ml  Net -395 ml   Filed Weights   08/24/19 1512  Weight: 84.1 kg    Examination:  General exam: Appears calm and comfortable  Respiratory  system: Clear to auscultation. Respiratory effort normal. Cardiovascular system: S1 & S2 heard, RRR.  Gastrointestinal system: Abdomen is nondistended, soft and nontender.  Central nervous system: Alert and oriented. No focal neurological deficits. Extremities: RLE with dressing intact Skin: No rashes, lesions or ulcers Psychiatry: Judgement and insight appear normal. Mood & affect appropriate.     Data Reviewed: I have personally reviewed following labs and imaging studies  CBC: Recent Labs  Lab 08/24/19 1241 08/25/19 0319 08/26/19 0340  WBC 9.1 10.6* 8.0  NEUTROABS 7.5  --   --   HGB 15.3* 14.7 13.0  HCT 48.0* 46.0 40.5  MCV 90.9 90.4 91.6  PLT 126* 125* 101*    Basic Metabolic Panel: Recent Labs  Lab 08/24/19 1241 08/25/19 0319 08/26/19 0340  NA 140 137 137  K 4.5 4.1 5.1  CL 102 101 102  CO2 28 25 24   GLUCOSE 170* 188* 215*  BUN 34* 32* 34*  CREATININE 1.72* 1.72* 1.56*  CALCIUM 9.9 9.7 9.5    GFR: Estimated Creatinine Clearance: 29.9 mL/min (Janet Owen) (by C-G formula based on SCr of 1.56 mg/dL (H)).  Liver Function Tests: No results for input(s): AST, ALT, ALKPHOS, BILITOT, PROT, ALBUMIN in the last 168 hours.  CBG: Recent Labs  Lab 08/25/19 2300 08/26/19 0317 08/26/19 0758 08/26/19 1219 08/26/19 1628  GLUCAP 226* 203* 148* 127* 240*     Recent Results (from the past 240 hour(s))  SARS Coronavirus 2 by RT PCR (hospital order, performed in [facility] hospital lab) Nasopharyngeal Nasopharyngeal Swab     Status: None   Collection Time: 08/24/19  2:04 PM   Specimen: Nasopharyngeal Swab  Result Value Ref Range Status   SARS Coronavirus 2 NEGATIVE NEGATIVE Final    Comment: (NOTE) SARS-CoV-2 target nucleic acids are NOT DETECTED.  The SARS-CoV-2 RNA is generally detectable in upper and lower respiratory specimens during the acute phase of infection. The lowest concentration of SARS-CoV-2 viral copies this assay can detect is 250 copies / mL. Janet Owen negative  result does not preclude SARS-CoV-2 infection and should not be used as the sole basis for treatment or other patient management decisions.  Janet Owen negative result may occur with improper specimen collection / handling, submission of specimen other than nasopharyngeal swab, presence of viral mutation(s) within the areas targeted by this assay, and inadequate number of viral copies (<250 copies / mL). Cobie Marcoux negative result must be combined with clinical observations, patient history, and epidemiological information.  Fact Sheet for Patients:   StrictlyIdeas.no  Fact Sheet for Healthcare Providers: BankingDealers.co.za  This test is not yet approved or  cleared by the [place] FDA and has been authorized for detection and/or diagnosis of SARS-CoV-2 by FDA under an Emergency Use Authorization (EUA).  This EUA will remain in effect (meaning this test can be used) for the duration of the COVID-19 declaration under Section 564(b)(1) of the Act, 21 U.S.C. section 360bbb-3(b)(1), unless the authorization is terminated or revoked sooner.  Performed at [facility], [address] [place], [place] [address]   Surgical PCR screen     Status: None   Collection Time: 08/25/19 12:19 AM   Specimen: Nasal Mucosa; Nasal Swab  Result Value Ref Range Status   MRSA, PCR NEGATIVE NEGATIVE Final   Staphylococcus aureus NEGATIVE NEGATIVE Final    Comment: (NOTE) The Xpert SA Assay (FDA approved for NASAL specimens in patients 12 years of age and older), is one component of Tomasz Steeves comprehensive surveillance program. It is not intended to diagnose infection nor to guide or monitor treatment. Performed at [facility], [place] [address] [place],  [address]          Radiology Studies: Pelvis Portable  Result Date: 08/25/2019 CLINICAL DATA:  Status post right total hip replacement. EXAM: PORTABLE PELVIS 1-2 VIEWS  COMPARISON:  Preoperative radiograph yesterday. FINDINGS: Right hip arthroplasty in expected alignment. No periprosthetic lucency or fracture. Recent postsurgical change includes air and edema in the soft tissues. IMPRESSION: Right hip arthroplasty without immediate postoperative complication. Electronically Signed   By: Keith Rake M.D.   On: 08/25/2019 18:39   DG C-Arm 1-60 Min-No Report  Result Date: 08/25/2019 CLINICAL DATA:  Right total hip replacement. EXAM: OPERATIVE RIGHT HIP (WITH PELVIS IF PERFORMED) TECHNIQUE: Fluoroscopic spot image(s) were submitted for interpretation post-operatively. COMPARISON:  Preoperative radiograph yesterday. FINDINGS: Four fluoroscopic spot views obtained in the operating room. Right femoral neck fracture with interval right hip arthroplasty. Total fluoroscopy time 7 seconds. Total dose 0.9 mGy. IMPRESSION: Procedural fluoroscopy during right hip arthroplasty. Electronically Signed   By: Keith Rake M.D.   On: 08/25/2019 18:40   DG HIP OPERATIVE UNILAT W OR W/O PELVIS RIGHT  Result Date: 08/25/2019 CLINICAL DATA:  Right total hip replacement. EXAM: OPERATIVE RIGHT HIP (WITH PELVIS IF PERFORMED) TECHNIQUE: Fluoroscopic spot image(s) were submitted for interpretation post-operatively. COMPARISON:  Preoperative radiograph yesterday. FINDINGS: Four fluoroscopic spot views obtained in the operating room. Right femoral neck fracture with interval right hip arthroplasty. Total fluoroscopy time 7 seconds. Total dose 0.9 mGy. IMPRESSION: Procedural fluoroscopy during right hip arthroplasty. Electronically Signed   By: Keith Rake M.D.   On: 08/25/2019 18:40        Scheduled Meds:  (feeding supplement) PROSource Plus  30 mL Oral Daily   amiodarone  100 mg Oral Daily   aspirin EC  325 mg Oral BID   atorvastatin  20 mg Oral Daily   carvedilol  12.5 mg Oral BID WC   celecoxib  200 mg Oral BID   Chlorhexidine Gluconate Cloth  6 each Topical Daily     docusate sodium  100 mg Oral BID   ezetimibe  5 mg Oral Daily   feeding supplement  237 mL Oral Q24H   ferrous sulfate  325 mg Oral TID PC   insulin aspart  0-15 Units Subcutaneous TID WC   insulin glargine  15 Units Subcutaneous QHS   multivitamin with minerals  1 tablet Oral Daily   mupirocin ointment  1 application Nasal BID   Continuous Infusions:  methocarbamol (ROBAXIN) IV       LOS: 2 days    Time spent: over 30 min    Fayrene Helper, MD [facility]   To contact the attending provider between 7A-7P or the covering provider during after hours 7P-7A, please log into the web site [email or web address] and access using universal [place] password for that web site. If you do not have the password, please call the hospital operator.  08/26/2019, 7:16 PM

## 2019-08-26 NOTE — NC FL2 (Addendum)
[place] LEVEL OF CARE SCREENING TOOL     IDENTIFICATION  Patient Name: Janet Owen Birthdate: 1933-07-14 Sex: female Admission Date (Current Location): 08/24/2019  [facility] [place] Number:  Herbalist and Address:  [facility],  [place], [place]      Provider Number: [id number]  Attending Physician Name and Address:  Elodia Florence., *  Relative Name and Phone Number:  [place], Chaidez [phone]  [phone]    Current Level of Care: Hospital Recommended Level of Care: [place] Prior Approval Number:    Date Approved/Denied:   PASRR Number:   [id number]  Discharge Plan: SNF    Current Diagnoses: Patient Active Problem List   Diagnosis Date Noted  . Hip fracture ([place]) 08/24/2019  . PAF (paroxysmal atrial fibrillation) ([place]) 08/24/2019  . DNR (do not resuscitate) 08/24/2019  . ICD BV  09/15/2015:  St. Jude Medical [place] model CD3357-40Q (serial  Number [id number]) in situ 07/23/2018  . Encounter for management of biventricular implantable cardioverter-defibrillator (ICD) 07/23/2018  . Type 2 diabetes mellitus with hyperglycemia ([place]) 08/31/2016  . CKD (chronic kidney disease) stage 3, GFR 30-59 ml/min 08/14/2016  . Mixed dyslipidemia 08/14/2016  . Vitamin D insufficiency 08/14/2016  . Typical atrial flutter ([place]) 07/30/2016  . Atrial flutter ([place]) 07/30/2016  . Chronic systolic heart failure ([place]) 11/11/2015  . CHF (congestive heart failure) ([place]) 09/15/2015  . Chronic systolic congestive heart failure ([place])   . Dyspnea 08/20/2014  . Abnormal CT scan, chest 08/20/2014  . Essential (primary) hypertension 08/20/2014  . Abnormal computed tomography scan 08/20/2014  . Encounter to establish care 08/09/2014  . Type 2 diabetes mellitus without complication ([place]) 62/95/2841    Orientation RESPIRATION BLADDER Height & Weight     Self, Time, Situation, Place  Normal  Continent Weight: 185 lb 6.5 oz (84.1 kg) Height:  5\' 7"  (170.2 cm)  BEHAVIORAL SYMPTOMS/MOOD NEUROLOGICAL BOWEL NUTRITION STATUS      Continent Diet  AMBULATORY STATUS COMMUNICATION OF NEEDS Skin   Extensive Assist Verbally Surgical wounds                       Personal Care Assistance Level of Assistance  Bathing, Feeding, Dressing Bathing Assistance: Maximum assistance Feeding assistance: Independent Dressing Assistance: Maximum assistance     Functional Limitations Info  Sight, Hearing, Speech Sight Info: Adequate Hearing Info: Adequate Speech Info: Adequate    SPECIAL CARE FACTORS FREQUENCY  PT (By licensed PT), OT (By licensed OT)     PT Frequency: 5x/week OT Frequency: 5x/week            Contractures Contractures Info: Not present    Additional Factors Info  Code Status, Allergies, Psychotropic, Insulin Sliding Scale Code Status Info: DNR Allergies Info: Allergies: Exenatide, Raloxifene   Insulin Sliding Scale Info: See medication list       Current Medications (08/26/2019):  This is the current hospital active medication list Current Facility-Administered Medications  Medication Dose Route Frequency Provider Last Rate Last Admin  . (feeding supplement) PROSource Plus liquid 30 mL  30 mL Oral Daily Caren Griffins, MD   30 mL at 08/25/19 2032  . acetaminophen (TYLENOL) tablet 325-650 mg  325-650 mg Oral Q6H PRN Danae Orleans, PA-C      . alum & mag hydroxide-simeth (MAALOX/MYLANTA) 200-200-20 MG/5ML suspension 30 mL  30 mL Oral Q4H PRN Danae Orleans, PA-C      . amiodarone (  PACERONE) tablet 100 mg  100 mg Oral Daily Kathie Dike, MD   100 mg at 08/26/19 1220  . aspirin EC tablet 325 mg  325 mg Oral BID Danae Orleans, PA-C   325 mg at 08/26/19 0800  . atorvastatin (LIPITOR) tablet 20 mg  20 mg Oral Daily Kathie Dike, MD   20 mg at 08/26/19 1220  . bisacodyl (DULCOLAX) suppository 10 mg  10 mg Rectal Daily PRN Danae Orleans, PA-C      .  carvedilol (COREG) tablet 12.5 mg  12.5 mg Oral BID WC Kathie Dike, MD   12.5 mg at 08/26/19 0804  . celecoxib (CELEBREX) capsule 200 mg  200 mg Oral BID Danae Orleans, PA-C   200 mg at 08/26/19 1221  . Chlorhexidine Gluconate Cloth 2 % PADS 6 each  6 each Topical Daily Paralee Cancel, MD   6 each at 08/25/19 1157  . docusate sodium (COLACE) capsule 100 mg  100 mg Oral BID Danae Orleans, PA-C   100 mg at 08/26/19 1219  . ezetimibe (ZETIA) tablet 5 mg  5 mg Oral Daily Kathie Dike, MD   5 mg at 08/26/19 1219  . feeding supplement (ENSURE SURGERY) liquid 237 mL  237 mL Oral Q24H Caren Griffins, MD   237 mL at 08/26/19 1304  . ferrous sulfate tablet 325 mg  325 mg Oral TID PC Danae Orleans, PA-C   325 mg at 08/26/19 0800  . HYDROcodone-acetaminophen (NORCO) 7.5-325 MG per tablet 1-2 tablet  1-2 tablet Oral Q4H PRN Danae Orleans, PA-C      . HYDROcodone-acetaminophen (NORCO/VICODIN) 5-325 MG per tablet 1-2 tablet  1-2 tablet Oral Q4H PRN Danae Orleans, PA-C   2 tablet at 08/26/19 0800  . HYDROmorphone (DILAUDID) injection 0.5-1 mg  0.5-1 mg Intravenous Q2H PRN Babish, Matthew, PA-C      . insulin aspart (novoLOG) injection 0-15 Units  0-15 Units Subcutaneous TID WC Elodia Florence., MD   2 Units at 08/26/19 1304  . insulin glargine (LANTUS) injection 15 Units  15 Units Subcutaneous QHS Kathie Dike, MD   15 Units at 08/25/19 2216  . magnesium citrate solution 1 Bottle  1 Bottle Oral Once PRN Babish, Matthew, PA-C      . menthol-cetylpyridinium (CEPACOL) lozenge 3 mg  1 lozenge Oral PRN Danae Orleans, PA-C       Or  . phenol (CHLORASEPTIC) mouth spray 1 spray  1 spray Mouth/Throat PRN Babish, Matthew, PA-C      . methocarbamol (ROBAXIN) tablet 500 mg  500 mg Oral Q6H PRN Kathie Dike, MD   500 mg at 08/25/19 2033   Or  . methocarbamol (ROBAXIN) 500 mg in dextrose 5 % 50 mL IVPB  500 mg Intravenous Q6H PRN Kathie Dike, MD      . metoCLOPramide (REGLAN) tablet 5-10 mg   5-10 mg Oral Q8H PRN Danae Orleans, PA-C       Or  . metoCLOPramide (REGLAN) injection 5-10 mg  5-10 mg Intravenous Q8H PRN Danae Orleans, PA-C   10 mg at 08/25/19 1843  . multivitamin with minerals tablet 1 tablet  1 tablet Oral Daily Caren Griffins, MD   1 tablet at 08/26/19 1220  . mupirocin ointment (BACTROBAN) 2 % 1 application  1 application Nasal BID Danae Orleans, PA-C   1 application at 54/65/68 2041  . ondansetron (ZOFRAN) tablet 4 mg  4 mg Oral Q6H PRN Danae Orleans, PA-C       Or  . ondansetron (  ZOFRAN) injection 4 mg  4 mg Intravenous Q6H PRN Danae Orleans, PA-C   4 mg at 08/26/19 1107  . polyethylene glycol (MIRALAX / GLYCOLAX) packet 17 g  17 g Oral Daily PRN Kathie Dike, MD         Discharge Medications: Please see discharge summary for a list of discharge medications.  Relevant Imaging Results:  Relevant Lab Results:   Additional Information SSN: [id number]  Lia Hopping, LCSW

## 2019-08-26 NOTE — TOC Progression Note (Signed)
Transition of Care [facility]) - Progression Note    Patient Details  Name: Janet Owen MRN: [id number] Date of Birth: Jun 06, 1933  Transition of Care [facility]) CM/SW [place], [place] Phone Number: 08/26/2019, 4:18 PM  Clinical Narrative:    [name] will accept the patient in am, if medically stable to transfer. Patient agreeable to pay extra for private room.  Physician notified to order a covid test.  Patient request [facility] to transport.   Expected Discharge Plan: [place] Barriers to Discharge: No Barriers Identified  Expected Discharge Plan and Services Expected Discharge Plan: [place] In-house Referral: Clinical Social Work Discharge Planning Services: CM Consult Post Acute Care Choice: [place] arrangements for the past 2 months: Single Family Home                 DME Arranged: N/A DME Agency: NA       HH Arranged: NA HH Agency: NA         Social Determinants of Health (SDOH) Interventions    Readmission Risk Interventions No flowsheet data found.

## 2019-08-26 NOTE — Progress Notes (Addendum)
Physical Therapy Treatment Patient Details Name: Janet Owen MRN: [id number] DOB: 1933-05-07 Today's Date: 08/26/2019    History of Present Illness 86 yo female admitted with hip fracture after falling at home. S/P R THA-direct anterior 08/25/19. Hx of Afib, DM, CHF, CKD, ICD.    PT Comments    Progressing very slowly. High fall risk. Only able to ambulate ~5 feet again this afternoon. Discussed d/c plan again-pt is open to ST SNF for continued rehab if necessary. Will continue to follow and progress activity as tolerated.     Follow Up Recommendations  SNF(unless pt progresses significantly)     Equipment Recommendations   (TBD at next venue)    Recommendations for Other Services       Precautions / Restrictions Precautions Precautions: Fall Restrictions Weight Bearing Restrictions: No Other Position/Activity Restrictions: WBAT    Mobility  Bed Mobility Overal bed mobility: Needs Assistance Bed Mobility: Sit to Supine     Supine to sit: Min assist;HOB elevated Sit to supine: Min assist   General bed mobility comments: Assist for R LE and some assistance for trunk. Encouraged pt to do as much as she could herself. Increased time.  Transfers Overall transfer level: Needs assistance Equipment used: Rolling walker (2 wheeled) Transfers: Sit to/from [facility] Sit to Stand: Mod assist Stand pivot transfers: Min assist       General transfer comment: Multiple attempts and increased time to get to standing. Max VCs safety, technique, placement of hands/feet. Increased time for pt to gain static standing balance.  Ambulation/Gait Ambulation/Gait assistance: Min assist;+2 safety/equipment Gait Distance (Feet): 5 Feet Assistive device: Rolling walker (2 wheeled) Gait Pattern/deviations: Step-to pattern;Antalgic     General Gait Details: Max VCs safety, technique, sequence, step length, distance from RW. Mod encouragement required to try to  progress distance. Distance limited by fatigue, weakness and some fearfulness. Followed closely with recliner   Stairs             Wheelchair Mobility    Modified Rankin (Stroke Patients Only)       Balance Overall balance assessment: Needs assistance;History of Falls         Standing balance support: Bilateral upper extremity supported Standing balance-Leahy Scale: Poor                              Cognition Arousal/Alertness: Awake/alert Behavior During Therapy: Anxious Overall Cognitive Status: Within Functional Limits for tasks assessed                                 General Comments: fearful of falling      Exercises Total Joint Exercises Ankle Circles/Pumps: AROM;Both;10 reps Quad Sets: AROM;Both;10 reps Heel Slides: AAROM;Right;10 reps Hip ABduction/ADduction: AAROM;Right;10 reps    General Comments        Pertinent Vitals/Pain Pain Assessment: 0-10 Pain Score: 5  Pain Location: R hip Pain Descriptors / Indicators: Discomfort;Sore Pain Intervention(s): Limited activity within patient's tolerance;Monitored during session;Repositioned    Home Living                      Prior Function            PT Goals (current goals can now be found in the care plan section) Acute Rehab PT Goals Patient Stated Goal: home PT Goal Formulation: With patient Time For Goal Achievement: 09/09/19  Potential to Achieve Goals: Good Progress towards PT goals: Progressing toward goals    Frequency    7X/week      PT Plan      Co-evaluation              AM-PAC PT "6 Clicks" Mobility   Outcome Measure  Help needed turning from your back to your side while in a flat bed without using bedrails?: A Little Help needed moving from lying on your back to sitting on the side of a flat bed without using bedrails?: A Little Help needed moving to and from a bed to a chair (including a wheelchair)?: A Lot Help needed  standing up from a chair using your arms (e.g., wheelchair or bedside chair)?: A Lot Help needed to walk in hospital room?: A Lot Help needed climbing 3-5 steps with a railing? : Total 6 Click Score: 13    End of Session Equipment Utilized During Treatment: Gait belt Activity Tolerance: Patient limited by fatigue Patient left: in bed;with call bell/phone within reach;with bed alarm set   PT Visit Diagnosis: Muscle weakness (generalized) (M62.81);History of falling (Z91.81);Difficulty in walking, not elsewhere classified (R26.2)     Time: 9030-0923 PT Time Calculation (min) (ACUTE ONLY): 30 min  Charges:  $Gait Training: 23-37 mins                         Doreatha Massed, PT Acute Rehabilitation  Office: [phone] Pager: [phone]

## 2019-08-26 NOTE — Progress Notes (Addendum)
     Subjective: 1 Day Post-Op Procedure(s) (LRB): TOTAL HIP ARTHROPLASTY ANTERIOR APPROACH (Right)   Patient reports pain as mild, pain controlled. No reported events throughout the night.  Dr. Alvan Dame discussed the procedure, findings and expectations moving forward.   Follow up in the clinic in 2 weeks.  Knows to call with any questions or concerns.       Objective:   VITALS:   Vitals:   08/26/19 0604 08/26/19 0806  BP: (!) 145/84 (!) 158/73  Pulse: 66 70  Resp: 16   Temp: 98.2 F (36.8 C)   SpO2: 93% 98%    Dorsiflexion/Plantar flexion intact Incision: dressing C/D/I No cellulitis present Compartment soft  LABS Recent Labs    08/24/19 1241 08/25/19 0319 08/26/19 0340  HGB 15.3* 14.7 13.0  HCT 48.0* 46.0 40.5  WBC 9.1 10.6* 8.0  PLT 126* 125* 101*    Recent Labs    08/24/19 1241 08/25/19 0319 08/26/19 0340  NA 140 137 137  K 4.5 4.1 5.1  BUN 34* 32* 34*  CREATININE 1.72* 1.72* 1.56*  GLUCOSE 170* 188* 215*     Assessment/Plan: 1 Day Post-Op Procedure(s) (LRB): TOTAL HIP ARTHROPLASTY ANTERIOR APPROACH (Right) Advance diet Up with therapy Discharge when ready medically   Ortho recommendations:  ASA 81 mg bid for 4 weeks for anticoagulation, unless other medically indicated.  Norco for pain management (Rx written).  MiraLax and Colace for constipation  Iron 325 mg tid for 2-3 weeks   WBAT on the right leg.  Dressing to remain in place until follow in clinic in 2 weeks.  Dressing is waterproof and may shower with it in place.  Follow up in 2 weeks at [facility]. Follow up with OLIN,Ardit Danh D in 2 weeks.  Contact information:  [facility] [address], [address] [place] [phone]             Danae Orleans PA-C  [facility]  [place] [address], [place], [place], [place] [address] Phone: [phone] [email or web address] Facebook   [employer]

## 2019-08-26 NOTE — TOC Initial Note (Signed)
Transition of Care [facility]) - Initial/Assessment Note    Patient Details  Name: Janet Owen MRN: [id number] Date of Birth: Jun 20, 1933  [facility]) CM/SW Contact:    Lia Hopping, [place] Phone Number: 08/26/2019, 1:49 PM  Clinical Narrative:                 CSW met with the patient at bedside to discuss rehab placement to SNF. Patient tearful upon csw arrival. She reports, "I did not do well with therapy this morning. I am not sure if I can go home and I do not want to go a facility, if I don't have to." Patient reports she had two knee replacements done in the past and went home with therapy. Patient explain, she was very dizzy when she worked with therapy and did not go very far. Patient inquired about SNF process. CSW explain the SNF process. Patient and her husband plan to move in a home on [address] in the spring. Patient is agreeable to rehab at [facility] if a bed is available.  CSW faxed pt. Information other facilities in the area as well.    CSW reached out to [place] [facility]. Patient information under review.  FL2 completed.  Patient reports she has been vaccinated.   Expected Discharge Plan: Skilled Nursing Facility Barriers to Discharge: No Barriers Identified   Patient Goals and CMS Choice Patient states their goals for this hospitalization and ongoing recovery are:: Go to rehab or home depends on how I do with therapy. CMS Medicare.gov Compare Post Acute Care list provided to:: Patient Choice offered to / list presented to : Patient  Expected Discharge Plan and Services Expected Discharge Plan: [place] In-house Referral: Clinical Social Work Discharge Planning Services: CM Consult Post Acute Care Choice: [place] arrangements for the past 2 months: Single Family Home                 DME Arranged: N/A DME Agency: NA       HH Arranged: NA [place] Agency: NA        Prior Living  Arrangements/Services Living arrangements for the past 2 months: [place] Lives with:: Spouse Patient language and need for interpreter reviewed:: No Do you feel safe going back to the place where you live?: No      Need for Family Participation in Patient Care: Yes (Comment) Care giver support system in place?: Yes (comment)   Criminal Activity/Legal Involvement Pertinent to Current Situation/Hospitalization: No - Comment as needed  Activities of Daily Living Home Assistive Devices/Equipment: CBG Meter, Eyeglasses ADL Screening (condition at time of admission) Patient's cognitive ability adequate to safely complete daily activities?: Yes Is the patient deaf or have difficulty hearing?: No Does the patient have difficulty seeing, even when wearing glasses/contacts?: No Does the patient have difficulty concentrating, remembering, or making decisions?: No Patient able to express need for assistance with ADLs?: Yes Does the patient have difficulty dressing or bathing?: No Independently performs ADLs?: No Communication: Independent Dressing (OT): Needs assistance Is this a change from baseline?: Change from baseline, expected to last >3 days Grooming: Needs assistance Is this a change from baseline?: Change from baseline, expected to last >3 days Feeding: Needs assistance Is this a change from baseline?: Change from baseline, expected to last >3 days Bathing: Needs assistance Is this a change from baseline?: Change from baseline, expected to last >3 days Toileting: Dependent Is this a change from baseline?: Change from  baseline, expected to last >3days In/Out Bed: Dependent Is this a change from baseline?: Change from baseline, expected to last >3 days Walks in Home: Dependent Is this a change from baseline?: Change from baseline, expected to last >3 days Does the patient have difficulty walking or climbing stairs?: Yes Weakness of Legs: Right Weakness of Arms/Hands:  None  Permission Sought/Granted Permission sought to share information with : Facility Art therapist granted to share information with : Yes, Verbal Permission Granted     Permission granted to share info w AGENCY: SNF's in the area  Permission granted to share info w Relationship: Spouse     Emotional Assessment Appearance:: Appears stated age Attitude/Demeanor/Rapport: Engaged Affect (typically observed): Tearful/Crying, Accepting Orientation: : Oriented to Self, Oriented to Place, Oriented to  Time, Oriented to Situation Alcohol / Substance Use: Not Applicable Psych Involvement: No (comment)  Admission diagnosis:  Hip fracture ([place]) [S72.009A] Pain [R52] Closed fracture of right hip, initial encounter ([place]) [S72.001A] Patient Active Problem List   Diagnosis Date Noted   Hip fracture ([place]) 08/24/2019   PAF (paroxysmal atrial fibrillation) ([place]) 08/24/2019   DNR (do not resuscitate) 08/24/2019   ICD BV  09/15/2015:  [place] model [phone] (serial  Number [id number]) in situ 07/23/2018   Encounter for management of biventricular implantable cardioverter-defibrillator (ICD) 07/23/2018   Type 2 diabetes mellitus with hyperglycemia ([place]) 08/31/2016   CKD (chronic kidney disease) stage 3, GFR 30-59 ml/min 08/14/2016   Mixed dyslipidemia 08/14/2016   Vitamin D insufficiency 08/14/2016   Typical atrial flutter ([place]) 07/30/2016   Atrial flutter ([place]) 03/52/4818   Chronic systolic heart failure ([place]) 11/11/2015   CHF (congestive heart failure) ([place]) 59/10/3110   Chronic systolic congestive heart failure ([place])    Dyspnea 08/20/2014   Abnormal CT scan, chest 08/20/2014   Essential (primary) hypertension 08/20/2014   Abnormal computed tomography scan 08/20/2014   Encounter to establish care 08/09/2014   Type 2 diabetes mellitus without complication ([place]) 16/24/4695   PCP:  Bartholome Bill, MD Pharmacy:   [facility] [phone] [address], [place] [address] AT [facility] [address] [place] [address] Phone: [phone] Fax: [phone]     Social Determinants of Health (SDOH) Interventions    Readmission Risk Interventions No flowsheet data found.

## 2019-08-26 NOTE — Evaluation (Signed)
Physical Therapy Evaluation Patient Details Name: Janet Owen MRN: [id number] DOB: 1934/02/06 Today's Date: 08/26/2019   History of Present Illness  86 yo female admitted with hip fracture after falling at home. S/P R THA-direct anterior 08/25/19. Hx of Afib, DM, CHF, CKD, ICD.  Clinical Impression  On eval, pt required Min-Mod assist +2 for safety/equipment. She was only able to walk ~5 feet on this morning. Pt suddenly became weak and c/o feeling hot before she abruptly sat down in recliner. BP was 139/65. Deferred any further ambulation. Pt would like to return home at discharge. Husband can help some but he is recovering from recent foot sg. Will continue follow and progress activity as tolerated.     Follow Up Recommendations SNF vs Home health PT;Supervision/Assistance - 24 hour (depending on progress)    Equipment Recommendations  Rolling walker with 5" wheels    Recommendations for Other Services       Precautions / Restrictions Precautions Precautions: Fall Restrictions Weight Bearing Restrictions: No Other Position/Activity Restrictions: WBAT      Mobility  Bed Mobility Overal bed mobility: Needs Assistance Bed Mobility: Supine to Sit     Supine to sit: Min assist;HOB elevated     General bed mobility comments: Assist for R LE and some assistance for trunk. Encouraged pt to do as much as she could herself. Increased time.  Transfers Overall transfer level: Needs assistance Equipment used: Rolling walker (2 wheeled) Transfers: Sit to/from Stand Sit to Stand: Mod assist;From elevated surface         General transfer comment: Multiple attempts to get to standing. Max VCs safety, technique, hand placement. Increased time.  Ambulation/Gait Ambulation/Gait assistance: Min assist;+2 safety/equipment Gait Distance (Feet): 5 Feet Assistive device: Rolling walker (2 wheeled) Gait Pattern/deviations: Step-to pattern;Trunk flexed;Antalgic     General Gait  Details: Max VCs safety, technique, sequence, step length, distance from RW. Mod encouragement required. After 5 feet, pt suddenly became weak and sat abruptly down in recliner. Pt c/o feeling weak, hot.  BP 139/65  Stairs            Wheelchair Mobility    Modified Rankin (Stroke Patients Only)       Balance Overall balance assessment: Needs assistance;History of Falls         Standing balance support: Bilateral upper extremity supported Standing balance-Leahy Scale: Poor                               Pertinent Vitals/Pain Pain Assessment: 0-10 Pain Score: 5  Pain Location: R hip Pain Descriptors / Indicators: Discomfort;Sore Pain Intervention(s): Limited activity within patient's tolerance;Monitored during session;Repositioned    Home Living Family/patient expects to be discharged to:: Private residence Living Arrangements: Spouse/significant other Available Help at Discharge: Family Type of Home: House Home Access: Stairs to enter Entrance Stairs-Rails: Right Entrance Stairs-Number of Steps: 2 Home Layout: Able to live on main level with bedroom/bathroom Home Equipment: Gilford Rile - 2 wheels;Crutches      Prior Function Level of Independence: Independent               Hand Dominance        Extremity/Trunk Assessment   Upper Extremity Assessment Upper Extremity Assessment: Defer to OT evaluation    Lower Extremity Assessment Lower Extremity Assessment: Generalized weakness    Cervical / Trunk Assessment Cervical / Trunk Assessment: Normal  Communication   Communication: No difficulties  Cognition Arousal/Alertness: Awake/alert Behavior  During Therapy: Anxious Overall Cognitive Status: Within Functional Limits for tasks assessed                                        General Comments      Exercises Total Joint Exercises Ankle Circles/Pumps: AROM;Both;10 reps Quad Sets: AROM;Both;10 reps Heel Slides:  AAROM;Right;10 reps Hip ABduction/ADduction: AAROM;Right;10 reps   Assessment/Plan    PT Assessment Patient needs continued PT services  PT Problem List Decreased strength;Decreased mobility;Decreased range of motion;Decreased activity tolerance;Decreased balance;Decreased knowledge of use of DME;Pain       PT Treatment Interventions DME instruction;Gait training;Therapeutic activities;Therapeutic exercise;Patient/family education;Functional mobility training;Balance training    PT Goals (Current goals can be found in the Care Plan section)  Acute Rehab PT Goals Patient Stated Goal: home PT Goal Formulation: With patient Time For Goal Achievement: 09/09/19 Potential to Achieve Goals: Good    Frequency 7X/week   Barriers to discharge        Co-evaluation               AM-PAC PT "6 Clicks" Mobility  Outcome Measure Help needed turning from your back to your side while in a flat bed without using bedrails?: A Little Help needed moving from lying on your back to sitting on the side of a flat bed without using bedrails?: A Little Help needed moving to and from a bed to a chair (including a wheelchair)?: A Lot Help needed standing up from a chair using your arms (e.g., wheelchair or bedside chair)?: A Lot Help needed to walk in hospital room?: A Lot Help needed climbing 3-5 steps with a railing? : A Lot 6 Click Score: 14    End of Session Equipment Utilized During Treatment: Gait belt Activity Tolerance: Patient limited by fatigue Patient left: in chair;with call bell/phone within reach   PT Visit Diagnosis: Muscle weakness (generalized) (M62.81);History of falling (Z91.81);Difficulty in walking, not elsewhere classified (R26.2)    Time: 3729-0211 PT Time Calculation (min) (ACUTE ONLY): 38 min   Charges:   PT Evaluation $PT Eval Low Complexity: 1 Low PT Treatments $Gait Training: 8-22 mins          Doreatha Massed, PT Acute Rehabilitation  Office:  [phone] Pager: [phone]

## 2019-08-27 LAB — CBC
HCT: 37.5 % (ref 36.0–46.0)
Hemoglobin: 12 g/dL (ref 12.0–15.0)
MCH: 29.3 pg (ref 26.0–34.0)
MCHC: 32 g/dL (ref 30.0–36.0)
MCV: 91.5 fL (ref 80.0–100.0)
Platelets: 85 10*3/uL — ABNORMAL LOW (ref 150–400)
RBC: 4.1 MIL/uL (ref 3.87–5.11)
RDW: 13.2 % (ref 11.5–15.5)
WBC: 8.6 10*3/uL (ref 4.0–10.5)
nRBC: 0 % (ref 0.0–0.2)

## 2019-08-27 LAB — BASIC METABOLIC PANEL
Anion gap: 11 (ref 5–15)
BUN: 43 mg/dL — ABNORMAL HIGH (ref 8–23)
CO2: 23 mmol/L (ref 22–32)
Calcium: 9.5 mg/dL (ref 8.9–10.3)
Chloride: 102 mmol/L (ref 98–111)
Creatinine, Ser: 1.63 mg/dL — ABNORMAL HIGH (ref 0.44–1.00)
GFR calc Af Amer: 33 mL/min — ABNORMAL LOW (ref >60)
GFR calc non Af Amer: 29 mL/min — ABNORMAL LOW (ref >60)
Glucose, Bld: 159 mg/dL — ABNORMAL HIGH (ref 70–99)
Potassium: 4.5 mmol/L (ref 3.5–5.1)
Sodium: 136 mmol/L (ref 135–145)

## 2019-08-27 LAB — GLUCOSE, CAPILLARY
Glucose-Capillary: 158 mg/dL — ABNORMAL HIGH (ref 70–99)
Glucose-Capillary: 182 mg/dL — ABNORMAL HIGH (ref 70–99)
Glucose-Capillary: 183 mg/dL — ABNORMAL HIGH (ref 70–99)

## 2019-08-27 MED ORDER — JANUVIA 50 MG PO TABS: 25.0000 mg | ORAL_TABLET | Freq: Every day | ORAL | 0 refills | Status: DC

## 2019-08-27 MED ORDER — FERROUS SULFATE 325 (65 FE) MG PO TABS: 325.0000 mg | ORAL_TABLET | Freq: Three times a day (TID) | ORAL | 0 refills | Status: DC

## 2019-08-27 MED ORDER — DOCUSATE SODIUM 100 MG PO CAPS: 100.0000 mg | ORAL_CAPSULE | Freq: Two times a day (BID) | ORAL | 0 refills | Status: AC

## 2019-08-27 MED ORDER — TOUJEO SOLOSTAR 300 UNIT/ML ~~LOC~~ SOPN: 15.0000 [IU] | PEN_INJECTOR | Freq: Every day | SUBCUTANEOUS | 0 refills | Status: AC

## 2019-08-27 MED ORDER — ASPIRIN 325 MG PO TBEC: 325.0000 mg | DELAYED_RELEASE_TABLET | Freq: Two times a day (BID) | ORAL | 0 refills | Status: AC

## 2019-08-27 MED ORDER — POLYETHYLENE GLYCOL 3350 17 G PO PACK: 17.0000 g | PACK | Freq: Every day | ORAL | 0 refills | Status: DC

## 2019-08-27 NOTE — Progress Notes (Signed)
Physical Therapy Treatment Patient Details Name: Janet Owen MRN: [id number] DOB: 1933-07-24 Today's Date: 08/27/2019    History of Present Illness 86 yo female admitted with hip fracture after falling at home. S/P R THA-direct anterior 08/25/19. Hx of Afib, DM, CHF, CKD, ICD.    PT Comments    The patient had much difficulty standing from recliner, required 2 assisting and stedy= to get to [facility] then to bed. Continue PT for mobility.  Follow Up Recommendations  SNF     Equipment Recommendations  None recommended by PT    Recommendations for Other Services       Precautions / Restrictions Precautions Precautions: Fall Restrictions Other Position/Activity Restrictions: WBAT    Mobility  Bed Mobility   Bed Mobility: Sit to Supine       Sit to supine: Mod assist   General bed mobility comments: assist for legs back onto bed.  Transfers Overall transfer level: Needs assistance Equipment used: Rolling walker (2 wheeled) Transfers: Sit to/from [facility] Sit to Stand: Max assist;+2 physical assistance;+2 safety/equipment Stand pivot transfers: Total assist       General transfer comment: multiple attempts to stand from recliner without success. Stedy provided and assisted with  2 total assist to rise. Flaps could not be dropped so patient supported in standing to trnasfer to [facility], Same assistance to stand with 2 max from [facility] , then back to bed with stedy while remained standing.  Ambulation/Gait                 Stairs             Wheelchair Mobility    Modified Rankin (Stroke Patients Only)       Balance           Standing balance support: Bilateral upper extremity supported Standing balance-Leahy Scale: Poor                              Cognition Arousal/Alertness: Awake/alert Behavior During Therapy: Anxious Overall Cognitive Status: Within Functional Limits for tasks assessed                                  General Comments: fearful of falling      Exercises      General Comments        Pertinent Vitals/Pain Pain Location: R hip Pain Descriptors / Indicators: Discomfort;Sore Pain Intervention(s): Premedicated before session;Monitored during session;Repositioned    Home Living                      Prior Function            PT Goals (current goals can now be found in the care plan section) Progress towards PT goals: Progressing toward goals    Frequency    Min 3X/week      PT Plan Current plan remains appropriate;Frequency needs to be updated    Co-evaluation              AM-PAC PT "6 Clicks" Mobility   Outcome Measure  Help needed turning from your back to your side while in a flat bed without using bedrails?: A Lot Help needed moving from lying on your back to sitting on the side of a flat bed without using bedrails?: A Lot Help needed moving to and from a bed to a  chair (including a wheelchair)?: A Lot Help needed standing up from a chair using your arms (e.g., wheelchair or bedside chair)?: A Lot Help needed to walk in hospital room?: Total Help needed climbing 3-5 steps with a railing? : Total 6 Click Score: 10    End of Session Equipment Utilized During Treatment: Gait belt Activity Tolerance:  (limited by anxuiousnwss) Patient left: in bed;with call bell/phone within reach;with bed alarm set Nurse Communication: Mobility status PT Visit Diagnosis: Muscle weakness (generalized) (M62.81);History of falling (Z91.81);Difficulty in walking, not elsewhere classified (R26.2)     Time: 5051-8335 PT Time Calculation (min) (ACUTE ONLY): 25 min  Charges:  $Therapeutic Activity: 23-37 mins                     Tresa Endo PT Acute Rehabilitation Services Pager [phone] Office [phone]    Claretha Cooper 08/27/2019, 12:43 PM

## 2019-08-27 NOTE — Plan of Care (Signed)
  Problem: Education: Goal: Knowledge of General Education information will improve Description: Including pain rating scale, medication(s)/side effects and non-pharmacologic comfort measures 08/27/2019 1555 by Hubert Azure, RN Outcome: Adequate for Discharge 08/27/2019 1555 by Hubert Azure, RN Outcome: Progressing   Problem: Health Behavior/Discharge Planning: Goal: Ability to manage health-related needs will improve 08/27/2019 1555 by Hubert Azure, RN Outcome: Adequate for Discharge 08/27/2019 1555 by Hubert Azure, RN Outcome: Progressing   Problem: Clinical Measurements: Goal: Ability to maintain clinical measurements within normal limits will improve 08/27/2019 1555 by Hubert Azure, RN Outcome: Adequate for Discharge 08/27/2019 1555 by Hubert Azure, RN Outcome: Progressing Goal: Will remain free from infection 08/27/2019 1555 by Hubert Azure, RN Outcome: Adequate for Discharge 08/27/2019 1555 by Hubert Azure, RN Outcome: Progressing Goal: Diagnostic test results will improve 08/27/2019 1555 by Hubert Azure, RN Outcome: Adequate for Discharge 08/27/2019 1555 by Hubert Azure, RN Outcome: Progressing Goal: Cardiovascular complication will be avoided 08/27/2019 1555 by Hubert Azure, RN Outcome: Adequate for Discharge 08/27/2019 1555 by Hubert Azure, RN Outcome: Progressing   Problem: Activity: Goal: Risk for activity intolerance will decrease Outcome: Adequate for Discharge   Problem: Nutrition: Goal: Adequate nutrition will be maintained Outcome: Adequate for Discharge   Problem: Coping: Goal: Level of anxiety will decrease Outcome: Adequate for Discharge   Problem: Elimination: Goal: Will not experience complications related to bowel motility Outcome: Adequate for Discharge Goal: Will not experience complications related to urinary retention Outcome: Adequate for Discharge   Problem: Pain Managment: Goal: General experience of comfort  will improve Outcome: Adequate for Discharge   Problem: Safety: Goal: Ability to remain free from injury will improve Outcome: Adequate for Discharge   Problem: Skin Integrity: Goal: Risk for impaired skin integrity will decrease Outcome: Adequate for Discharge

## 2019-08-27 NOTE — Plan of Care (Signed)

## 2019-08-27 NOTE — Progress Notes (Signed)
Gave report to Tollie Eth RN at [facility].

## 2019-08-27 NOTE — TOC Transition Note (Signed)
Transition of Care [facility]) - CM/SW Discharge Note   Patient Details  Name: Janet Owen MRN: [id number] Date of Birth: [birth year]  [facility] CM/SW Contact:  Lennart Pall, LCSW Phone Number: 08/27/2019, 2:40 PM   Clinical Narrative:   Pt medically cleared for transfer to [facility] SNF today. Ambulance called.  No further TOC needs.    Final next level of care: Skilled Nursing Facility Barriers to Discharge: Barriers Resolved   Patient Goals and CMS Choice Patient states their goals for this hospitalization and ongoing recovery are:: Go to rehab or home depends on how I do with therapy. CMS Medicare.gov Compare Post Acute Care list provided to:: Patient Choice offered to / list presented to : Patient  Discharge Placement              Patient [facility] bed at: [facility] Patient to be transferred to facility by: [place] Name of family member notified: spouse Patient and family notified of of transfer: 08/27/19  Discharge Plan and Services In-house Referral: Clinical Social Work Discharge Planning Services: [employer] Consult Post Acute Care Choice: [place]          DME Arranged: N/A DME Agency: NA       HH Arranged: NA HH Agency: NA        Social Determinants of Health (SDOH) Interventions     Readmission Risk Interventions No flowsheet data found.

## 2019-08-27 NOTE — Care Management Important Message (Signed)
Important Message  Patient Details IM Letter given to Lennart Pall SW Case Manger to present to the Patient Name: Janet Owen MRN: [id number] Date of Birth: 1933-12-14   Medicare Important Message Given:  Yes     Kerin Salen 08/27/2019, 12:46 PM

## 2019-08-27 NOTE — Discharge Summary (Signed)
Physician Discharge Summary  Janet Owen IWL:[id number] DOB: 06-Apr-1933 DOA: 08/24/2019  PCP: Bartholome Bill, MD  Admit date: 08/24/2019 Discharge date: 08/27/2019  Time spent: 40 minutes  Recommendations for Outpatient Follow-up:  1. Follow outpatient CBC/CMP in 1 day 2. Continue aspirin for DVT ppx (if platelets below 50,000, will need to hold aspirin) 3. Follow platelets outpatient (follow repeat labs tomorrow) - if platelets not improving or continuing to downtrend, will need follow up with hematology outpatient 4.  weight bearing as tolerated to Right leg, dressing to remain in place until f/u in 2 weeks - follow up with [facility] in 2 weeks 5. toujeo reduced to 15 units, follow blood sugars on reduced dose - januvia reduced to 25 mg daily with renal function - follow ACHS blood sugars and adjust diabetes regimen as needed  Discharge Diagnoses:  Principal Problem:   Hip fracture ([place]) Active Problems:   Essential (primary) hypertension   Chronic systolic congestive heart failure (HCC)   Typical atrial flutter (HCC)   CKD (chronic kidney disease) stage 3, GFR 30-59 ml/min   Mixed dyslipidemia   Type 2 diabetes mellitus with hyperglycemia ([place])   ICD BV  09/15/2015:  [employer] [place] model [phone] (serial  Number [phone]) in situ   PAF (paroxysmal atrial fibrillation) (HCC)   DNR (do not resuscitate)   Discharge Condition: stable  Diet recommendation: heart healthy  Filed Weights   08/24/19 1512  Weight: 84.1 kg    History of present illness:  86 year old female withNICM with EF35%with biventricular ICD, Eriko Economos. fib status post ablation, hypertension, hyperlipidemia, diabetes, who was in her usual state of health when she was working in her house and lost her footing and fell onto the right hip. Patient recalls the event, denies any chest pain, lightheadedness or dizziness or syncopal episodes. She was unable to stand after the fall,  she was brought to the hospital found to have right hip fracture. Orthopedic surgery consulted and plan is for surgery today.  She was admitted with Olivine Hiers right hip fracture and is now s/p right total hip replacement with orthopedics.  She's stable for discharge on 7/15 to rehab.  Hospital Course:  Right-sided hip fracture S/p R total hip replacement 7/13 ASA BID x 4 weeks for DVT ppx - follow platelets on ASA, if platelets below 50,000, ASA should be held Pain management, bowel regimen WBAT on R leg Dressing to remain in place until f/u in clinic in 2 weeks PT/OT - lightheaded with therapy today, follow orthostatics - negative orthostatics - therapy recommending SNF  Chronic systolic CHF-ejection fraction 35% per Dr. [name] recent not. currently appears compensated.  Resume lasix  Atrial flutter status post ablation-during ablation procedure she had transient Malena Timpone. fib for Kion Huntsberry few minutes, previously on anticoagulation. Admitting MD discussed with Dr. Johney Maine felt it was reasonable not to continue any further anticoagulation at this point since her Shaheen Mende. fib was very transient and she was maintaining sinus rhythm since her ablation. Continue amiodarone and coreg.  Chronic kidney disease stage IIIb-Baseline creatinine 1.5 in 2018. Relatively stable at discharge  Insulin-dependent diabetes mellitus, A1c 7.6, continue toujeo at 15 units (lower dose) at SNF.  Continue starlix and januvia (reduce to 25 mg with renal function).  Follow blood sugars at SNF.  Hyperlipidemia-continue atorvastatin  Essential hypertension-continue Coreg, blood pressure normal this morning.  Thrombocytopenia: continue to monitor - follow outpatient - if persistent or worsening, please follow up with heme outpatient   Procedures: 7/13  Right total hip replacement through an anterior approach   utilizing DePuy THR system, component size 50 mm pinnacle cup, Vaishnavi Dalby size 32+4 neutral   Altrex liner, Arianna Haydon size 5 Hi Actis  stem with Kalena Mander 32+1 Articuleze metal head ball.   Consultations:  orthopedics  Discharge Exam: Vitals:   08/27/19 0903 08/27/19 1045  BP: 118/72 (!) 146/78  Pulse: 66 70  Resp:    Temp:    SpO2: 99%    No new complaints Did ok with therapy yesterday Kayman Snuffer bit nervous about discharge  General: No acute distress. Cardiovascular: Heart sounds show Mehtaab Mayeda regular rate, and rhythm Lungs: Clear to auscultation bilaterally  Abdomen: Soft, nontender, nondistended Neurological: Alert and oriented 3. Moves all extremities 4 . Cranial nerves II through XII grossly intact. Skin: Warm and dry. No rashes or lesions. Extremities: RLE with dressing in place   Discharge Instructions   Discharge Instructions    Call MD for:  difficulty breathing, headache or visual disturbances   Complete by: As directed    Call MD for:  extreme fatigue   Complete by: As directed    Call MD for:  hives   Complete by: As directed    Call MD for:  persistant dizziness or light-headedness   Complete by: As directed    Call MD for:  persistant nausea and vomiting   Complete by: As directed    Call MD for:  redness, tenderness, or signs of infection (pain, swelling, redness, odor or green/yellow discharge around incision site)   Complete by: As directed    Call MD for:  severe uncontrolled pain   Complete by: As directed    Call MD for:  temperature >100.4   Complete by: As directed    Diet - low sodium heart healthy   Complete by: As directed    Discharge instructions   Complete by: As directed    You were seen for Nygeria Lager right hip fracture.   This has now been surgically repaired by orthopedics.   You'll have rehab at the skilled nursing facility.  Your platelets are low, please follow up repeat labs at the skilled nursing facility (I'll write orders for this).  You should follow up with your outpatient provider about this and if they do not improve you may need to see hematology.   Continue aspirin twice  daily to help prevent DVT's (if your platelets drop below 50,000, this should be held).  Continue iron post op.  The dressing should remain in place until you follow up in clinic.  Please follow up with orthopedics in 2 weeks.  Return for new, recurrent, or worsening symptoms.  Please ask your PCP to request records from this hospitalization so they know what was done and what the next steps will be.   Increase activity slowly   Complete by: As directed    Leave dressing on - Keep it clean, dry, and intact until clinic visit   Complete by: As directed    Weight bearing as tolerated   Complete by: As directed    Laterality: right   Extremity: Lower     Allergies as of 08/27/2019      Reactions   Exenatide Nausea Only   Dizziness   Raloxifene Other (See Comments)   hot flashes      Medication List    TAKE these medications   amiodarone 200 MG tablet Commonly known as: PACERONE TAKE 1/2 TABLET BY MOUTH EVERY DAY   aspirin 325 MG  EC tablet Take 1 tablet (325 mg total) by mouth 2 (two) times daily for 28 days.   atorvastatin 20 MG tablet Commonly known as: LIPITOR Take 20 mg by mouth daily.   B-D UF III MINI PEN NEEDLES 31G X 5 MM Misc Generic drug: Insulin Pen Needle Use to inject Toujeo once daily.   carvedilol 12.5 MG tablet Commonly known as: COREG TAKE 1 TABLET BY MOUTH TWICE DAILY What changed: when to take this   diphenhydramine-acetaminophen 25-500 MG Tabs tablet Commonly known as: TYLENOL PM Take 2 tablets by mouth at bedtime.   docusate sodium 100 MG capsule Commonly known as: COLACE Take 1 capsule (100 mg total) by mouth 2 (two) times daily.   ezetimibe 10 MG tablet Commonly known as: ZETIA Take one-half tablet (5 mg) by mouth daily   ferrous sulfate 325 (65 FE) MG tablet Take 1 tablet (325 mg total) by mouth 3 (three) times daily after meals.   furosemide 40 MG tablet Commonly known as: LASIX TAKE 1 TABLET BY MOUTH DAILY    HYDROcodone-acetaminophen 5-325 MG tablet Commonly known as: Norco Take 1-2 tablets by mouth every 6 (six) hours as needed for moderate pain or severe pain.   Januvia 50 MG tablet Generic drug: sitaGLIPtin Take 0.5 tablets (25 mg total) by mouth daily. What changed: how much to take   methocarbamol 500 MG tablet Commonly known as: Robaxin Take 1 tablet (500 mg total) by mouth every 6 (six) hours as needed for muscle spasms.   nateglinide 120 MG tablet Commonly known as: STARLIX Take 120 mg by mouth 3 (three) times daily before meals. For diabetes   OneTouch Verio test strip Generic drug: glucose blood USE TO TEST BLOOD SUGAR TWICE DAILY   polyethylene glycol 17 g packet Commonly known as: MIRALAX / GLYCOLAX Take 17 g by mouth daily. Start taking on: August 28, 2019   Toujeo SoloStar 300 UNIT/ML Solostar Pen Generic drug: insulin glargine (1 Unit Dial) Inject 15 Units into the skin at bedtime. What changed: how much to take   Vitamin D (Cholecalciferol) 25 MCG (1000 UT) Tabs Take 1,000 Units by mouth daily.            Discharge Care Instructions  (From admission, onward)         Start     Ordered   08/27/19 0000  Leave dressing on - Keep it clean, dry, and intact until clinic visit        08/27/19 1250   08/26/19 0000  Weight bearing as tolerated       Question Answer Comment  Laterality right   Extremity Lower      08/26/19 0930         Allergies  Allergen Reactions  . Exenatide Nausea Only    Dizziness  . Raloxifene Other (See Comments)    hot flashes    Follow-up Information    Paralee Cancel, MD. Schedule an appointment as soon as possible for Hatsuko Bizzarro visit in 2 weeks.   Specialty: Orthopedic Surgery Contact information: [address] [place] [place] [address] [phone]        Bartholome Bill, MD Follow up.   Specialty: Family Medicine Contact information: [address]  [address] [phone]                The results of significant diagnostics from this hospitalization (including imaging, microbiology, ancillary and laboratory) are listed below for reference.  Significant Diagnostic Studies: DG Chest 1 View  Result Date: 08/24/2019 CLINICAL DATA:  Status post fall today.  Right hip fracture. EXAM: CHEST  1 VIEW COMPARISON:  PA and lateral chest 11/12/2015. FINDINGS: Pacing device is unchanged. Heart size is upper normal. Lungs clear. No pneumothorax or pleural fluid. No acute or focal bony abnormality. IMPRESSION: No acute disease. Electronically Signed   By: Inge Rise M.D.   On: 08/24/2019 13:25   Pelvis Portable  Result Date: 08/25/2019 CLINICAL DATA:  Status post right total hip replacement. EXAM: PORTABLE PELVIS 1-2 VIEWS COMPARISON:  Preoperative radiograph yesterday. FINDINGS: Right hip arthroplasty in expected alignment. No periprosthetic lucency or fracture. Recent postsurgical change includes air and edema in the soft tissues. IMPRESSION: Right hip arthroplasty without immediate postoperative complication. Electronically Signed   By: Keith Rake M.D.   On: 08/25/2019 18:39   DG C-Arm 1-60 Min-No Report  Result Date: 08/25/2019 CLINICAL DATA:  Right total hip replacement. EXAM: OPERATIVE RIGHT HIP (WITH PELVIS IF PERFORMED) TECHNIQUE: Fluoroscopic spot image(s) were submitted for interpretation post-operatively. COMPARISON:  Preoperative radiograph yesterday. FINDINGS: Four fluoroscopic spot views obtained in the operating room. Right femoral neck fracture with interval right hip arthroplasty. Total fluoroscopy time 7 seconds. Total dose 0.9 mGy. IMPRESSION: Procedural fluoroscopy during right hip arthroplasty. Electronically Signed   By: Keith Rake M.D.   On: 08/25/2019 18:40   DG HIP OPERATIVE UNILAT W OR W/O PELVIS RIGHT  Result Date: 08/25/2019 CLINICAL DATA:  Right total hip replacement. EXAM: OPERATIVE RIGHT HIP (WITH  PELVIS IF PERFORMED) TECHNIQUE: Fluoroscopic spot image(s) were submitted for interpretation post-operatively. COMPARISON:  Preoperative radiograph yesterday. FINDINGS: Four fluoroscopic spot views obtained in the operating room. Right femoral neck fracture with interval right hip arthroplasty. Total fluoroscopy time 7 seconds. Total dose 0.9 mGy. IMPRESSION: Procedural fluoroscopy during right hip arthroplasty. Electronically Signed   By: Keith Rake M.D.   On: 08/25/2019 18:40   DG Hip Unilat W or Wo Pelvis 2-3 Views Right  Result Date: 08/24/2019 CLINICAL DATA:  Right hip pain after Abdulhadi Stopa fall today. Initial encounter. EXAM: DG HIP (WITH OR WITHOUT PELVIS) 2-3V RIGHT COMPARISON:  None. FINDINGS: The patient has an acute subcapital fracture of the right hip. The femoral head is located. No other acute abnormality is identified. Lower lumbar degenerative change noted. IMPRESSION: Acute subcapital fracture right hip. Electronically Signed   By: Inge Rise M.D.   On: 08/24/2019 13:24    Microbiology: Recent Results (from the past 240 hour(s))  SARS Coronavirus 2 by RT PCR (hospital order, performed in [facility] hospital lab) Nasopharyngeal Nasopharyngeal Swab     Status: None   Collection Time: 08/24/19  2:04 PM   Specimen: Nasopharyngeal Swab  Result Value Ref Range Status   SARS Coronavirus 2 NEGATIVE NEGATIVE Final    Comment: (NOTE) SARS-CoV-2 target nucleic acids are NOT DETECTED.  The SARS-CoV-2 RNA is generally detectable in upper and lower respiratory specimens during the acute phase of infection. The lowest concentration of SARS-CoV-2 viral copies this assay can detect is 250 copies / mL. Janeen Watson negative result does not preclude SARS-CoV-2 infection and should not be used as the sole basis for treatment or other patient management decisions.  Janicia Monterrosa negative result may occur with improper specimen collection / handling, submission of specimen other than nasopharyngeal swab, presence of  viral mutation(s) within the areas targeted by this assay, and inadequate number of viral copies (<250 copies / mL). Ezell Melikian negative result must be combined with clinical  observations, patient history, and epidemiological information.  Fact Sheet for Patients:   StrictlyIdeas.no  Fact Sheet for Healthcare Providers: BankingDealers.co.za  This test is not yet approved or  cleared by the [place] FDA and has been authorized for detection and/or diagnosis of SARS-CoV-2 by FDA under an Emergency Use Authorization (EUA).  This EUA will remain in effect (meaning this test can be used) for the duration of the COVID-19 declaration under Section 564(b)(1) of the Act, 21 U.S.C. section 360bbb-3(b)(1), unless the authorization is terminated or revoked sooner.  Performed at [facility], [place] [address] [place], [place] [address]   Surgical PCR screen     Status: None   Collection Time: 08/25/19 12:19 AM   Specimen: Nasal Mucosa; Nasal Swab  Result Value Ref Range Status   MRSA, PCR NEGATIVE NEGATIVE Final   Staphylococcus aureus NEGATIVE NEGATIVE Final    Comment: (NOTE) The Xpert SA Assay (FDA approved for NASAL specimens in patients 66 years of age and older), is one component of Lamar Naef comprehensive surveillance program. It is not intended to diagnose infection nor to guide or monitor treatment. Performed at [facility], [place] [address] [place], [place] [address]   SARS Coronavirus 2 by RT PCR (hospital order, performed in [facility] hospital lab) Nasopharyngeal Nasopharyngeal Swab     Status: None   Collection Time: 08/26/19  6:35 PM   Specimen: Nasopharyngeal Swab  Result Value Ref Range Status   SARS Coronavirus 2 NEGATIVE NEGATIVE Final    Comment: (NOTE) SARS-CoV-2 target nucleic acids are NOT DETECTED.  The SARS-CoV-2 RNA is generally detectable in upper and lower respiratory specimens during  the acute phase of infection. The lowest concentration of SARS-CoV-2 viral copies this assay can detect is 250 copies / mL. Irvan Tiedt negative result does not preclude SARS-CoV-2 infection and should not be used as the sole basis for treatment or other patient management decisions.  Annalena Piatt negative result may occur with improper specimen collection / handling, submission of specimen other than nasopharyngeal swab, presence of viral mutation(s) within the areas targeted by this assay, and inadequate number of viral copies (<250 copies / mL). Reyah Streeter negative result must be combined with clinical observations, patient history, and epidemiological information.  Fact Sheet for Patients:   StrictlyIdeas.no  Fact Sheet for Healthcare Providers: BankingDealers.co.za  This test is not yet approved or  cleared by the [place] FDA and has been authorized for detection and/or diagnosis of SARS-CoV-2 by FDA under an Emergency Use Authorization (EUA).  This EUA will remain in effect (meaning this test can be used) for the duration of the COVID-19 declaration under Section 564(b)(1) of the Act, 21 U.S.C. section 360bbb-3(b)(1), unless the authorization is terminated or revoked sooner.  Performed at [facility], [place] [address] [place], [place] [address]      Labs: Basic Metabolic Panel: Recent Labs  Lab 08/24/19 1241 08/25/19 0319 08/26/19 0340 08/27/19 0308  NA 140 137 137 136  K 4.5 4.1 5.1 4.5  CL 102 101 102 102  CO2 28 25 24 23   GLUCOSE 170* 188* 215* 159*  BUN 34* 32* 34* 43*  CREATININE 1.72* 1.72* 1.56* 1.63*  CALCIUM 9.9 9.7 9.5 9.5   Liver Function Tests: No results for input(s): AST, ALT, ALKPHOS, BILITOT, PROT, ALBUMIN in the last 168 hours. No results for input(s): LIPASE, AMYLASE in the last 168 hours. No results for input(s): AMMONIA in the last 168 hours. CBC: Recent Labs  Lab  08/24/19 1241 08/25/19 0319  08/26/19 0340 08/27/19 0308  WBC 9.1 10.6* 8.0 8.6  NEUTROABS 7.5  --   --   --   HGB 15.3* 14.7 13.0 12.0  HCT 48.0* 46.0 40.5 37.5  MCV 90.9 90.4 91.6 91.5  PLT 126* 125* 101* 85*   Cardiac Enzymes: No results for input(s): CKTOTAL, CKMB, CKMBINDEX, TROPONINI in the last 168 hours. BNP: BNP (last 3 results) No results for input(s): BNP in the last 8760 hours.  ProBNP (last 3 results) No results for input(s): PROBNP in the last 8760 hours.  CBG: Recent Labs  Lab 08/26/19 2103 08/26/19 2342 08/27/19 0345 08/27/19 0844 08/27/19 1134  GLUCAP 229* 171* 158* 182* 183*       Signed:  Fayrene Helper MD.  [facility] 08/27/2019, 1:43 PM

## 2019-08-27 NOTE — Evaluation (Signed)
Occupational Therapy Evaluation Patient Details Name: Janet Owen MRN: [id number] DOB: 02-Jul-1933 Today's Date: 08/27/2019    History of Present Illness 86 yo female admitted with hip fracture after falling at home. S/P R THA-direct anterior 08/25/19. Hx of Afib, DM, CHF, CKD, ICD.   Clinical Impression   Janet Owen is an 86 year old woman s/p hip replacement with decreased ROM and strength of right lower extremity, decreased activity tolerance and pain resulting in decreased ability to perform functional mobility and ADLs. Patient will benefit from skilled OT services to improve deficits and learn compensatory strategies in order to return to PLOF. Today patient's mobility limited by reports of "feeling weird in the head" a "hot face" and patient emotional.     Follow Up Recommendations  SNF    Equipment Recommendations   (defer to next venue)    Recommendations for Other Services       Precautions / Restrictions Precautions Precautions: Fall Restrictions Other Position/Activity Restrictions: WBAT      Mobility Bed Mobility   Bed Mobility: Sit to Supine       Sit to supine: Mod assist   General bed mobility comments: Patient seated in recliner when therapist entered the room.  Transfers Overall transfer level: Needs assistance Equipment used: Rolling walker (2 wheeled) Transfers: Sit to/from Stand Sit to Stand: Mod assist Stand pivot transfers: Total assist       General transfer comment: Mod assist to stand from recliner. Mobility limited by patient's symptoms.    Balance Overall balance assessment: Needs assistance Sitting-balance support: No upper extremity supported;Feet supported Sitting balance-Leahy Scale: Good     Standing balance support: Bilateral upper extremity supported;During functional activity Standing balance-Leahy Scale: Fair                             ADL either performed or assessed with clinical  judgement   ADL Overall ADL's : Needs assistance/impaired Eating/Feeding: Independent   Grooming: Set up;Sitting   Upper Body Bathing: Set up;Sitting   Lower Body Bathing: Moderate assistance;Sit to/from stand   Upper Body Dressing : Set up;Sitting   Lower Body Dressing: Maximal assistance;Sit to/from stand   Toilet Transfer: +2 for safety/equipment;Moderate assistance;Stand-pivot;BSC;RW   Toileting- Clothing Manipulation and Hygiene: Maximal assistance       Functional mobility during ADLs: Rolling walker;Moderate assistance General ADL Comments: mod assist for sit to stand. mobility limited on eval by patient reporting "feeling weak," "weird feeling in the head" and "a hot face"     Vision   Vision Assessment?: No apparent visual deficits     Perception     Praxis      Pertinent Vitals/Pain Pain Assessment: No/denies pain Pain Location: R hip Pain Descriptors / Indicators: Discomfort;Sore Pain Intervention(s): Premedicated before session;Monitored during session;Repositioned     Hand Dominance     Extremity/Trunk Assessment Upper Extremity Assessment Upper Extremity Assessment: Overall WFL for tasks assessed   Lower Extremity Assessment Lower Extremity Assessment: Defer to PT evaluation   Cervical / Trunk Assessment Cervical / Trunk Assessment: Normal   Communication Communication Communication: No difficulties   Cognition Arousal/Alertness: Awake/alert Behavior During Therapy:  (emotional) Overall Cognitive Status: Within Functional Limits for tasks assessed                                 General Comments: fearful of falling   General Comments  Exercises     Shoulder Instructions      Home Living Family/patient expects to be discharged to:: Private residence Living Arrangements: Spouse/significant other Available Help at Discharge: Family Type of Home: House Home Access: Stairs to enter Technical brewer of Steps:  2 Entrance Stairs-Rails: Right Home Layout: Able to live on main level with bedroom/bathroom               Home Equipment: Walker - 2 wheels;Crutches          Prior Functioning/Environment Level of Independence: Independent                 OT Problem List: Decreased range of motion;Decreased strength;Decreased activity tolerance;Impaired balance (sitting and/or standing);Decreased knowledge of use of DME or AE;Pain      OT Treatment/Interventions: Self-care/ADL training;DME and/or AE instruction;Therapeutic activities;Balance training;Patient/family education    OT Goals(Current goals can be found in the care plan section) Acute Rehab OT Goals Patient Stated Goal: to improve independence OT Goal Formulation: With patient Time For Goal Achievement: 09/10/19 Potential to Achieve Goals: Good  OT Frequency: Min 2X/week   Barriers to D/C:            Co-evaluation              AM-PAC OT "6 Clicks" Daily Activity     Outcome Measure Help from another person eating meals?: None Help from another person taking care of personal grooming?: A Little Help from another person toileting, which includes using toliet, bedpan, or urinal?: A Lot Help from another person bathing (including washing, rinsing, drying)?: A Lot Help from another person to put on and taking off regular upper body clothing?: A Little Help from another person to put on and taking off regular lower body clothing?: A Lot 6 Click Score: 16   End of Session Equipment Utilized During Treatment: Gait belt;Rolling walker Nurse Communication: Mobility status  Activity Tolerance: Other (comment) (emotional and reports of feeling "weird") Patient left: in chair;with call bell/phone within reach  OT Visit Diagnosis: Other abnormalities of gait and mobility (R26.89);Muscle weakness (generalized) (M62.81)                Time: 4888-9169 OT Time Calculation (min): 15 min Charges:  OT General Charges $OT  Visit: 1 Visit OT Evaluation $OT Eval Low Complexity: 1 Low  Janet Owen, OTR/L [place]  Office [phone] Pager: [place] 08/27/2019, 1:02 PM

## 2019-08-27 NOTE — Progress Notes (Signed)
     Subjective: 2 Days Post-Op Procedure(s) (LRB): TOTAL HIP ARTHROPLASTY ANTERIOR APPROACH (Right)   Patient reports pain as mild, pain controlled. Light headed incident after the first PT yesterday, but much better during the second session.  No reported events throughout the night.  Ready to be discharged once cleared medically.  Disposition TBD.  Follow up in the clinic in 2 weeks.  Knows to call with any questions or concerns.       Objective:   VITALS:   Vitals:   08/26/19 2101 08/27/19 0347  BP: (!) 127/59 137/66  Pulse: 64 66  Resp: 20 16  Temp: 98 F (36.7 C) 98.1 F (36.7 C)  SpO2: 97% 94%    Dorsiflexion/Plantar flexion intact Incision: dressing C/D/I No cellulitis present Compartment soft  LABS Recent Labs    08/25/19 0319 08/26/19 0340 08/27/19 0308  HGB 14.7 13.0 12.0  HCT 46.0 40.5 37.5  WBC 10.6* 8.0 8.6  PLT 125* 101* 85*    Recent Labs    08/25/19 0319 08/26/19 0340 08/27/19 0308  NA 137 137 136  K 4.1 5.1 4.5  BUN 32* 34* 43*  CREATININE 1.72* 1.56* 1.63*  GLUCOSE 188* 215* 159*     Assessment/Plan: 2 Days Post-Op Procedure(s) (LRB): TOTAL HIP ARTHROPLASTY ANTERIOR APPROACH (Right)   Up with therapy Discharge when medically ready    Ortho recommendations:  ASA 81 mg bid for 4 weeks for anticoagulation, unless other medically indicated.  Norco for pain management (Rx written).  MiraLax and Colace for constipation  Iron 325 mg tid for 2-3 weeks   WBAT on the right leg.  Dressing to remain in place until follow in clinic in 2 weeks.  Dressing is waterproof and may shower with it in place.  Follow up in 2 weeks at [facility]. Follow up with OLIN,Alajia Schmelzer D in 2 weeks.  Contact information:  [facility] [address], [address] [place] [phone]              Danae Orleans PA-C  [facility]  [place] [address], [place], [place], [place] [address] Phone: [phone] [email or web address] Facebook  [employer]

## 2019-10-21 ENCOUNTER — Other Ambulatory Visit: Payer: Self-pay

## 2019-10-21 ENCOUNTER — Ambulatory Visit: Payer: Medicare Other | Attending: Orthopedic Surgery | Admitting: Physical Therapy

## 2019-10-21 ENCOUNTER — Encounter: Payer: Self-pay | Admitting: Physical Therapy

## 2019-10-21 DIAGNOSIS — R262 Difficulty in walking, not elsewhere classified: Secondary | ICD-10-CM | POA: Diagnosis not present

## 2019-10-21 DIAGNOSIS — R2681 Unsteadiness on feet: Secondary | ICD-10-CM | POA: Insufficient documentation

## 2019-10-21 DIAGNOSIS — R2689 Other abnormalities of gait and mobility: Secondary | ICD-10-CM | POA: Diagnosis present

## 2019-10-21 NOTE — Therapy (Signed)
[place], [place], [address] Phone: [phone]   Fax:  [phone]  Physical Therapy Evaluation  Patient Details  Name: Janet Owen MRN: [id number] Date of Birth: 1933-11-28 Referring Provider (PT): Cathe Mons Date: 10/21/2019   PT End of Session - 10/21/19 1524    Visit Number 1    Date for PT Re-Evaluation 11/20/19    PT Start Time 1437    PT Stop Time 1525    PT Time Calculation (min) 48 min    Activity Tolerance Patient tolerated treatment well    Behavior During Therapy [facility] for tasks assessed/performed           Past Medical History:  Diagnosis Date   Atrial fibrillation ([place])    DM (diabetes mellitus) ([place])    Heart failure ([place])    Hypertension     Past Surgical History:  Procedure Laterality Date   A-FLUTTER ABLATION N/A 07/30/2016   Procedure: A-Flutter Ablation;  Surgeon: Evans Lance, MD;  Location: [place] [facility];  Service: Cardiovascular;  Laterality: N/A;   APPENDECTOMY     BACK SURGERY     EP IMPLANTABLE DEVICE N/A 11/11/2015   Procedure: BiVi Upgrade;  Surgeon: Evans Lance, MD;  Location: [place] [facility];  Service: Cardiovascular;  Laterality: N/A;   EP IMPLANTABLE DEVICE N/A 09/15/2015   Procedure: BiV ICD Insertion CRT-D;  Surgeon: Will Meredith Leeds, MD;  Location: [place] [facility];  Service: Cardiovascular;  Laterality: N/A;   PARTIAL HYSTERECTOMY     REPLACEMENT TOTAL KNEE     TOTAL HIP ARTHROPLASTY Right 08/25/2019   Procedure: TOTAL HIP ARTHROPLASTY ANTERIOR APPROACH;  Surgeon: Paralee Cancel, MD;  [facility];  Service: Orthopedics;  Laterality: Right;   VESICOVAGINAL FISTULA CLOSURE W/ TAH      There were no vitals filed for this visit.    Subjective Assessment - 10/21/19 1441    Subjective Patient reports a fall on July 11th, she sustained a right hip fracture, they did an anterior approach THA on 08/25/19.  She  was in a SNF for 2 weeks, then to home.  She had home PT until about a week ago, she reports doing well.  She reports that she is very worried about her balance and walking.    Limitations Walking;House hold activities    Patient Stated Goals walk better, feel more stable with balance    Currently in Pain? No/denies              [facility] PT Assessment - 10/21/19 0001      Assessment   Medical Diagnosis s/p right anterior THA    Referring Provider (PT) Alvan Dame    Onset Date/Surgical Date 08/25/19    Prior Therapy at home      Precautions   Precautions ICD/Pacemaker      Balance Screen   Has the patient fallen in the past 6 months Yes    How many times? 1    Has the patient had a decrease in activity level because of a fear of falling?  Yes    Is the patient reluctant to leave their home because of a fear of falling?  Yes      Home Environment   Additional Comments has stairs, but does not have to go up them,  Did housework      Prior Function   Level of Independence Independent   no device,  but shopping was difficult   Vocation Retired    Leisure no exercise      ROM / Strength   AROM / PROM / Strength AROM;Strength      AROM   AROM Assessment Site Hip    Right/Left Hip Right    Right Hip Flexion 70    Right Hip ABduction 15      Strength   Strength Assessment Site Hip    Right/Left Hip Right    Right Hip Flexion 4-/5    Right Hip Extension 4-/5    Right Hip ABduction 4-/5      Palpation   Palpation comment she is tender laterally along the ITB from the GT area      Transfers   Comments has to use hands to get up      Ambulation/Gait   Gait Comments uses a FWW, small step, tried her with a SPC very slow, smaller steps, very deliberate slow movements due to fear      Standardized Balance Assessment   Standardized Balance Assessment Timed Up and Go Test;Berg Balance Test      Berg Balance Test   Sit to Stand Able to stand  independently using hands    Standing  Unsupported Able to stand 30 seconds unsupported    Sitting with Back Unsupported but Feet Supported on Floor or Stool Able to sit safely and securely 2 minutes    Stand to Sit Controls descent by using hands    Transfers Able to transfer safely, definite need of hands    Standing Unsupported with Eyes Closed Able to stand 10 seconds with supervision    Standing Unsupported with Feet Together Able to place feet together independently but unable to hold for 30 seconds    From Standing, Reach Forward with Outstretched Arm Can reach forward >12 cm safely (5")    From Standing Position, Pick up Object from Floor Unable to pick up shoe, but reaches 2-5 cm (1-2") from shoe and balances independently    From Standing Position, Turn to Look Behind Over each Shoulder Turn sideways only but maintains balance    Turn 360 Degrees Able to turn 360 degrees safely but slowly    Standing Unsupported, Alternately Place Feet on Step/Stool Needs assistance to keep from falling or unable to try    Standing Unsupported, One Foot in [employer] balance while stepping or standing    Standing on One Leg Unable to try or needs assist to prevent fall    Total Score 29      Timed Up and Go Test   Normal TUG (seconds) 22   with FWW   TUG Comments with a SPC it was 42 seconds                      Objective measurements completed on examination: See above findings.       [facility] Adult PT Treatment/Exercise - 10/21/19 0001      Exercises   Exercises Knee/Hip      Knee/Hip Exercises: Aerobic   Nustep Level 3 x 5 minutes                  PT Education - 10/21/19 1523    Education Details HEP kicks and marching holding onto walker    Person(s) Educated Patient    Methods Explanation;Demonstration;Handout;Verbal cues    Comprehension Verbalized understanding            PT Short Term  Goals - 10/21/19 1534      PT SHORT TERM GOAL #1   Title independent with initial HEP    Time 2     Period Weeks    Status New             PT Long Term Goals - 10/21/19 1534      PT LONG TERM GOAL #1   Title TUG with SPC 25 seconds    Time 8    Period Weeks    Status New      PT LONG TERM GOAL #2   Title increase Berg to 42/56    Time 8    Period Weeks    Status New      PT LONG TERM GOAL #3   Title independent with advanced HEP for strength and balance    Time 8    Period Weeks    Status New      PT LONG TERM GOAL #4   Title walk 250 feet with a SPC    Time 8    Period Weeks    Status New                  Plan - 10/21/19 1525    Clinical Impression Statement Patient had a fall and fractured her right hip.  She underwent a right anterior approach THA on 08/25/19.  She went to SNF for two weeks and has had home PT, she reports that she is still very slow and her balance is the biggest issue, PLOF was no device and a community ambulator.  Her TUG with walker was 22 seconds, TUG with SPC was 42 seconds, BERG was 29/56 all putting her at a very high risk for falls, She is weak in the right hip.    Stability/Clinical Decision Making Stable/Uncomplicated    Clinical Decision Making Low    Rehab Potential Good    PT Frequency 2x / week    PT Duration 8 weeks    PT Treatment/Interventions Gait training;Neuromuscular re-education;Balance training;Therapeutic exercise;Therapeutic activities;Functional mobility training;Stair training;Patient/family education;Manual techniques    PT Next Visit Plan slowly advance strength and balance    Consulted and Agree with Plan of Care Patient           Patient will benefit from skilled therapeutic intervention in order to improve the following deficits and impairments:  Abnormal gait, Decreased range of motion, Difficulty walking, Cardiopulmonary status limiting activity, Decreased activity tolerance, Decreased balance, Decreased mobility, Decreased strength  Visit Diagnosis: Difficulty in walking, not elsewhere classified -  Plan: PT plan of care cert/re-cert  Other abnormalities of gait and mobility - Plan: PT plan of care cert/re-cert  Unsteady gait when walking - Plan: PT plan of care cert/re-cert     Problem List Patient Active Problem List   Diagnosis Date Noted   Hip fracture ([place]) 08/24/2019   PAF (paroxysmal atrial fibrillation) ([place]) 08/24/2019   DNR (do not resuscitate) 08/24/2019   ICD BV  09/15/2015:  St. Jude Medical [place] model [phone] (serial  Number [id number]) in situ 07/23/2018   Encounter for management of biventricular implantable cardioverter-defibrillator (ICD) 07/23/2018   Type 2 diabetes mellitus with hyperglycemia ([place]) 08/31/2016   CKD (chronic kidney disease) stage 3, GFR 30-59 ml/min 08/14/2016   Mixed dyslipidemia 08/14/2016   Vitamin D insufficiency 08/14/2016   Typical atrial flutter ([place]) 07/30/2016   Atrial flutter ([place]) 71/24/5809   Chronic systolic heart failure ([place]) 11/11/2015   CHF (congestive heart failure) (  [place]) 12/52/7129   Chronic systolic congestive heart failure ([place])    Dyspnea 08/20/2014   Abnormal CT scan, chest 08/20/2014   Essential (primary) hypertension 08/20/2014   Abnormal computed tomography scan 08/20/2014   Encounter to establish care 08/09/2014   Type 2 diabetes mellitus without complication ([place]) 29/10/299    Sumner Boast., PT 10/21/2019, 3:38 PM  [place] [address] [place], [place], [address] Phone: [phone]   Fax:  [phone]  Name: DEVAN BABINO MRN: [id number] Date of Birth: [birth year]

## 2019-10-27 ENCOUNTER — Encounter: Payer: Self-pay | Admitting: Physical Therapy

## 2019-10-27 ENCOUNTER — Other Ambulatory Visit: Payer: Self-pay

## 2019-10-27 ENCOUNTER — Ambulatory Visit: Payer: Medicare Other | Admitting: Physical Therapy

## 2019-10-27 DIAGNOSIS — R2689 Other abnormalities of gait and mobility: Secondary | ICD-10-CM

## 2019-10-27 DIAGNOSIS — R2681 Unsteadiness on feet: Secondary | ICD-10-CM

## 2019-10-27 DIAGNOSIS — R262 Difficulty in walking, not elsewhere classified: Secondary | ICD-10-CM | POA: Diagnosis not present

## 2019-10-27 NOTE — Therapy (Signed)
[place], [place], [address] Phone: [phone]   Fax:  [phone]  Physical Therapy Treatment  Patient Details  Name: Janet Owen MRN: [id number] Date of Birth: 07/23/1933 Referring Provider (PT): Alvan Dame   Encounter Date: 10/27/2019   PT End of Session - 10/27/19 1056    Visit Number 2    Date for PT Re-Evaluation 11/20/19    PT Start Time 3382    PT Stop Time 1057    PT Time Calculation (min) 42 min    Activity Tolerance Patient tolerated treatment well    Behavior During Therapy [facility] for tasks assessed/performed           Past Medical History:  Diagnosis Date  . Atrial fibrillation ([place])   . DM (diabetes mellitus) ([place])   . Heart failure ([place])   . Hypertension     Past Surgical History:  Procedure Laterality Date  . A-FLUTTER ABLATION N/A 07/30/2016   Procedure: A-Flutter Ablation;  Surgeon: Evans Lance, MD;  Location: [place] [facility];  Service: Cardiovascular;  Laterality: N/A;  . APPENDECTOMY    . BACK SURGERY    . EP IMPLANTABLE DEVICE N/A 11/11/2015   Procedure: BiVi Upgrade;  Surgeon: Evans Lance, MD;  Location: [place] [facility];  Service: Cardiovascular;  Laterality: N/A;  . EP IMPLANTABLE DEVICE N/A 09/15/2015   Procedure: BiV ICD Insertion CRT-D;  Surgeon: Will Meredith Leeds, MD;  Location: [place] [facility];  Service: Cardiovascular;  Laterality: N/A;  . PARTIAL HYSTERECTOMY    . REPLACEMENT TOTAL KNEE    . TOTAL HIP ARTHROPLASTY Right 08/25/2019   Procedure: TOTAL HIP ARTHROPLASTY ANTERIOR APPROACH;  Surgeon: Paralee Cancel, MD;  [facility];  Service: Orthopedics;  Laterality: Right;  Marland Kitchen VESICOVAGINAL FISTULA CLOSURE W/ TAH      There were no vitals filed for this visit.   Subjective Assessment - 10/27/19 1015    Subjective Doing better, was sore after last session    Currently in Pain? No/denies                              [facility] Adult PT Treatment/Exercise - 10/27/19 0001      Ambulation/Gait   Ambulation/Gait Yes    Ambulation Distance (Feet) 100 Feet    Assistive device None    Gait Pattern Step-through pattern;Left foot flat;Right foot flat   decrease heel strike      High Level Balance   High Level Balance Activities Side stepping;Backward walking      Knee/Hip Exercises: Aerobic   Recumbent Bike L0 x 5 min       Knee/Hip Exercises: Standing   Other Standing Knee Exercises Alt 4in box taps HHA x 1 and X2 3x5       Knee/Hip Exercises: Seated   Long Arc Quad Right;2 sets;10 reps    Ball Squeeze 2x10 3sec hold    Hamstring Curl Right;2 sets;15 reps    Hamstring Limitations red tband    Abduction/Adduction  2 sets;15 reps;Right;AROM    Abd/Adduction Limitations green Tband     Sit to Sand 2 sets;5 reps;with UE support                    PT Short Term Goals - 10/27/19 1100      PT SHORT TERM GOAL #1   Title independent with initial HEP  Status Achieved             PT Long Term Goals - 10/21/19 1534      PT LONG TERM GOAL #1   Title TUG with SPC 25 seconds    Time 8    Period Weeks    Status New      PT LONG TERM GOAL #2   Title increase Berg to 42/56    Time 8    Period Weeks    Status New      PT LONG TERM GOAL #3   Title independent with advanced HEP for strength and balance    Time 8    Period Weeks    Status New      PT LONG TERM GOAL #4   Title walk 250 feet with a SPC    Time 8    Period Weeks    Status New                 Plan - 10/27/19 1057    Clinical Impression Statement Pt tolerated an initial progression to TE well. She did well with seated strengthening exercises. Some hip weakness noted with HS curls, hip with rotated internally and externally when contracting HS. She is very hesitate to ambulate with SPC, able to ambulate without AD slow gait speed with decrease stp length and heel strike.    Stability/Clinical Decision Making  Stable/Uncomplicated    Rehab Potential Good    PT Frequency 2x / week    PT Duration 8 weeks    PT Treatment/Interventions Gait training;Neuromuscular re-education;Balance training;Therapeutic exercise;Therapeutic activities;Functional mobility training;Stair training;Patient/family education;Manual techniques    PT Next Visit Plan slowly advance strength and balance           Patient will benefit from skilled therapeutic intervention in order to improve the following deficits and impairments:  Abnormal gait, Decreased range of motion, Difficulty walking, Cardiopulmonary status limiting activity, Decreased activity tolerance, Decreased balance, Decreased mobility, Decreased strength  Visit Diagnosis: Other abnormalities of gait and mobility  Unsteady gait when walking  Difficulty in walking, not elsewhere classified     Problem List Patient Active Problem List   Diagnosis Date Noted  . Hip fracture ([place]) 08/24/2019  . PAF (paroxysmal atrial fibrillation) ([place]) 08/24/2019  . DNR (do not resuscitate) 08/24/2019  . ICD BV  09/15/2015:  St. Jude Medical [place] model CD3357-40Q (serial  Number [id number]) in situ 07/23/2018  . Encounter for management of biventricular implantable cardioverter-defibrillator (ICD) 07/23/2018  . Type 2 diabetes mellitus with hyperglycemia ([place]) 08/31/2016  . CKD (chronic kidney disease) stage 3, GFR 30-59 ml/min 08/14/2016  . Mixed dyslipidemia 08/14/2016  . Vitamin D insufficiency 08/14/2016  . Typical atrial flutter ([place]) 07/30/2016  . Atrial flutter ([place]) 07/30/2016  . Chronic systolic heart failure ([place]) 11/11/2015  . CHF (congestive heart failure) ([place]) 09/15/2015  . Chronic systolic congestive heart failure ([place])   . Dyspnea 08/20/2014  . Abnormal CT scan, chest 08/20/2014  . Essential (primary) hypertension 08/20/2014  . Abnormal computed tomography scan 08/20/2014  . Encounter to establish care 08/09/2014  . Type 2 diabetes mellitus  without complication ([place]) 16/11/9602    Scot Jun, PTA 10/27/2019, 11:00 AM  [place], [place], [address] Phone: [phone]   Fax:  [phone]  Name: Janet Owen MRN: [id number] Date of Birth: May 10, 1933

## 2019-10-30 ENCOUNTER — Ambulatory Visit: Payer: Medicare Other | Admitting: Physical Therapy

## 2019-10-30 ENCOUNTER — Other Ambulatory Visit: Payer: Self-pay

## 2019-10-30 ENCOUNTER — Encounter: Payer: Self-pay | Admitting: Physical Therapy

## 2019-10-30 DIAGNOSIS — R262 Difficulty in walking, not elsewhere classified: Secondary | ICD-10-CM | POA: Diagnosis not present

## 2019-10-30 DIAGNOSIS — R2689 Other abnormalities of gait and mobility: Secondary | ICD-10-CM

## 2019-10-30 DIAGNOSIS — R2681 Unsteadiness on feet: Secondary | ICD-10-CM

## 2019-10-30 NOTE — Therapy (Signed)
[place], [place], [address] Phone: [phone]   Fax:  [phone]  Physical Therapy Treatment  Patient Details  Name: Janet Owen MRN: [id number] Date of Birth: Feb 07, 1934 Referring Provider (PT): Cathe Mons Date: 10/30/2019   PT End of Session - 10/30/19 1052    Visit Number 3    Date for PT Re-Evaluation 11/20/19    PT Start Time 1012    PT Stop Time 1056    PT Time Calculation (min) 44 min    Activity Tolerance Patient tolerated treatment well    Behavior During Therapy [facility] for tasks assessed/performed           Past Medical History:  Diagnosis Date  . Atrial fibrillation ([place])   . DM (diabetes mellitus) ([place])   . Heart failure ([place])   . Hypertension     Past Surgical History:  Procedure Laterality Date  . A-FLUTTER ABLATION N/A 07/30/2016   Procedure: A-Flutter Ablation;  Surgeon: Evans Lance, MD;  Location: [place] [facility];  Service: Cardiovascular;  Laterality: N/A;  . APPENDECTOMY    . BACK SURGERY    . EP IMPLANTABLE DEVICE N/A 11/11/2015   Procedure: BiVi Upgrade;  Surgeon: Evans Lance, MD;  Location: [place] [facility];  Service: Cardiovascular;  Laterality: N/A;  . EP IMPLANTABLE DEVICE N/A 09/15/2015   Procedure: BiV ICD Insertion CRT-D;  Surgeon: Will Meredith Leeds, MD;  Location: [place] [facility];  Service: Cardiovascular;  Laterality: N/A;  . PARTIAL HYSTERECTOMY    . REPLACEMENT TOTAL KNEE    . TOTAL HIP ARTHROPLASTY Right 08/25/2019   Procedure: TOTAL HIP ARTHROPLASTY ANTERIOR APPROACH;  Surgeon: Paralee Cancel, MD;  [facility];  Service: Orthopedics;  Laterality: Right;  Marland Kitchen VESICOVAGINAL FISTULA CLOSURE W/ TAH      There were no vitals filed for this visit.   Subjective Assessment - 10/30/19 1009    Subjective Doing good no pain    Currently in Pain? No/denies                             [facility] Adult PT  Treatment/Exercise - 10/30/19 0001      Knee/Hip Exercises: Aerobic   Recumbent Bike L0 x 5 min    Nustep L3 no UE x 3 min       Knee/Hip Exercises: Machines for Strengthening   Cybex Knee Extension 5lb 2x10     Cybex Knee Flexion 20lb 2x10     Cybex Leg Press 20lb 2x10      Knee/Hip Exercises: Seated   Long Arc Quad Right;2 sets;10 reps    Long Arc Quad Weight 2 lbs.    Ball Squeeze 2x10 3sec hold    Hamstring Curl Right;2 sets;10 reps    Hamstring Limitations red tband    Abduction/Adduction  2 sets;15 reps;Right;AROM    Abd/Adduction Limitations blue Tbanfd    Sit to Sand 5 reps;with UE support;3 sets                    PT Short Term Goals - 10/27/19 1100      PT SHORT TERM GOAL #1   Title independent with initial HEP    Status Achieved             PT Long Term Goals - 10/21/19 1534      PT LONG TERM GOAL #1  Title TUG with SPC 25 seconds    Time 8    Period Weeks    Status New      PT LONG TERM GOAL #2   Title increase Berg to 42/56    Time 8    Period Weeks    Status New      PT LONG TERM GOAL #3   Title independent with advanced HEP for strength and balance    Time 8    Period Weeks    Status New      PT LONG TERM GOAL #4   Title walk 250 feet with a SPC    Time 8    Period Weeks    Status New                 Plan - 10/30/19 1052    Clinical Impression Statement Pt did well progressing towards machine level strengthening interventions. Increase time needed to start gait once standing. Increase fatigue noted on leg press. Pt is unable to stand without UE assist.    Stability/Clinical Decision Making Stable/Uncomplicated    Rehab Potential Good    PT Frequency 2x / week    PT Duration 8 weeks    PT Treatment/Interventions Gait training;Neuromuscular re-education;Balance training;Therapeutic exercise;Therapeutic activities;Functional mobility training;Stair training;Patient/family education;Manual techniques    PT Next Visit  Plan slowly advance strength and balance           Patient will benefit from skilled therapeutic intervention in order to improve the following deficits and impairments:  Abnormal gait, Decreased range of motion, Difficulty walking, Cardiopulmonary status limiting activity, Decreased activity tolerance, Decreased balance, Decreased mobility, Decreased strength  Visit Diagnosis: Other abnormalities of gait and mobility  Unsteady gait when walking     Problem List Patient Active Problem List   Diagnosis Date Noted  . Hip fracture ([place]) 08/24/2019  . PAF (paroxysmal atrial fibrillation) ([place]) 08/24/2019  . DNR (do not resuscitate) 08/24/2019  . ICD BV  09/15/2015:  St. Jude Medical [place] model CD3357-40Q (serial  Number [id number]) in situ 07/23/2018  . Encounter for management of biventricular implantable cardioverter-defibrillator (ICD) 07/23/2018  . Type 2 diabetes mellitus with hyperglycemia ([place]) 08/31/2016  . CKD (chronic kidney disease) stage 3, GFR 30-59 ml/min 08/14/2016  . Mixed dyslipidemia 08/14/2016  . Vitamin D insufficiency 08/14/2016  . Typical atrial flutter ([place]) 07/30/2016  . Atrial flutter ([place]) 07/30/2016  . Chronic systolic heart failure ([place]) 11/11/2015  . CHF (congestive heart failure) ([place]) 09/15/2015  . Chronic systolic congestive heart failure ([place])   . Dyspnea 08/20/2014  . Abnormal CT scan, chest 08/20/2014  . Essential (primary) hypertension 08/20/2014  . Abnormal computed tomography scan 08/20/2014  . Encounter to establish care 08/09/2014  . Type 2 diabetes mellitus without complication ([place]) 96/28/3662    Scot Jun, PTA 10/30/2019, 10:57 AM  [place] [place], [place], [address] Phone: [phone]   Fax:  [phone]  Name: DESSIRAE SCAROLA MRN: [id number] Date of Birth: Jun 06, 1933

## 2019-11-03 ENCOUNTER — Other Ambulatory Visit: Payer: Self-pay

## 2019-11-03 ENCOUNTER — Ambulatory Visit: Payer: Medicare Other | Admitting: Physical Therapy

## 2019-11-03 ENCOUNTER — Encounter: Payer: Self-pay | Admitting: Physical Therapy

## 2019-11-03 DIAGNOSIS — R2689 Other abnormalities of gait and mobility: Secondary | ICD-10-CM

## 2019-11-03 DIAGNOSIS — R262 Difficulty in walking, not elsewhere classified: Secondary | ICD-10-CM

## 2019-11-03 DIAGNOSIS — R2681 Unsteadiness on feet: Secondary | ICD-10-CM

## 2019-11-03 NOTE — Therapy (Signed)
[place]. [place], [place], [address] Phone: [phone]   Fax:  [phone]  Physical Therapy Treatment  Patient Details  Name: Janet Owen MRN: [id number] Date of Birth: 1933/02/28 Referring Provider (PT): Cathe Mons Date: 11/03/2019   PT End of Session - 11/03/19 1053    Visit Number 4    Date for PT Re-Evaluation 11/20/19    PT Start Time 3546    PT Stop Time 1055    PT Time Calculation (min) 40 min    Activity Tolerance Patient tolerated treatment well    Behavior During Therapy [facility] for tasks assessed/performed           Past Medical History:  Diagnosis Date  . Atrial fibrillation ([place])   . DM (diabetes mellitus) ([place])   . Heart failure ([place])   . Hypertension     Past Surgical History:  Procedure Laterality Date  . A-FLUTTER ABLATION N/A 07/30/2016   Procedure: A-Flutter Ablation;  Surgeon: Evans Lance, MD;  Location: [place] [facility];  Service: Cardiovascular;  Laterality: N/A;  . APPENDECTOMY    . BACK SURGERY    . EP IMPLANTABLE DEVICE N/A 11/11/2015   Procedure: BiVi Upgrade;  Surgeon: Evans Lance, MD;  Location: [place] [facility];  Service: Cardiovascular;  Laterality: N/A;  . EP IMPLANTABLE DEVICE N/A 09/15/2015   Procedure: BiV ICD Insertion CRT-D;  Surgeon: Will Meredith Leeds, MD;  Location: [place] [facility];  Service: Cardiovascular;  Laterality: N/A;  . PARTIAL HYSTERECTOMY    . REPLACEMENT TOTAL KNEE    . TOTAL HIP ARTHROPLASTY Right 08/25/2019   Procedure: TOTAL HIP ARTHROPLASTY ANTERIOR APPROACH;  Surgeon: Paralee Cancel, MD;  [facility];  Service: Orthopedics;  Laterality: Right;  Marland Kitchen VESICOVAGINAL FISTULA CLOSURE W/ TAH      There were no vitals filed for this visit.   Subjective Assessment - 11/03/19 1018    Subjective Doing good today. Was sore from last session, but rested then was fine    Currently in Pain? No/denies                              [facility] Adult PT Treatment/Exercise - 11/03/19 0001      Knee/Hip Exercises: Aerobic   Recumbent Bike L0 x 5 min    Nustep L3 no UE x 3 min       Knee/Hip Exercises: Machines for Strengthening   Cybex Knee Extension 5lb 2x10     Cybex Knee Flexion 20lb 2x10       Knee/Hip Exercises: Seated   Long Arc Quad Right;2 sets;10 reps    Long Arc Quad Weight 2 lbs.    Ball Squeeze 2x10 3sec hold    Hamstring Curl Right;2 sets;15 reps    Hamstring Limitations red tband    Sit to Sand 2 sets;10 reps;5 reps;with UE support                    PT Short Term Goals - 10/27/19 1100      PT SHORT TERM GOAL #1   Title independent with initial HEP    Status Achieved             PT Long Term Goals - 11/03/19 1053      PT LONG TERM GOAL #1   Title TUG with SPC 25 seconds    Status On-going  PT LONG TERM GOAL #2   Title increase Berg to 42/56      PT LONG TERM GOAL #3   Status On-going                 Plan - 11/03/19 1054    Clinical Impression Statement Pt did well today, backed down some intervention due to pt reports of being sore. Some difficulty and weakness noted with step up, needing seated rest between sets. Cues needed to complete full ROM with curls and ext.    Stability/Clinical Decision Making Stable/Uncomplicated    Rehab Potential Good    PT Frequency 2x / week    PT Duration 8 weeks    PT Treatment/Interventions Gait training;Neuromuscular re-education;Balance training;Therapeutic exercise;Therapeutic activities;Functional mobility training;Stair training;Patient/family education;Manual techniques    PT Next Visit Plan slowly advance strength and balance           Patient will benefit from skilled therapeutic intervention in order to improve the following deficits and impairments:  Abnormal gait, Decreased range of motion, Difficulty walking, Cardiopulmonary status limiting activity, Decreased activity  tolerance, Decreased balance, Decreased mobility, Decreased strength  Visit Diagnosis: Unsteady gait when walking  Other abnormalities of gait and mobility  Difficulty in walking, not elsewhere classified     Problem List Patient Active Problem List   Diagnosis Date Noted  . Hip fracture ([place]) 08/24/2019  . PAF (paroxysmal atrial fibrillation) ([place]) 08/24/2019  . DNR (do not resuscitate) 08/24/2019  . ICD BV  09/15/2015:  St. Jude Medical [place] model CD3357-40Q (serial  Number [id number]) in situ 07/23/2018  . Encounter for management of biventricular implantable cardioverter-defibrillator (ICD) 07/23/2018  . Type 2 diabetes mellitus with hyperglycemia ([place]) 08/31/2016  . CKD (chronic kidney disease) stage 3, GFR 30-59 ml/min 08/14/2016  . Mixed dyslipidemia 08/14/2016  . Vitamin D insufficiency 08/14/2016  . Typical atrial flutter ([place]) 07/30/2016  . Atrial flutter ([place]) 07/30/2016  . Chronic systolic heart failure ([place]) 11/11/2015  . CHF (congestive heart failure) ([place]) 09/15/2015  . Chronic systolic congestive heart failure ([place])   . Dyspnea 08/20/2014  . Abnormal CT scan, chest 08/20/2014  . Essential (primary) hypertension 08/20/2014  . Abnormal computed tomography scan 08/20/2014  . Encounter to establish care 08/09/2014  . Type 2 diabetes mellitus without complication ([place]) 16/96/7893    Scot Jun, PTA 11/03/2019, 10:56 AM  [place]. [place], [place], [address] Phone: [phone]   Fax:  [phone]  Name: Janet Owen MRN: [id number] Date of Birth: 02-17-1933

## 2019-11-05 ENCOUNTER — Other Ambulatory Visit: Payer: Self-pay

## 2019-11-05 ENCOUNTER — Encounter: Payer: Self-pay | Admitting: Physical Therapy

## 2019-11-05 ENCOUNTER — Ambulatory Visit: Payer: Medicare Other | Admitting: Physical Therapy

## 2019-11-05 DIAGNOSIS — R2689 Other abnormalities of gait and mobility: Secondary | ICD-10-CM

## 2019-11-05 DIAGNOSIS — R262 Difficulty in walking, not elsewhere classified: Secondary | ICD-10-CM

## 2019-11-05 DIAGNOSIS — R2681 Unsteadiness on feet: Secondary | ICD-10-CM

## 2019-11-05 NOTE — Therapy (Signed)
[place]. [place], [place], [address] Phone: [phone]   Fax:  [phone]  Physical Therapy Treatment  Patient Details  Name: Janet Owen MRN: [id number] Date of Birth: 07-Apr-1933 Referring Provider (PT): Alvan Dame   Encounter Date: 11/05/2019   PT End of Session - 11/05/19 1052    Date for PT Re-Evaluation 11/20/19    PT Start Time 8466    PT Stop Time 1055    PT Time Calculation (min) 40 min    Activity Tolerance Patient tolerated treatment well    Behavior During Therapy [facility] for tasks assessed/performed           Past Medical History:  Diagnosis Date  . Atrial fibrillation ([place])   . DM (diabetes mellitus) ([place])   . Heart failure ([place])   . Hypertension     Past Surgical History:  Procedure Laterality Date  . A-FLUTTER ABLATION N/A 07/30/2016   Procedure: A-Flutter Ablation;  Surgeon: Evans Lance, MD;  Location: [place] [facility];  Service: Cardiovascular;  Laterality: N/A;  . APPENDECTOMY    . BACK SURGERY    . EP IMPLANTABLE DEVICE N/A 11/11/2015   Procedure: BiVi Upgrade;  Surgeon: Evans Lance, MD;  Location: [place] [facility];  Service: Cardiovascular;  Laterality: N/A;  . EP IMPLANTABLE DEVICE N/A 09/15/2015   Procedure: BiV ICD Insertion CRT-D;  Surgeon: Will Meredith Leeds, MD;  Location: [place] [facility];  Service: Cardiovascular;  Laterality: N/A;  . PARTIAL HYSTERECTOMY    . REPLACEMENT TOTAL KNEE    . TOTAL HIP ARTHROPLASTY Right 08/25/2019   Procedure: TOTAL HIP ARTHROPLASTY ANTERIOR APPROACH;  Surgeon: Paralee Cancel, MD;  [facility];  Service: Orthopedics;  Laterality: Right;  Marland Kitchen VESICOVAGINAL FISTULA CLOSURE W/ TAH      There were no vitals filed for this visit.   Subjective Assessment - 11/05/19 1017    Subjective "I am doing better today"    Currently in Pain? No/denies              [facility] PT Assessment - 11/05/19 0001      Berg Balance Test   Sit to  Stand Able to stand  independently using hands    Standing Unsupported Able to stand safely 2 minutes    Sitting with Back Unsupported but Feet Supported on Floor or Stool Able to sit safely and securely 2 minutes    Stand to Sit Sits safely with minimal use of hands    Transfers Able to transfer safely, definite need of hands    Standing Unsupported with Eyes Closed Able to stand 10 seconds with supervision    Standing Unsupported with Feet Together Able to place feet together independently and stand for 1 minute with supervision    From Standing, Reach Forward with Outstretched Arm Can reach forward >12 cm safely (5")    From Standing Position, Pick up Object from Floor Able to pick up shoe, needs supervision    From Standing Position, Turn to Look Behind Over each Shoulder Looks behind one side only/other side shows less weight shift    Turn 360 Degrees Able to turn 360 degrees safely but slowly    Standing Unsupported, Alternately Place Feet on Step/Stool Able to complete >2 steps/needs minimal assist    Standing Unsupported, One Foot in Front Able to take small step independently and hold 30 seconds    Standing on One Leg Tries to lift leg/unable to hold  3 seconds but remains standing independently    Total Score 39      Timed Up and Go Test   TUG Normal TUG    Normal TUG (seconds) 16.01    TUG Comments No AD                          [facility] Adult PT Treatment/Exercise - 11/05/19 0001      Knee/Hip Exercises: Aerobic   Recumbent Bike L0 x 5 min    Nustep L3 no UE x 3 min       Knee/Hip Exercises: Machines for Strengthening   Cybex Knee Extension 10lb 2x10     Cybex Knee Flexion 20lb 2x10                     PT Short Term Goals - 10/27/19 1100      PT SHORT TERM GOAL #1   Title independent with initial HEP    Status Achieved             PT Long Term Goals - 11/05/19 1045      PT LONG TERM GOAL #2   Title increase Berg to 42/56    Status  On-going   39     PT LONG TERM GOAL #3   Title independent with advanced HEP for strength and balance    Status Partially Met      PT LONG TERM GOAL #4   Title walk 250 feet with a SPC    Status Partially Met                 Plan - 11/05/19 1052    Clinical Impression Statement Pt has progressed decreasing her TUG time meeting goal. She has also made progress increasing her BERG balance score. Difficulty with alt box taps and single leg stance. She is unable to stand without UE assist but able to lower without UEs. LE fatigue noted with the seated leg extension using increase weight.    Stability/Clinical Decision Making Stable/Uncomplicated    Rehab Potential Good    PT Frequency 2x / week    PT Duration 8 weeks    PT Treatment/Interventions Gait training;Neuromuscular re-education;Balance training;Therapeutic exercise;Therapeutic activities;Functional mobility training;Stair training;Patient/family education;Manual techniques    PT Next Visit Plan slowly advance strength and balance           Patient will benefit from skilled therapeutic intervention in order to improve the following deficits and impairments:  Abnormal gait, Decreased range of motion, Difficulty walking, Cardiopulmonary status limiting activity, Decreased activity tolerance, Decreased balance, Decreased mobility, Decreased strength  Visit Diagnosis: Unsteady gait when walking  Other abnormalities of gait and mobility  Difficulty in walking, not elsewhere classified     Problem List Patient Active Problem List   Diagnosis Date Noted  . Hip fracture ([place]) 08/24/2019  . PAF (paroxysmal atrial fibrillation) ([place]) 08/24/2019  . DNR (do not resuscitate) 08/24/2019  . ICD BV  09/15/2015:  St. Jude Medical [place] model CD3357-40Q (serial  Number [id number]) in situ 07/23/2018  . Encounter for management of biventricular implantable cardioverter-defibrillator (ICD) 07/23/2018  . Type 2 diabetes mellitus  with hyperglycemia ([place]) 08/31/2016  . CKD (chronic kidney disease) stage 3, GFR 30-59 ml/min 08/14/2016  . Mixed dyslipidemia 08/14/2016  . Vitamin D insufficiency 08/14/2016  . Typical atrial flutter ([place]) 07/30/2016  . Atrial flutter ([place]) 07/30/2016  . Chronic systolic heart failure ([place]) 11/11/2015  .  CHF (congestive heart failure) ([place]) 09/15/2015  . Chronic systolic congestive heart failure ([place])   . Dyspnea 08/20/2014  . Abnormal CT scan, chest 08/20/2014  . Essential (primary) hypertension 08/20/2014  . Abnormal computed tomography scan 08/20/2014  . Encounter to establish care 08/09/2014  . Type 2 diabetes mellitus without complication ([place]) 88/89/1694    Scot Jun, PTA 11/05/2019, 10:54 AM  [place]. [place], [place], [address] Phone: [phone]   Fax:  [phone]  Name: Janet Owen MRN: [id number] Date of Birth: Jun 13, 1933

## 2019-11-10 ENCOUNTER — Encounter: Payer: Self-pay | Admitting: Physical Therapy

## 2019-11-10 ENCOUNTER — Ambulatory Visit: Payer: Medicare Other | Admitting: Physical Therapy

## 2019-11-10 ENCOUNTER — Other Ambulatory Visit: Payer: Self-pay

## 2019-11-10 DIAGNOSIS — R262 Difficulty in walking, not elsewhere classified: Secondary | ICD-10-CM

## 2019-11-10 DIAGNOSIS — R2689 Other abnormalities of gait and mobility: Secondary | ICD-10-CM

## 2019-11-10 DIAGNOSIS — R2681 Unsteadiness on feet: Secondary | ICD-10-CM

## 2019-11-10 NOTE — Therapy (Signed)
[place]. [place], [place], [address] Phone: [phone]   Fax:  [phone]  Physical Therapy Treatment  Patient Details  Name: Janet Owen MRN: [id number] Date of Birth: Feb 04, 1934 Referring Provider (PT): Cathe Mons Date: 11/10/2019   PT End of Session - 11/10/19 1053    Visit Number 5    Date for PT Re-Evaluation 11/20/19    PT Start Time 1010    PT Stop Time 1053    PT Time Calculation (min) 43 min    Activity Tolerance Patient tolerated treatment well    Behavior During Therapy [facility] for tasks assessed/performed           Past Medical History:  Diagnosis Date  . Atrial fibrillation ([place])   . DM (diabetes mellitus) ([place])   . Heart failure ([place])   . Hypertension     Past Surgical History:  Procedure Laterality Date  . A-FLUTTER ABLATION N/A 07/30/2016   Procedure: A-Flutter Ablation;  Surgeon: Evans Lance, MD;  Location: [place] [facility];  Service: Cardiovascular;  Laterality: N/A;  . APPENDECTOMY    . BACK SURGERY    . EP IMPLANTABLE DEVICE N/A 11/11/2015   Procedure: BiVi Upgrade;  Surgeon: Evans Lance, MD;  Location: [place] [facility];  Service: Cardiovascular;  Laterality: N/A;  . EP IMPLANTABLE DEVICE N/A 09/15/2015   Procedure: BiV ICD Insertion CRT-D;  Surgeon: Will Meredith Leeds, MD;  Location: [place] [facility];  Service: Cardiovascular;  Laterality: N/A;  . PARTIAL HYSTERECTOMY    . REPLACEMENT TOTAL KNEE    . TOTAL HIP ARTHROPLASTY Right 08/25/2019   Procedure: TOTAL HIP ARTHROPLASTY ANTERIOR APPROACH;  Surgeon: Paralee Cancel, MD;  [facility];  Service: Orthopedics;  Laterality: Right;  Marland Kitchen VESICOVAGINAL FISTULA CLOSURE W/ TAH      There were no vitals filed for this visit.   Subjective Assessment - 11/10/19 1014    Subjective I am feeling stronger, I am walking some without walker    Currently in Pain? No/denies                              [facility] Adult PT Treatment/Exercise - 11/10/19 0001      High Level Balance   High Level Balance Comments practice on stairs one at a time.  Tried standing on airex, really could not let go with this, ball toss standing on solid surface, gait with SPC and HHA walking out in the uneven surface, grass, gravel, curb negotiatiing      Knee/Hip Exercises: Aerobic   Recumbent Bike L0 x 5 min    Nustep level 4 x 6 minutes      Knee/Hip Exercises: Machines for Strengthening   Cybex Knee Extension 10lb 2x10     Cybex Knee Flexion 20lb 2x10     Cybex Leg Press 20lb 2x10                    PT Short Term Goals - 10/27/19 1100      PT SHORT TERM GOAL #1   Title independent with initial HEP    Status Achieved             PT Long Term Goals - 11/05/19 1045      PT LONG TERM GOAL #2   Title increase Berg to 42/56    Status On-going   39  PT LONG TERM GOAL #3   Title independent with advanced HEP for strength and balance    Status Partially Met      PT LONG TERM GOAL #4   Title walk 250 feet with a SPC    Status Partially Met                 Plan - 11/10/19 1054    Clinical Impression Statement Patient overall is doing better, reports that she is feeling stronger and is able to walk better, she really still has a terrible time on dynamic surfaces, unable to let go while on airex and needed HHA walking in the grass    PT Next Visit Plan try to advance balance as tolerated    Consulted and Agree with Plan of Care Patient           Patient will benefit from skilled therapeutic intervention in order to improve the following deficits and impairments:  Abnormal gait, Decreased range of motion, Difficulty walking, Cardiopulmonary status limiting activity, Decreased activity tolerance, Decreased balance, Decreased mobility, Decreased strength  Visit Diagnosis: Unsteady gait when walking  Other abnormalities of gait and  mobility  Difficulty in walking, not elsewhere classified     Problem List Patient Active Problem List   Diagnosis Date Noted  . Hip fracture ([place]) 08/24/2019  . PAF (paroxysmal atrial fibrillation) ([place]) 08/24/2019  . DNR (do not resuscitate) 08/24/2019  . ICD BV  09/15/2015:  St. Jude Medical [place] model CD3357-40Q (serial  Number [id number]) in situ 07/23/2018  . Encounter for management of biventricular implantable cardioverter-defibrillator (ICD) 07/23/2018  . Type 2 diabetes mellitus with hyperglycemia ([place]) 08/31/2016  . CKD (chronic kidney disease) stage 3, GFR 30-59 ml/min 08/14/2016  . Mixed dyslipidemia 08/14/2016  . Vitamin D insufficiency 08/14/2016  . Typical atrial flutter ([place]) 07/30/2016  . Atrial flutter ([place]) 07/30/2016  . Chronic systolic heart failure ([place]) 11/11/2015  . CHF (congestive heart failure) ([place]) 09/15/2015  . Chronic systolic congestive heart failure ([place])   . Dyspnea 08/20/2014  . Abnormal CT scan, chest 08/20/2014  . Essential (primary) hypertension 08/20/2014  . Abnormal computed tomography scan 08/20/2014  . Encounter to establish care 08/09/2014  . Type 2 diabetes mellitus without complication ([place]) 23/53/6144    Sumner Boast., PT 11/10/2019, 10:56 AM  [place]. [place], [place], [address] Phone: [phone]   Fax:  [phone]  Name: Janet Owen MRN: [id number] Date of Birth: [birth year]

## 2019-11-12 ENCOUNTER — Ambulatory Visit: Payer: Medicare Other | Admitting: Physical Therapy

## 2019-11-16 ENCOUNTER — Ambulatory Visit: Payer: Medicare Other | Attending: Orthopedic Surgery | Admitting: Physical Therapy

## 2019-11-16 ENCOUNTER — Other Ambulatory Visit: Payer: Self-pay

## 2019-11-16 ENCOUNTER — Encounter: Payer: Self-pay | Admitting: Physical Therapy

## 2019-11-16 DIAGNOSIS — R2689 Other abnormalities of gait and mobility: Secondary | ICD-10-CM | POA: Diagnosis present

## 2019-11-16 DIAGNOSIS — R2681 Unsteadiness on feet: Secondary | ICD-10-CM | POA: Insufficient documentation

## 2019-11-16 DIAGNOSIS — R262 Difficulty in walking, not elsewhere classified: Secondary | ICD-10-CM | POA: Insufficient documentation

## 2019-11-16 NOTE — Therapy (Signed)
[facility] [address] [place], [place], [address] Phone: [phone]   Fax:  [phone]  Physical Therapy Treatment  Patient Details  Name: Janet Owen MRN: [id number] Date of Birth: 10/20/1933 Referring Provider (PT): Olin   Encounter Date: 11/16/2019   PT End of Session - 11/16/19 1342    Visit Number 6    Date for PT Re-Evaluation 11/20/19    PT Start Time 1300    PT Stop Time 1342    PT Time Calculation (min) 42 min    Activity Tolerance Patient tolerated treatment well    Behavior During Therapy [facility] for tasks assessed/performed           Past Medical History:  Diagnosis Date  . Atrial fibrillation (HCC)   . DM (diabetes mellitus) (HCC)   . Heart failure (HCC)   . Hypertension     Past Surgical History:  Procedure Laterality Date  . A-FLUTTER ABLATION N/A 07/30/2016   Procedure: A-Flutter Ablation;  Surgeon: Taylor, Gregg W, MD;  [facility];  Service: Cardiovascular;  Laterality: N/A;  . APPENDECTOMY    . BACK SURGERY    . EP IMPLANTABLE DEVICE N/A 11/11/2015   Procedure: BiVi Upgrade;  Surgeon: Gregg W Taylor, MD;  [facility]: [facility];  Service: Cardiovascular;  Laterality: N/A;  . EP IMPLANTABLE DEVICE N/A 09/15/2015   Procedure: BiV ICD Insertion CRT-D;  Surgeon: Will Martin Camnitz, MD;  [facility];  Service: Cardiovascular;  Laterality: N/A;  . PARTIAL HYSTERECTOMY    . REPLACEMENT TOTAL KNEE    . TOTAL HIP ARTHROPLASTY Right 08/25/2019   Procedure: TOTAL HIP ARTHROPLASTY ANTERIOR APPROACH;  Surgeon: Olin, Matthew, MD;  [facility];  Service: Orthopedics;  Laterality: Right;  . VESICOVAGINAL FISTULA CLOSURE W/ TAH      There were no vitals filed for this visit.   Subjective Assessment - 11/16/19 1301    Subjective "I am doing good"    Currently in Pain? No/denies                             [facility] Adult PT Treatment/Exercise  - 11/16/19 0001      High Level Balance   High Level Balance Comments gait with and  walking out in the uneven surface, grass, gravel, curb negotiatiing HHA x1 needed for curb; Side step over small objects HHA x 1       Knee/Hip Exercises: Aerobic   Recumbent Bike L0 x 5 min    Nustep level 4 x 4 minutes LE only       Knee/Hip Exercises: Machines for Strengthening   Cybex Knee Extension 10lb 2x10     Cybex Knee Flexion 20lb 2x10     Cybex Leg Press 20lb 2x10      Knee/Hip Exercises: Standing   Other Standing Knee Exercises Alt 6in box taps HHA x1 2x10     Other Standing Knee Exercises AL 4in box taps with SPC                    PT Short Term Goals - 10/27/19 1100      PT SHORT TERM GOAL #1   Title independent with initial HEP    Status Achieved             PT Long Term Goals - 11/05/19 1045      PT LONG   TERM GOAL #2   Title increase Berg to 42/56    Status On-going   39     PT LONG TERM GOAL #3   Title independent with advanced HEP for strength and balance    Status Partially Met      PT LONG TERM GOAL #4   Title walk 250 feet with a SPC    Status Partially Met                 Plan - 11/16/19 1342    Clinical Impression Statement Pt did well with machine level interventions. She doe have difficulty with step activities and negotiating curb. Very afraid to try box tap without HHA, but was able to complete with SPC    Stability/Clinical Decision Making Stable/Uncomplicated    Rehab Potential Good    PT Frequency 2x / week    PT Duration 8 weeks    PT Treatment/Interventions Gait training;Neuromuscular re-education;Balance training;Therapeutic exercise;Therapeutic activities;Functional mobility training;Stair training;Patient/family education;Manual techniques    PT Next Visit Plan try to advance balance as tolerated           Patient will benefit from skilled therapeutic intervention in order to improve the following deficits and impairments:   Abnormal gait, Decreased range of motion, Difficulty walking, Cardiopulmonary status limiting activity, Decreased activity tolerance, Decreased balance, Decreased mobility, Decreased strength  Visit Diagnosis: Difficulty in walking, not elsewhere classified  Other abnormalities of gait and mobility  Unsteady gait when walking     Problem List Patient Active Problem List   Diagnosis Date Noted  . Hip fracture (HCC) 08/24/2019  . PAF (paroxysmal atrial fibrillation) (HCC) 08/24/2019  . DNR (do not resuscitate) 08/24/2019  . ICD BV  09/15/2015:  St. Jude Medical [place] model CD3357-40Q (serial  Number [id number]) in situ 07/23/2018  . Encounter for management of biventricular implantable cardioverter-defibrillator (ICD) 07/23/2018  . Type 2 diabetes mellitus with hyperglycemia (HCC) 08/31/2016  . CKD (chronic kidney disease) stage 3, GFR 30-59 ml/min 08/14/2016  . Mixed dyslipidemia 08/14/2016  . Vitamin D insufficiency 08/14/2016  . Typical atrial flutter (HCC) 07/30/2016  . Atrial flutter (HCC) 07/30/2016  . Chronic systolic heart failure (HCC) 11/11/2015  . CHF (congestive heart failure) (HCC) 09/15/2015  . Chronic systolic congestive heart failure (HCC)   . Dyspnea 08/20/2014  . Abnormal CT scan, chest 08/20/2014  . Essential (primary) hypertension 08/20/2014  . Abnormal computed tomography scan 08/20/2014  . Encounter to establish care 08/09/2014  . Type 2 diabetes mellitus without complication (HCC) 08/09/2014    Ronald G Pemberton, PTA 11/16/2019, 1:44 PM  [facility] [address] [place], [place], [address] Phone: [phone]   Fax:  [phone]  Name: Janet Owen MRN: [id number] Date of Birth: 08/29/1933   

## 2019-11-17 ENCOUNTER — Ambulatory Visit: Payer: Medicare Other | Attending: Internal Medicine

## 2019-11-17 ENCOUNTER — Encounter: Payer: Medicare Other | Admitting: Physical Therapy

## 2019-11-17 DIAGNOSIS — Z23 Encounter for immunization: Secondary | ICD-10-CM

## 2019-11-17 NOTE — Progress Notes (Signed)
   Covid-19 Vaccination Clinic  Name:  Janet Owen    MRN: [id number] DOB: 08-10-1933  11/17/2019  [name] was observed post Covid-19 immunization for 15 minutes without incident. She was provided with Vaccine Information Sheet and instruction to access the V-Safe system.   [name] was instructed to call 911 with any severe reactions post vaccine: Marland Kitchen Difficulty breathing  . Swelling of face and throat  . A fast heartbeat  . A bad rash all over body  . Dizziness and weakness

## 2019-11-24 ENCOUNTER — Other Ambulatory Visit: Payer: Self-pay

## 2019-11-24 ENCOUNTER — Encounter: Payer: Self-pay | Admitting: Physical Therapy

## 2019-11-24 ENCOUNTER — Ambulatory Visit: Payer: Medicare Other | Admitting: Physical Therapy

## 2019-11-24 DIAGNOSIS — R2689 Other abnormalities of gait and mobility: Secondary | ICD-10-CM

## 2019-11-24 DIAGNOSIS — R262 Difficulty in walking, not elsewhere classified: Secondary | ICD-10-CM | POA: Diagnosis not present

## 2019-11-24 DIAGNOSIS — R2681 Unsteadiness on feet: Secondary | ICD-10-CM

## 2019-11-24 NOTE — Therapy (Signed)
[place]. [place], [place], [address] Phone: [phone]   Fax:  [phone]  Physical Therapy Treatment  Patient Details  Name: Janet Owen MRN: [id number] Date of Birth: 1934/02/07 Referring Provider (PT): Cathe Mons Date: 11/24/2019   PT End of Session - 11/24/19 0931    Visit Number 7    Date for PT Re-Evaluation 11/20/19    PT Start Time 1309    PT Stop Time 1351    PT Time Calculation (min) 42 min    Activity Tolerance Patient tolerated treatment well    Behavior During Therapy [facility] for tasks assessed/performed           Past Medical History:  Diagnosis Date  . Atrial fibrillation ([place])   . DM (diabetes mellitus) ([place])   . Heart failure ([place])   . Hypertension     Past Surgical History:  Procedure Laterality Date  . A-FLUTTER ABLATION N/A 07/30/2016   Procedure: A-Flutter Ablation;  Surgeon: Evans Lance, MD;  Location: [place] [facility];  Service: Cardiovascular;  Laterality: N/A;  . APPENDECTOMY    . BACK SURGERY    . EP IMPLANTABLE DEVICE N/A 11/11/2015   Procedure: BiVi Upgrade;  Surgeon: Evans Lance, MD;  Location: [facility] [facility];  Service: Cardiovascular;  Laterality: N/A;  . EP IMPLANTABLE DEVICE N/A 09/15/2015   Procedure: BiV ICD Insertion CRT-D;  Surgeon: Will Meredith Leeds, MD;  Location: [place] [facility];  Service: Cardiovascular;  Laterality: N/A;  . PARTIAL HYSTERECTOMY    . REPLACEMENT TOTAL KNEE    . TOTAL HIP ARTHROPLASTY Right 08/25/2019   Procedure: TOTAL HIP ARTHROPLASTY ANTERIOR APPROACH;  Surgeon: Paralee Cancel, MD;  [facility];  Service: Orthopedics;  Laterality: Right;  Marland Kitchen VESICOVAGINAL FISTULA CLOSURE W/ TAH      There were no vitals filed for this visit.   Subjective Assessment - 11/24/19 1312    Subjective Doing pretty good, reports still fearful of walking outside and curbs    Currently in Pain? No/denies                              [facility] Adult PT Treatment/Exercise - 11/24/19 0001      High Level Balance   High Level Balance Activities Side stepping;Backward walking    High Level Balance Comments ball toss,       Knee/Hip Exercises: Aerobic   Recumbent Bike L0 x 5 min    Nustep level 4 x 4 minutes LE only       Knee/Hip Exercises: Standing   Other Standing Knee Exercises AL 4in box taps with SPC, could not do a 4" step up due to freezing, able to do 2" step up and down but again needed close CGA                    PT Short Term Goals - 10/27/19 1100      PT SHORT TERM GOAL #1   Title independent with initial HEP    Status Achieved             PT Long Term Goals - 11/24/19 1446      PT LONG TERM GOAL #1   Title TUG with SPC 25 seconds    Status Achieved      PT LONG TERM GOAL #2   Title increase Berg to 42/56  Status On-going      PT LONG TERM GOAL #3   Title independent with advanced HEP for strength and balance    Status Partially Met      PT LONG TERM GOAL #4   Title walk 250 feet with a SPC    Status Partially Met                 Plan - 11/24/19 1443    Clinical Impression Statement Continues to really struggle with curbs and balance, it really seems to be a mental thing as she freezes and cannot lift her leg up to iniate the movement.  She did better some outside walking with the use of the cane.  2" and 4" step ups were very difficult and then just trying to stand on one leg    PT Next Visit Plan try to advance balance as tolerated    Consulted and Agree with Plan of Care Patient           Patient will benefit from skilled therapeutic intervention in order to improve the following deficits and impairments:  Abnormal gait, Decreased range of motion, Difficulty walking, Cardiopulmonary status limiting activity, Decreased activity tolerance, Decreased balance, Decreased mobility, Decreased strength  Visit Diagnosis: Difficulty  in walking, not elsewhere classified  Other abnormalities of gait and mobility  Unsteady gait when walking     Problem List Patient Active Problem List   Diagnosis Date Noted  . Hip fracture ([place]) 08/24/2019  . PAF (paroxysmal atrial fibrillation) ([place]) 08/24/2019  . DNR (do not resuscitate) 08/24/2019  . ICD BV  09/15/2015:  St. Jude [employer] [place] model CD3357-40Q (serial  Number [id number]) in situ 07/23/2018  . Encounter for management of biventricular implantable cardioverter-defibrillator (ICD) 07/23/2018  . Type 2 diabetes mellitus with hyperglycemia ([place]) 08/31/2016  . CKD (chronic kidney disease) stage 3, GFR 30-59 ml/min 08/14/2016  . Mixed dyslipidemia 08/14/2016  . Vitamin D insufficiency 08/14/2016  . Typical atrial flutter ([place]) 07/30/2016  . Atrial flutter ([place]) 07/30/2016  . Chronic systolic heart failure ([place]) 11/11/2015  . CHF (congestive heart failure) ([place]) 09/15/2015  . Chronic systolic congestive heart failure ([place])   . Dyspnea 08/20/2014  . Abnormal CT scan, chest 08/20/2014  . Essential (primary) hypertension 08/20/2014  . Abnormal computed tomography scan 08/20/2014  . Encounter to establish care 08/09/2014  . Type 2 diabetes mellitus without complication ([place]) 13/88/7195    Sumner Boast., PT 11/24/2019, 2:47 PM  [place]. [place], [place], [address] Phone: [phone]   Fax:  [phone]  Name: Janet Owen MRN: [id number] Date of Birth: 1933/05/18

## 2019-11-30 ENCOUNTER — Ambulatory Visit: Payer: Medicare Other | Admitting: Cardiology

## 2019-12-01 ENCOUNTER — Other Ambulatory Visit: Payer: Self-pay

## 2019-12-01 ENCOUNTER — Ambulatory Visit: Payer: Medicare Other | Admitting: Physical Therapy

## 2019-12-01 ENCOUNTER — Encounter: Payer: Self-pay | Admitting: Physical Therapy

## 2019-12-01 DIAGNOSIS — R262 Difficulty in walking, not elsewhere classified: Secondary | ICD-10-CM

## 2019-12-01 DIAGNOSIS — R2689 Other abnormalities of gait and mobility: Secondary | ICD-10-CM

## 2019-12-01 DIAGNOSIS — R2681 Unsteadiness on feet: Secondary | ICD-10-CM

## 2019-12-01 NOTE — Therapy (Signed)
[place]. [place], [place], [address] Phone: [phone]   Fax:  [phone]  Physical Therapy Treatment  Patient Details  Name: Janet Owen MRN: [id number] Date of Birth: [birth year] Referring Provider (PT): Cathe Mons Date: 12/01/2019   PT End of Session - 12/01/19 1102    Visit Number 8    Date for PT Re-Evaluation 12/22/19    PT Start Time 1006    PT Stop Time 1052    PT Time Calculation (min) 46 min    Activity Tolerance Patient tolerated treatment well    Behavior During Therapy [facility] for tasks assessed/performed           Past Medical History:  Diagnosis Date  . Atrial fibrillation ([place])   . DM (diabetes mellitus) ([place])   . Heart failure ([place])   . Hypertension     Past Surgical History:  Procedure Laterality Date  . A-FLUTTER ABLATION N/A 07/30/2016   Procedure: A-Flutter Ablation;  Surgeon: Evans Lance, MD;  Location: [place] [facility];  Service: Cardiovascular;  Laterality: N/A;  . APPENDECTOMY    . BACK SURGERY    . EP IMPLANTABLE DEVICE N/A 11/11/2015   Procedure: BiVi Upgrade;  Surgeon: Evans Lance, MD;  Location: [place] [facility];  Service: Cardiovascular;  Laterality: N/A;  . EP IMPLANTABLE DEVICE N/A 09/15/2015   Procedure: BiV ICD Insertion CRT-D;  Surgeon: Will Meredith Leeds, MD;  Location: [place] [facility];  Service: Cardiovascular;  Laterality: N/A;  . PARTIAL HYSTERECTOMY    . REPLACEMENT TOTAL KNEE    . TOTAL HIP ARTHROPLASTY Right 08/25/2019   Procedure: TOTAL HIP ARTHROPLASTY ANTERIOR APPROACH;  Surgeon: Paralee Cancel, MD;  [facility];  Service: Orthopedics;  Laterality: Right;  Marland Kitchen VESICOVAGINAL FISTULA CLOSURE W/ TAH      There were no vitals filed for this visit.   Subjective Assessment - 12/01/19 1010    Subjective I still have trouble stepping up, and over things, and on uneven stuff    Currently in Pain? No/denies                              [facility] Adult PT Treatment/Exercise - 12/01/19 0001      Ambulation/Gait   Gait Comments outside gait working on curb negotiation, then around the building in the back, slopes up and down      High Level Balance   High Level Balance Comments standing on airex two way scap stabilization, tried her with one foot on the airex and she was not able, went to a 4" step and had her prop a foot, and just tried balance      Knee/Hip Exercises: Aerobic   Nustep level 5 x 6 minutes       Knee/Hip Exercises: Machines for Strengthening   Cybex Knee Extension 10lb 2x10     Cybex Knee Flexion 20lb 2x10     Cybex Leg Press 20lb 2x10                    PT Short Term Goals - 10/27/19 1100      PT SHORT TERM GOAL #1   Title independent with initial HEP    Status Achieved             PT Long Term Goals - 12/01/19 1132      PT LONG TERM GOAL #4  Title walk 250 feet with a SPC    Status Achieved                 Plan - 12/01/19 1104    Clinical Impression Statement Patient continues to do better, her issue is on curbs, step ups or step overs she really struggles, the first curb she took about 30 seconds to do it due to fear and hesitation.  After the first time the next few time it would take about 10 seconds to initiate the motion to negotiate the curb.  She also cannot stnad alone on the dynamic surfaces    PT Next Visit Plan try to advance balance as tolerated    Consulted and Agree with Plan of Care Patient           Patient will benefit from skilled therapeutic intervention in order to improve the following deficits and impairments:  Abnormal gait, Decreased range of motion, Difficulty walking, Cardiopulmonary status limiting activity, Decreased activity tolerance, Decreased balance, Decreased mobility, Decreased strength  Visit Diagnosis: Difficulty in walking, not elsewhere classified  Other abnormalities of gait and  mobility  Unsteady gait when walking     Problem List Patient Active Problem List   Diagnosis Date Noted  . Hip fracture ([place]) 08/24/2019  . PAF (paroxysmal atrial fibrillation) ([place]) 08/24/2019  . DNR (do not resuscitate) 08/24/2019  . ICD BV  09/15/2015:  St. Jude Medical [place] model CD3357-40Q (serial  Number [id number]) in situ 07/23/2018  . Encounter for management of biventricular implantable cardioverter-defibrillator (ICD) 07/23/2018  . Type 2 diabetes mellitus with hyperglycemia ([place]) 08/31/2016  . CKD (chronic kidney disease) stage 3, GFR 30-59 ml/min 08/14/2016  . Mixed dyslipidemia 08/14/2016  . Vitamin D insufficiency 08/14/2016  . Typical atrial flutter ([place]) 07/30/2016  . Atrial flutter ([place]) 07/30/2016  . Chronic systolic heart failure ([place]) 11/11/2015  . CHF (congestive heart failure) ([place]) 09/15/2015  . Chronic systolic congestive heart failure ([place])   . Dyspnea 08/20/2014  . Abnormal CT scan, chest 08/20/2014  . Essential (primary) hypertension 08/20/2014  . Abnormal computed tomography scan 08/20/2014  . Encounter to establish care 08/09/2014  . Type 2 diabetes mellitus without complication ([place]) 52/17/4715    Sumner Boast., PT 12/01/2019, 11:32 AM  [place]. [place], [place], [address] Phone: [phone]   Fax:  [phone]  Name: Janet Owen MRN: [id number] Date of Birth: 03-Aug-1933

## 2019-12-07 ENCOUNTER — Other Ambulatory Visit: Payer: Self-pay

## 2019-12-07 ENCOUNTER — Ambulatory Visit: Payer: Medicare Other | Admitting: Physical Therapy

## 2019-12-07 ENCOUNTER — Encounter: Payer: Self-pay | Admitting: Physical Therapy

## 2019-12-07 DIAGNOSIS — R262 Difficulty in walking, not elsewhere classified: Secondary | ICD-10-CM

## 2019-12-07 DIAGNOSIS — R2681 Unsteadiness on feet: Secondary | ICD-10-CM

## 2019-12-07 DIAGNOSIS — R2689 Other abnormalities of gait and mobility: Secondary | ICD-10-CM

## 2019-12-07 NOTE — Therapy (Signed)
[place]. [place], [place], [address] Phone: [phone]   Fax:  [phone]  Physical Therapy Treatment  Patient Details  Name: Janet Owen MRN: [id number] Date of Birth: 1934/01/29 Referring Provider (PT): Cathe Mons Date: 12/07/2019   PT End of Session - 12/07/19 1343    Visit Number 9    Date for PT Re-Evaluation 12/22/19    PT Start Time 1300    PT Stop Time 1341    PT Time Calculation (min) 41 min    Activity Tolerance Patient tolerated treatment well    Behavior During Therapy [facility] for tasks assessed/performed           Past Medical History:  Diagnosis Date  . Atrial fibrillation ([place])   . DM (diabetes mellitus) ([place])   . Heart failure ([place])   . Hypertension     Past Surgical History:  Procedure Laterality Date  . A-FLUTTER ABLATION N/A 07/30/2016   Procedure: A-Flutter Ablation;  Surgeon: Evans Lance, MD;  Location: [place] [facility];  Service: Cardiovascular;  Laterality: N/A;  . APPENDECTOMY    . BACK SURGERY    . EP IMPLANTABLE DEVICE N/A 11/11/2015   Procedure: BiVi Upgrade;  Surgeon: Evans Lance, MD;  Location: [place] [facility];  Service: Cardiovascular;  Laterality: N/A;  . EP IMPLANTABLE DEVICE N/A 09/15/2015   Procedure: BiV ICD Insertion CRT-D;  Surgeon: Will Meredith Leeds, MD;  Location: [place];  Service: Cardiovascular;  Laterality: N/A;  . PARTIAL HYSTERECTOMY    . REPLACEMENT TOTAL KNEE    . TOTAL HIP ARTHROPLASTY Right 08/25/2019   Procedure: TOTAL HIP ARTHROPLASTY ANTERIOR APPROACH;  Surgeon: Paralee Cancel, MD;  [facility];  Service: Orthopedics;  Laterality: Right;  Marland Kitchen VESICOVAGINAL FISTULA CLOSURE W/ TAH      There were no vitals filed for this visit.   Subjective Assessment - 12/07/19 1323    Subjective feeling good today with no c/o pain.    Currently in Pain? No/denies                             [facility] Adult  PT Treatment/Exercise - 12/07/19 0001      Ambulation/Gait   Ambulation/Gait Yes    Ambulation/Gait Assistance 5: Supervision    Assistive device None    Ambulation Surface Level;Unlevel;Indoor;Outdoor;Paved    Curb 6: Modified independent (Device/increase time)    Gait Comments walking around outsie. Curbs, inclines, and declines. Fwrd trunk flexion at the hips, patient needed to look at the ground when transitioning surfaces,2 rest breaks required when walking on an incline, hesitation upon stepping up onto curbs approx. 10-20sec.        Neuro Re-ed    Neuro Re-ed Details  DL, tandem stance on even surfance and airex pad EO/EC, step up/downs onto airex      Knee/Hip Exercises: Aerobic   Nustep L 4/5 6 mins      Knee/Hip Exercises: Machines for Strengthening   Cybex Knee Extension #15 2x10     Cybex Knee Flexion #20 2x10                     PT Short Term Goals - 10/27/19 1100      PT SHORT TERM GOAL #1   Title independent with initial HEP    Status Achieved  PT Long Term Goals - 12/01/19 1132      PT LONG TERM GOAL #4   Title walk 250 feet with a SPC    Status Achieved                 Plan - 12/07/19 1344    Clinical Impression Statement Patient negotiated curbs, inclines, declines, and uneven surfaces outdoors. She was inconsistent with how long it took her to step up curbs due to fear and hesitation. Patient was tired and required a rest break upon finishing outdoor building lap because of fatigue and decrease cardiovascular enduance. Was able to confidently stand on even sufaces but really struggled to trust herself on airex pad.    PT Treatment/Interventions Gait training;Neuromuscular re-education;Balance training;Therapeutic exercise;Therapeutic activities;Functional mobility training;Stair training;Patient/family education;Manual techniques    PT Next Visit Plan continue to advance balance exercises on dynamic surfaces.            Patient will benefit from skilled therapeutic intervention in order to improve the following deficits and impairments:  Abnormal gait, Decreased range of motion, Difficulty walking, Cardiopulmonary status limiting activity, Decreased activity tolerance, Decreased balance, Decreased mobility, Decreased strength  Visit Diagnosis: No diagnosis found.     Problem List Patient Active Problem List   Diagnosis Date Noted  . Hip fracture ([place]) 08/24/2019  . PAF (paroxysmal atrial fibrillation) ([place]) 08/24/2019  . DNR (do not resuscitate) 08/24/2019  . ICD BV  09/15/2015:  St. Jude Medical [place] model CD3357-40Q (serial  Number [id number]) in situ 07/23/2018  . Encounter for management of biventricular implantable cardioverter-defibrillator (ICD) 07/23/2018  . Type 2 diabetes mellitus with hyperglycemia ([place]) 08/31/2016  . CKD (chronic kidney disease) stage 3, GFR 30-59 ml/min 08/14/2016  . Mixed dyslipidemia 08/14/2016  . Vitamin D insufficiency 08/14/2016  . Typical atrial flutter ([place]) 07/30/2016  . Atrial flutter ([place]) 07/30/2016  . Chronic systolic heart failure ([place]) 11/11/2015  . CHF (congestive heart failure) ([place]) 09/15/2015  . Chronic systolic congestive heart failure ([place])   . Dyspnea 08/20/2014  . Abnormal CT scan, chest 08/20/2014  . Essential (primary) hypertension 08/20/2014  . Abnormal computed tomography scan 08/20/2014  . Encounter to establish care 08/09/2014  . Type 2 diabetes mellitus without complication ([place]) 60/73/7106    Lavenia Atlas, SPTA 12/07/2019, 1:58 PM  [place]. [place], [place], [address] Phone: [phone]   Fax:  [phone]  Name: Janet Owen MRN: [id number] Date of Birth: [birth year]

## 2019-12-11 ENCOUNTER — Ambulatory Visit: Payer: Medicare Other | Admitting: Cardiology

## 2019-12-11 ENCOUNTER — Other Ambulatory Visit: Payer: Self-pay

## 2019-12-11 ENCOUNTER — Encounter: Payer: Medicare Other | Admitting: Physical Therapy

## 2019-12-11 ENCOUNTER — Encounter: Payer: Self-pay | Admitting: Cardiology

## 2019-12-11 VITALS — BP 149/79 | HR 64 | Resp 15 | Ht 67.0 in | Wt 180.4 lb

## 2019-12-11 DIAGNOSIS — I5022 Chronic systolic (congestive) heart failure: Secondary | ICD-10-CM

## 2019-12-11 DIAGNOSIS — I480 Paroxysmal atrial fibrillation: Secondary | ICD-10-CM

## 2019-12-11 DIAGNOSIS — I428 Other cardiomyopathies: Secondary | ICD-10-CM

## 2019-12-11 DIAGNOSIS — Z95810 Presence of automatic (implantable) cardiac defibrillator: Secondary | ICD-10-CM

## 2019-12-11 DIAGNOSIS — D696 Thrombocytopenia, unspecified: Secondary | ICD-10-CM

## 2019-12-11 LAB — CBC
Hematocrit: 46.3 % (ref 34.0–46.6)
Hemoglobin: 15.4 g/dL (ref 11.1–15.9)
MCH: 29.1 pg (ref 26.6–33.0)
MCHC: 33.3 g/dL (ref 31.5–35.7)
MCV: 87 fL (ref 79–97)
Platelets: 150 10*3/uL (ref 150–450)
RBC: 5.3 x10E6/uL — ABNORMAL HIGH (ref 3.77–5.28)
RDW: 13.1 % (ref 11.7–15.4)
WBC: 7.7 10*3/uL (ref 3.4–10.8)

## 2019-12-11 NOTE — Progress Notes (Signed)
Normal CBC including platelet count.  Will forward a copy to PCP.

## 2019-12-11 NOTE — Progress Notes (Signed)
Primary Physician/Referring:  Bartholome Bill, MD  Patient ID: Janet Owen, female    DOB: [birth year], 86 y.o.   MRN: [id number]  Chief Complaint  Patient presents with  . Follow-up    6 month  . Congestive Heart Failure  . Cardiomyopathy   HPI:    Janet Owen  is a 86 y.o. Caucasian female with nonischemic cardiomyopathy diagnosed in 50 in [place] with severe LV systolic dysfunction, EF 45%, paroxysmal atrial fibrillation, h/o atrial flutter s/p ablation on 07/31/2015, She has bi-V ICD placed on 11/09/2015, by Dr. Crissie Sickles.  Past medical history significant for hypertension, hyperlipidemia, type 2 diabetes mellitus and stage III chronic kidney disease.  This is her 6 month visit.  Her last admission to the hospital was on 08/24/2019 discharge 3 days later with right hip fracture and underwent right hip arthroplasty.  She had very transient atrial fibrillation.  Renal function remained stable.  She did develop mild thrombocytopenia and was recommended outpatient follow-up labs.  She is presently undergoing outpatient physical therapy.  For chronic back pain she is presently doing well and has not had any leg edema, no PND or orthopnea.  No palpitations or chest pain.  Past Medical History:  Diagnosis Date  . Atrial fibrillation ([place])   . DM (diabetes mellitus) ([place])   . Heart failure ([place])   . Hypertension    Past Surgical History:  Procedure Laterality Date  . A-FLUTTER ABLATION N/A 07/30/2016   Procedure: A-Flutter Ablation;  Surgeon: Evans Lance, MD;  Location: [facility];  Service: Cardiovascular;  Laterality: N/A;  . APPENDECTOMY    . BACK SURGERY    . EP IMPLANTABLE DEVICE N/A 11/11/2015   Procedure: BiVi Upgrade;  Surgeon: Evans Lance, MD;  Location: [facility];  Service: Cardiovascular;  Laterality: N/A;  . EP IMPLANTABLE DEVICE N/A 09/15/2015   Procedure: BiV ICD Insertion CRT-D;  Surgeon: Will Meredith Leeds, MD;  Location: [facility];  Service: Cardiovascular;  Laterality: N/A;  . PARTIAL HYSTERECTOMY    . REPLACEMENT TOTAL KNEE    . TOTAL HIP ARTHROPLASTY Right 08/25/2019   Procedure: TOTAL HIP ARTHROPLASTY ANTERIOR APPROACH;  Surgeon: Paralee Cancel, MD;  Location: [facility];  Service: Orthopedics;  Laterality: Right;  Marland Kitchen VESICOVAGINAL FISTULA CLOSURE W/ TAH     Family History  Problem Relation Age of Onset  . Asthma Mother   . Pancreatitis Mother   . Alcohol abuse Father     Social History   Tobacco Use  . Smoking status: Never Smoker  . Smokeless tobacco: Never Used  Substance Use Topics  . Alcohol use: No    Alcohol/week: 0.0 standard drinks   ROS  Review of Systems  Cardiovascular: Positive for dyspnea on exertion (stable) and leg swelling (occasional). Negative for chest pain.  Musculoskeletal: Positive for back pain and joint pain.  Gastrointestinal: Negative for melena.   Objective  Blood pressure (!) 149/79, pulse 64, resp. rate 15, height _0  (1.702 m), weight 180 lb 6.4 oz (81.8 kg), SpO2 99 %.  Vitals with BMI 12/11/2019 08/27/2019 08/27/2019  Height _1  - -  Weight 180 lbs 6 oz - -  BMI 03.88 - -  Systolic 828 003 491  Diastolic 79 76 78  Pulse 64 71 70     Physical Exam Cardiovascular:     Rate and Rhythm: Normal rate and regular rhythm.     Pulses: Intact distal pulses.  Heart sounds: Normal heart sounds. No murmur heard.  No gallop.      Comments: No leg edema, no JVD. Bilateral superficial varicose veins noted. Pulmonary:     Effort: Pulmonary effort is normal.     Breath sounds: Normal breath sounds.  Abdominal:     General: Bowel sounds are normal.     Palpations: Abdomen is soft.    Laboratory examination:   CMP Latest Ref Rng & Units 08/27/2019 08/26/2019 08/25/2019  Glucose 70 - 99 mg/dL 159(H) 215(H) 188(H)  BUN 8 - 23 mg/dL 43(H) 34(H) 32(H)  Creatinine 0.44 - 1.00 mg/dL 1.63(H) 1.56(H) 1.72(H)  Sodium 135 - 145 mmol/L 136 137 137  Potassium 3.5 -  5.1 mmol/L 4.5 5.1 4.1  Chloride 98 - 111 mmol/L 102 102 101  CO2 22 - 32 mmol/L _0 Calcium 8.9 - 10.3 mg/dL 9.5 9.5 9.7   CBC Latest Ref Rng & Units 08/27/2019 08/26/2019 08/25/2019  WBC 4.0 - 10.5 K/uL 8.6 8.0 10.6(H)  Hemoglobin 12.0 - 15.0 g/dL 12.0 13.0 14.7  Hematocrit 36 - 46 % 37.5 40.5 46.0  Platelets 150 - 400 K/uL 85(L) 101(L) 125(L)    HEMOGLOBIN A1C Lab Results  Component Value Date   HGBA1C 7.6 (H) 08/24/2019   MPG 171.42 08/24/2019   TSH Recent Labs    05/28/19 1141  TSH 3.340    Labs 10/23/2019:  A1c 7.4%.  Sodium 139, potassium 4.9, BUN 39, creatinine 1.70, EGFR 27 mL.  Total cholesterol 150, triglycerides 159, HDL 61, LDL 57.  External labs:   Labs 01/19/2019: A1c 7.6%.  Vitamin D 29.  Total cholesterol 148, TG 230, HDL 41, LDL 61   Sodium 131, potassium 4.4, BUN 35, S. Cr 1.55, EGFR 31 mL.  CMP otherwise normal.  Hb 15.2/HCT 45.7, platelets 142.  Cholesterol, total 148.000 M 11/18/2015 HDL 50.000 M 11/18/2015 LDL 78 Triglycerides 130.000 M 11/18/2015  A1C 6.800 % 11/18/2015 TSH 2.230 07/05/2017  Hemoglobin 11.000 G/ 07/05/2017 Platelets 209.000 X 07/05/2017  Creatinine, Serum 1.750 MG/ 07/05/2017 Potassium 5.400 07/23/2016 ALT (SGPT) 20.000 IU/ 07/05/2017  Medications and allergies   Allergies  Allergen Reactions  . Exenatide Nausea Only    Dizziness  . Raloxifene Other (See Comments)    hot flashes    Current Outpatient Medications on File Prior to Visit  Medication Sig Dispense Refill  . amiodarone (PACERONE) 200 MG tablet TAKE 1/2 TABLET BY MOUTH EVERY DAY (Patient taking differently: Take 100 mg by mouth daily. ) 90 tablet 3  . atorvastatin (LIPITOR) 20 MG tablet Take 20 mg by mouth daily.    . carvedilol (COREG) 12.5 MG tablet TAKE 1 TABLET BY MOUTH TWICE DAILY (Patient taking differently: Take 12.5 mg by mouth 2 (two) times daily with a meal. ) 180 tablet 3  . diphenhydramine-acetaminophen (TYLENOL PM) 25-500 MG TABS tablet Take  2 tablets by mouth at bedtime.    Marland Kitchen ezetimibe (ZETIA) 10 MG tablet Take one-half tablet (5 mg) by mouth daily    . furosemide (LASIX) 40 MG tablet TAKE 1 TABLET BY MOUTH DAILY (Patient taking differently: Take 40 mg by mouth daily. ) 90 tablet 3  . glucose blood (ONETOUCH VERIO) test strip USE TO TEST BLOOD SUGAR TWICE DAILY    . insulin glargine, 1 Unit Dial, (TOUJEO SOLOSTAR) 300 UNIT/ML Solostar Pen Inject 15 Units into the skin at bedtime. 1.5 pen 0  . Insulin Pen Needle (B-D UF III MINI PEN NEEDLES) 31G X 5 MM MISC  Use to inject Toujeo once daily.    Marland Kitchen JANUVIA 50 MG tablet Take 0.5 tablets (25 mg total) by mouth daily. (Patient taking differently: Take 50 mg by mouth daily. ) 15 tablet 0  . nateglinide (STARLIX) 120 MG tablet Take 120 mg by mouth 3 (three) times daily before meals. For diabetes     . Vitamin D, Cholecalciferol, 25 MCG (1000 UT) TABS Take 1,000 Units by mouth daily.     No current facility-administered medications on file prior to visit.    Radiology:   No results found.  Cardiac Studies:   Coronary Angiogram  [02/04/2014]:  Left and right heart catheterization 02/04/2014: Right atrial pressure 10 mmHg. Right ventricular pressure 32/2 with RVEDP of 15 mmHg. PA pressure 29/15 with mean of 22 mmHg. Pulmonary capillary wedge pressure mean of 6 mmHg. PA saturation 64% and aortic saturation 92%. Fick cardiac output of 5.6 L and index 2.9 L/m. LVEF 55%.  Normal coronary arteries.  Echocardiogram  08/08/2017: Left ventricle cavity is normal in size. Moderate concentric hypertrophy of the left ventricle. Abnormal septal wall motion due to left bundle branch block. Doppler evidence of grade II (pseudonormal) diastolic dysfunction, elevated LAP. Calculated EF 43%. Left atrial cavity is severely dilated. Moderate tricuspid regurgitation. Estimated pulmonary artery systolic pressure 33 mmHg. Compared to previous echocardiogram on 06/08/2015, LVEF and mitral regurgitation  improved. Otherwise no significant change.  Device clinic CRTD 09/15/2015:  [place]    Scheduled  In office ICD 02/25/19  Single (S)/Dual (D)/BV (M) BV Presenting AVBV 75/min Pacer dependant: Not. Underlying [place] @ 28 with 1st degree AVB.  AP 90%, VP NA%. BP 100.% AMS Episodes 0.  HVR 0.  Longevity 3.6 Years/Voltage. Charge time 9.4 Sec. Lead measurements: Stable Histogram: Low (L)/normal (N)/high (H)  Flat. Patient activity Good. Thoracic impedance: Trending up but no trigger for CHF  Observations: normal ICD function.  Changes: Increased slope from 11 to 12 for rate response.   Scheduled Remote ICD check 11/26/2019:  1 AHR episodes, false mode switch. No VT/VF episode. Corvue impedance monitoring does not suggest recent fluid accumulation. Battery longevity is 2.7 years. RA pacing is 93.0 %, RV pacing is >99.0 %, and LV pacing is >99.0 %.  EKG:   EKG 12/11/2019: Normal sinus rhythm at rate of 70 bpm, biventricular pacemaker detected.  No further analysis.  No significant change from  EKG 05/28/2019   Assessment     ICD-10-CM   1. Chronic systolic congestive heart failure (HCC)  I50.22 EKG 12-Lead    PCV ECHOCARDIOGRAM COMPLETE  2. NICM (nonischemic cardiomyopathy) ([place])  I42.8   3. ICD BV  09/15/2015:  St. Jude Medical [place] model [phone] (serial  Number [id number]) in situ  Z95.810   4. Paroxysmal atrial fibrillation ([place]). CHA2DS2-VASc Score is 6.  Yearly risk of stroke: 9.8% (A, F, HTN, DM, CHF).  I48.0 PCV ECHOCARDIOGRAM COMPLETE  5. Thrombocytopenia (HCC)  D69.6 CBC    No orders of the defined types were placed in this encounter.   Medications Discontinued During This Encounter  Medication Reason  . ferrous sulfate 325 (65 FE) MG tablet Completed Course  . HYDROcodone-acetaminophen (NORCO) 5-325 MG tablet No longer needed (for PRN medications)  . methocarbamol (ROBAXIN) 500 MG tablet No longer needed (for PRN medications)  . polyethylene  glycol (MIRALAX / GLYCOLAX) 17 g packet No longer needed (for PRN medications)    Recommendations:   YANNIS GUMBS  is a 86 y.o. Caucasian  female with nonischemic cardiomyopathy diagnosed in 81 in [place] with severe LV systolic dysfunction, EF 84%, paroxysmal atrial fibrillation, h/o atrial flutter s/p ablation on 07/31/2015, She has bi-V ICD placed on 11/09/2015, by Dr. Crissie Sickles.  Past medical history significant for hypertension, hyperlipidemia, type 2 diabetes mellitus and stage III chronic kidney disease.   Her last admission to the hospital was on 08/24/2019 discharge 3 days later with right hip fracture and underwent right hip arthroplasty.  She had very transient atrial fibrillation but review of hospital records did not reveal AF. She has had AT brief on ICD monitoring and hence does not need anticoagulation. She did develop mild thrombocytopenia and was recommended outpatient follow-up labs, I will check CBC today.  She is presently undergoing outpatient physical therapy.  She has been off of anticoagulation with Eliquis over the past year or so, as she has not had any A. fib, will continue observation for now but will have a very low threshold to start oral anticoagulation.  Her cardioembolic risk is fairly high.  Also in view of thrombocytopenia she is not on anticoagulation as well.  I will repeat an echocardiogram to reevaluate her LV systolic function and left atrial size.  She is presently doing well without clinical evidence of congestive heart failure.  Dyspnea has remained stable, no PND, trace leg edema and she does have varicose veins.  I reviewed her lipids, recently her diabetes has been uncontrolled, previously triglycerides are very well controlled.  I did not make any changes to her medications.  I will see her back in 6 months for follow-up.  She will continue more monitoring of her ICD.  This was a 40-minute office visit encounter with review of external records,  external labs, coordination of care.   Adrian Prows, MD, [facility] 12/11/2019, 10:29 AM Office: [phone] Pager: [phone]

## 2019-12-14 ENCOUNTER — Ambulatory Visit: Payer: Medicare Other | Attending: Orthopedic Surgery | Admitting: Physical Therapy

## 2019-12-14 ENCOUNTER — Other Ambulatory Visit: Payer: Self-pay

## 2019-12-14 ENCOUNTER — Encounter: Payer: Self-pay | Admitting: Physical Therapy

## 2019-12-14 DIAGNOSIS — R2681 Unsteadiness on feet: Secondary | ICD-10-CM | POA: Diagnosis not present

## 2019-12-14 DIAGNOSIS — R262 Difficulty in walking, not elsewhere classified: Secondary | ICD-10-CM | POA: Diagnosis present

## 2019-12-14 DIAGNOSIS — R2689 Other abnormalities of gait and mobility: Secondary | ICD-10-CM | POA: Diagnosis present

## 2019-12-14 NOTE — Therapy (Signed)
[address] [place], [place], [address] Phone: [phone]   Fax:  [phone] Progress Note Reporting Period 10/21/19 to 12/14/19 for the first 10 visits  See note below for Objective Data and Assessment of Progress/Goals.      Physical Therapy Treatment  Patient Details  Name: Janet Owen MRN: [id number] Date of Birth: October 21, 1933 Referring Provider (PT): Cathe Mons Date: 12/14/2019   PT End of Session - 12/14/19 1008    Visit Number 10    Date for PT Re-Evaluation 12/22/19    PT Start Time 0928    PT Stop Time 1010    PT Time Calculation (min) 42 min    Activity Tolerance Patient tolerated treatment well    Behavior During Therapy [facility] for tasks assessed/performed           Past Medical History:  Diagnosis Date  . Atrial fibrillation ([place])   . DM (diabetes mellitus) ([place])   . Heart failure ([place])   . Hypertension     Past Surgical History:  Procedure Laterality Date  . A-FLUTTER ABLATION N/A 07/30/2016   Procedure: A-Flutter Ablation;  Surgeon: Evans Lance, MD;  Location: [facility];  Service: Cardiovascular;  Laterality: N/A;  . APPENDECTOMY    . BACK SURGERY    . EP IMPLANTABLE DEVICE N/A 11/11/2015   Procedure: BiVi Upgrade;  Surgeon: Evans Lance, MD;  Location: [facility];  Service: Cardiovascular;  Laterality: N/A;  . EP IMPLANTABLE DEVICE N/A 09/15/2015   Procedure: BiV ICD Insertion CRT-D;  Surgeon: Will Meredith Leeds, MD;  Location: [facility];  Service: Cardiovascular;  Laterality: N/A;  . PARTIAL HYSTERECTOMY    . REPLACEMENT TOTAL KNEE    . TOTAL HIP ARTHROPLASTY Right 08/25/2019   Procedure: TOTAL HIP ARTHROPLASTY ANTERIOR APPROACH;  Surgeon: Paralee Cancel, MD;  [facility];  Service: Orthopedics;  Laterality: Right;  Marland Kitchen VESICOVAGINAL FISTULA CLOSURE W/ TAH      There were no vitals filed for this visit.   Subjective Assessment - 12/14/19  0929    Subjective Patient reports that if she gets a handicapped space she does not have to negotiate curbs, no falls    Currently in Pain? No/denies                             [facility] Adult PT Treatment/Exercise - 12/14/19 0001      Ambulation/Gait   Gait Comments stairs with hand rails step over step, outside walking up and down curbs, with about 5-10 second hesitation on the first one and then really had very little hesitation      High Level Balance   High Level Balance Comments obstacle course, working on stepping up and on small steps and dynamic surface, then stepping over and around obstacles, CGA trying to get to supervision only, walking ball toss, minitramp march and small bounces, 2" toe touches      Knee/Hip Exercises: Aerobic   Nustep level 5 x 6 mins                    PT Short Term Goals - 10/27/19 1100      PT SHORT TERM GOAL #1   Title independent with initial HEP    Status Achieved             PT Long Term Goals - 12/14/19 1010  PT LONG TERM GOAL #1   Title TUG with SPC 25 seconds    Status Achieved      PT LONG TERM GOAL #2   Title increase Berg to 42/56    Status Partially Met      PT LONG TERM GOAL #3   Title independent with advanced HEP for strength and balance    Status Partially Met                 Plan - 12/14/19 1009    Clinical Impression Statement Patient still really hesitates and struggles with stepping up and or over objects.  Once she gets the rhythm and feels better she does better, today was her best that I have seen her hesitating on the curb outside for about 5-10 se3conds on the first one and then the next curbs without much hesitation    PT Next Visit Plan go over her HEP for balance, next visit could be her last if she feels good    Consulted and Agree with Plan of Care Patient           Patient will benefit from skilled therapeutic intervention in order to improve the following  deficits and impairments:  Abnormal gait, Decreased range of motion, Difficulty walking, Cardiopulmonary status limiting activity, Decreased activity tolerance, Decreased balance, Decreased mobility, Decreased strength  Visit Diagnosis: Unsteady gait when walking  Other abnormalities of gait and mobility  Difficulty in walking, not elsewhere classified     Problem List Patient Active Problem List   Diagnosis Date Noted  . Hip fracture ([place]) 08/24/2019  . PAF (paroxysmal atrial fibrillation) ([place]) 08/24/2019  . DNR (do not resuscitate) 08/24/2019  . ICD BV  09/15/2015:  St. Jude Medical [place] model [id number] (serial  Number [id number]) in situ 07/23/2018  . Encounter for management of biventricular implantable cardioverter-defibrillator (ICD) 07/23/2018  . Type 2 diabetes mellitus with hyperglycemia ([place]) 08/31/2016  . CKD (chronic kidney disease) stage 3, GFR 30-59 ml/min 08/14/2016  . Mixed dyslipidemia 08/14/2016  . Vitamin D insufficiency 08/14/2016  . Typical atrial flutter ([place]) 07/30/2016  . Atrial flutter ([place]) 07/30/2016  . Chronic systolic heart failure ([place]) 11/11/2015  . CHF (congestive heart failure) ([place]) 09/15/2015  . Chronic systolic congestive heart failure ([place])   . Dyspnea 08/20/2014  . Abnormal CT scan, chest 08/20/2014  . Essential (primary) hypertension 08/20/2014  . Abnormal computed tomography scan 08/20/2014  . Encounter to establish care 08/09/2014  . Type 2 diabetes mellitus without complication ([place]) 40/98/1191    Sumner Boast., PT 12/14/2019, 10:11 AM  [address] [place], [place], [address] Phone: [phone]   Fax:  [phone]  Name: MERIS REEDE MRN: [id number] Date of Birth: March 09, 1933

## 2019-12-18 ENCOUNTER — Ambulatory Visit: Payer: Medicare Other | Admitting: Physical Therapy

## 2019-12-21 ENCOUNTER — Ambulatory Visit: Payer: Medicare Other | Admitting: Cardiology

## 2019-12-24 ENCOUNTER — Encounter: Payer: Self-pay | Admitting: Physical Therapy

## 2019-12-24 ENCOUNTER — Ambulatory Visit: Payer: Medicare Other | Admitting: Physical Therapy

## 2019-12-24 ENCOUNTER — Other Ambulatory Visit: Payer: Self-pay

## 2019-12-24 DIAGNOSIS — R2681 Unsteadiness on feet: Secondary | ICD-10-CM

## 2019-12-24 DIAGNOSIS — R262 Difficulty in walking, not elsewhere classified: Secondary | ICD-10-CM

## 2019-12-24 DIAGNOSIS — R2689 Other abnormalities of gait and mobility: Secondary | ICD-10-CM

## 2019-12-24 NOTE — Therapy (Signed)
[place]. [place], [place], [address] Phone: [phone]   Fax:  [phone]  Physical Therapy Treatment  Patient Details  Name: Janet Owen MRN: [id number] Date of Birth: [birth year] Referring Provider (PT): Cathe Mons Date: 12/24/2019   PT End of Session - 12/24/19 1048    Visit Number 11    Date for PT Re-Evaluation 01/23/20    PT Start Time 1009    PT Stop Time 1052    PT Time Calculation (min) 43 min    Activity Tolerance Patient tolerated treatment well    Behavior During Therapy [facility] for tasks assessed/performed           Past Medical History:  Diagnosis Date  . Atrial fibrillation ([place])   . DM (diabetes mellitus) ([place])   . Heart failure ([place])   . Hypertension     Past Surgical History:  Procedure Laterality Date  . A-FLUTTER ABLATION N/A 07/30/2016   Procedure: A-Flutter Ablation;  Surgeon: Evans Lance, MD;  Location: [place] [facility];  Service: Cardiovascular;  Laterality: N/A;  . APPENDECTOMY    . BACK SURGERY    . EP IMPLANTABLE DEVICE N/A 11/11/2015   Procedure: BiVi Upgrade;  Surgeon: Evans Lance, MD;  Location: [place] [facility];  Service: Cardiovascular;  Laterality: N/A;  . EP IMPLANTABLE DEVICE N/A 09/15/2015   Procedure: BiV ICD Insertion CRT-D;  Surgeon: Will Meredith Leeds, MD;  Location: [place] [facility];  Service: Cardiovascular;  Laterality: N/A;  . PARTIAL HYSTERECTOMY    . REPLACEMENT TOTAL KNEE    . TOTAL HIP ARTHROPLASTY Right 08/25/2019   Procedure: TOTAL HIP ARTHROPLASTY ANTERIOR APPROACH;  Surgeon: Paralee Cancel, MD;  [facility];  Service: Orthopedics;  Laterality: Right;  Marland Kitchen VESICOVAGINAL FISTULA CLOSURE W/ TAH      There were no vitals filed for this visit.   Subjective Assessment - 12/24/19 1014    Subjective Patient reports that she is feeling better about her walking in the real world, reports still issues with turning, "feel off  balance" with turning    Currently in Pain? No/denies              [facility] PT Assessment - 12/24/19 0001      Assessment   Medical Diagnosis s/p right anterior THA    Referring Provider (PT) Lamona Curl Balance Test   Sit to Stand Able to stand without using hands and stabilize independently    Standing Unsupported Able to stand safely 2 minutes    Sitting with Back Unsupported but Feet Supported on Floor or Stool Able to sit safely and securely 2 minutes    Stand to Sit Sits safely with minimal use of hands    Transfers Able to transfer safely, minor use of hands    Standing Unsupported with Eyes Closed Able to stand 10 seconds with supervision    Standing Unsupported with Feet Together Able to place feet together independently and stand for 1 minute with supervision    From Standing, Reach Forward with Outstretched Arm Can reach forward >12 cm safely (5")    From Standing Position, Pick up Object from Floor Able to pick up shoe, needs supervision    From Standing Position, Turn to Look Behind Over each Shoulder Looks behind one side only/other side shows less weight shift    Turn 360 Degrees Able to turn 360 degrees safely one side only  in 4 seconds or less    Standing Unsupported, Alternately Place Feet on Step/Stool Able to complete 4 steps without aid or supervision    Standing Unsupported, One Foot in [place] to take small step independently and hold 30 seconds    Standing on One Leg Tries to lift leg/unable to hold 3 seconds but remains standing independently    Total Score 43      Timed Up and Go Test   Normal TUG (seconds) 14                         [facility] Adult PT Treatment/Exercise - 12/24/19 0001      Ambulation/Gait   Gait Comments gait outside negottiating curbs up and down and then walking in the grass with close SBA, occasional CGA      High Level Balance   High Level Balance Comments light handhold change of directions, fwd, bkwd, side  stepping, obstacle course that included around objects , step overs and direction changes      Knee/Hip Exercises: Aerobic   Nustep level 5 x 6 mins      Knee/Hip Exercises: Machines for Strengthening   Cybex Knee Extension 10# 2x10    Cybex Knee Flexion #25 2x10                     PT Short Term Goals - 10/27/19 1100      PT SHORT TERM GOAL #1   Title independent with initial HEP    Status Achieved             PT Long Term Goals - 12/24/19 1051      PT LONG TERM GOAL #1   Title TUG with SPC 25 seconds    Status Achieved      PT LONG TERM GOAL #2   Title increase Berg to 42/56    Status Partially Met      PT LONG TERM GOAL #3   Title independent with advanced HEP for strength and balance    Status Partially Met      PT LONG TERM GOAL #4   Title walk 250 feet with a SPC    Status Achieved                 Plan - 12/24/19 1048    Clinical Impression Statement Patient has made great progress over the past month, she is now walking without device, can negotiate curbs with minimal to no hesitancy.  She does report near loss of balance at home with turning.  We worked on this today and stepping over objects remains difficult as does the dynamic surface walking, she does get short of breath with activity, she is still requiring SBA/CGA with the outside walking    PT Next Visit Plan Patient has issue with turns and dynamic surface walking, will look to advance this to not needing any supervision and her feeling more confident in the next period    Consulted and Agree with Plan of Care Patient           Patient will benefit from skilled therapeutic intervention in order to improve the following deficits and impairments:  Abnormal gait, Decreased range of motion, Difficulty walking, Cardiopulmonary status limiting activity, Decreased activity tolerance, Decreased balance, Decreased mobility, Decreased strength  Visit Diagnosis: Unsteady gait when walking -  Plan: PT plan of care cert/re-cert  Other abnormalities of gait and mobility - Plan: PT  plan of care cert/re-cert  Difficulty in walking, not elsewhere classified - Plan: PT plan of care cert/re-cert     Problem List Patient Active Problem List   Diagnosis Date Noted  . Hip fracture ([place]) 08/24/2019  . PAF (paroxysmal atrial fibrillation) ([place]) 08/24/2019  . DNR (do not resuscitate) 08/24/2019  . ICD BV  09/15/2015:  [employer] [place] model CD3357-40Q (serial  Number [id number]) in situ 07/23/2018  . Encounter for management of biventricular implantable cardioverter-defibrillator (ICD) 07/23/2018  . Type 2 diabetes mellitus with hyperglycemia ([place]) 08/31/2016  . CKD (chronic kidney disease) stage 3, GFR 30-59 ml/min 08/14/2016  . Mixed dyslipidemia 08/14/2016  . Vitamin D insufficiency 08/14/2016  . Typical atrial flutter ([place]) 07/30/2016  . Atrial flutter ([place]) 07/30/2016  . Chronic systolic heart failure ([place]) 11/11/2015  . CHF (congestive heart failure) ([place]) 09/15/2015  . Chronic systolic congestive heart failure ([place])   . Dyspnea 08/20/2014  . Abnormal CT scan, chest 08/20/2014  . Essential (primary) hypertension 08/20/2014  . Abnormal computed tomography scan 08/20/2014  . Encounter to establish care 08/09/2014  . Type 2 diabetes mellitus without complication ([place]) 98/26/4158    Sumner Boast., PT 12/24/2019, 10:54 AM  [place]. [place], [place], [address] Phone: [phone]   Fax:  [phone]  Name: KATESSA ATTRIDGE MRN: [id number] Date of Birth: [birth year]

## 2019-12-30 ENCOUNTER — Encounter (HOSPITAL_COMMUNITY): Payer: Self-pay

## 2019-12-30 ENCOUNTER — Emergency Department (HOSPITAL_COMMUNITY): Payer: Medicare Other

## 2019-12-30 ENCOUNTER — Other Ambulatory Visit: Payer: Self-pay

## 2019-12-30 ENCOUNTER — Observation Stay (HOSPITAL_COMMUNITY): Payer: Medicare Other

## 2019-12-30 ENCOUNTER — Observation Stay (HOSPITAL_COMMUNITY)
Admission: EM | Admit: 2019-12-30 | Discharge: 2019-12-31 | Disposition: A | Discharge status: Home / Self Care | Payer: Medicare Other | Attending: Internal Medicine | Admitting: Internal Medicine

## 2019-12-30 DIAGNOSIS — N390 Urinary tract infection, site not specified: Principal | ICD-10-CM | POA: Diagnosis present

## 2019-12-30 DIAGNOSIS — Z20822 Contact with and (suspected) exposure to covid-19: Secondary | ICD-10-CM | POA: Diagnosis not present

## 2019-12-30 DIAGNOSIS — E119 Type 2 diabetes mellitus without complications: Secondary | ICD-10-CM | POA: Diagnosis not present

## 2019-12-30 DIAGNOSIS — I5022 Chronic systolic (congestive) heart failure: Secondary | ICD-10-CM | POA: Insufficient documentation

## 2019-12-30 DIAGNOSIS — I4891 Unspecified atrial fibrillation: Secondary | ICD-10-CM | POA: Insufficient documentation

## 2019-12-30 DIAGNOSIS — A419 Sepsis, unspecified organism: Secondary | ICD-10-CM | POA: Insufficient documentation

## 2019-12-30 DIAGNOSIS — Z96651 Presence of right artificial knee joint: Secondary | ICD-10-CM | POA: Insufficient documentation

## 2019-12-30 DIAGNOSIS — N183 Chronic kidney disease, stage 3 unspecified: Secondary | ICD-10-CM | POA: Diagnosis not present

## 2019-12-30 DIAGNOSIS — Z794 Long term (current) use of insulin: Secondary | ICD-10-CM | POA: Insufficient documentation

## 2019-12-30 DIAGNOSIS — Z79899 Other long term (current) drug therapy: Secondary | ICD-10-CM | POA: Diagnosis not present

## 2019-12-30 DIAGNOSIS — R6883 Chills (without fever): Secondary | ICD-10-CM | POA: Diagnosis present

## 2019-12-30 DIAGNOSIS — I130 Hypertensive heart and chronic kidney disease with heart failure and stage 1 through stage 4 chronic kidney disease, or unspecified chronic kidney disease: Secondary | ICD-10-CM | POA: Insufficient documentation

## 2019-12-30 DIAGNOSIS — Z96641 Presence of right artificial hip joint: Secondary | ICD-10-CM | POA: Diagnosis not present

## 2019-12-30 LAB — TROPONIN I (HIGH SENSITIVITY)
Troponin I (High Sensitivity): 14 ng/L (ref <18)
Troponin I (High Sensitivity): 14 ng/L (ref <18)

## 2019-12-30 LAB — CBC WITH DIFFERENTIAL/PLATELET
Abs Immature Granulocytes: 0.05 10*3/uL (ref 0.00–0.07)
Basophils Absolute: 0 10*3/uL (ref 0.0–0.1)
Basophils Relative: 0 %
Eosinophils Absolute: 0 10*3/uL (ref 0.0–0.5)
Eosinophils Relative: 0 %
HCT: 46.1 % — ABNORMAL HIGH (ref 36.0–46.0)
Hemoglobin: 14.9 g/dL (ref 12.0–15.0)
Immature Granulocytes: 1 %
Lymphocytes Relative: 6 %
Lymphs Abs: 0.5 10*3/uL — ABNORMAL LOW (ref 0.7–4.0)
MCH: 29.1 pg (ref 26.0–34.0)
MCHC: 32.3 g/dL (ref 30.0–36.0)
MCV: 90 fL (ref 80.0–100.0)
Monocytes Absolute: 0.2 10*3/uL (ref 0.1–1.0)
Monocytes Relative: 3 %
Neutro Abs: 6.9 10*3/uL (ref 1.7–7.7)
Neutrophils Relative %: 90 %
Platelets: 120 10*3/uL — ABNORMAL LOW (ref 150–400)
RBC: 5.12 MIL/uL — ABNORMAL HIGH (ref 3.87–5.11)
RDW: 13.6 % (ref 11.5–15.5)
WBC: 7.7 10*3/uL (ref 4.0–10.5)
nRBC: 0 % (ref 0.0–0.2)

## 2019-12-30 LAB — CBC
HCT: 44.5 % (ref 36.0–46.0)
Hemoglobin: 14.2 g/dL (ref 12.0–15.0)
MCH: 28.7 pg (ref 26.0–34.0)
MCHC: 31.9 g/dL (ref 30.0–36.0)
MCV: 89.9 fL (ref 80.0–100.0)
Platelets: 122 10*3/uL — ABNORMAL LOW (ref 150–400)
RBC: 4.95 MIL/uL (ref 3.87–5.11)
RDW: 13.8 % (ref 11.5–15.5)
WBC: 9.2 10*3/uL (ref 4.0–10.5)
nRBC: 0 % (ref 0.0–0.2)

## 2019-12-30 LAB — GLUCOSE, CAPILLARY
Glucose-Capillary: 143 mg/dL — ABNORMAL HIGH (ref 70–99)
Glucose-Capillary: 212 mg/dL — ABNORMAL HIGH (ref 70–99)

## 2019-12-30 LAB — COMPREHENSIVE METABOLIC PANEL
ALT: 28 U/L (ref 0–44)
AST: 36 U/L (ref 15–41)
Albumin: 3.9 g/dL (ref 3.5–5.0)
Alkaline Phosphatase: 77 U/L (ref 38–126)
Anion gap: 13 (ref 5–15)
BUN: 34 mg/dL — ABNORMAL HIGH (ref 8–23)
CO2: 27 mmol/L (ref 22–32)
Calcium: 9.4 mg/dL (ref 8.9–10.3)
Chloride: 98 mmol/L (ref 98–111)
Creatinine, Ser: 1.72 mg/dL — ABNORMAL HIGH (ref 0.44–1.00)
GFR, Estimated: 29 mL/min — ABNORMAL LOW (ref >60)
Glucose, Bld: 181 mg/dL — ABNORMAL HIGH (ref 70–99)
Potassium: 4.3 mmol/L (ref 3.5–5.1)
Sodium: 138 mmol/L (ref 135–145)
Total Bilirubin: 1 mg/dL (ref 0.3–1.2)
Total Protein: 6.9 g/dL (ref 6.5–8.1)

## 2019-12-30 LAB — URINALYSIS, ROUTINE W REFLEX MICROSCOPIC
Bilirubin Urine: NEGATIVE
Glucose, UA: NEGATIVE mg/dL
Ketones, ur: NEGATIVE mg/dL
Nitrite: POSITIVE — AB
Protein, ur: NEGATIVE mg/dL
Specific Gravity, Urine: 1.006 (ref 1.005–1.030)
WBC, UA: >50 WBC/hpf — ABNORMAL HIGH (ref 0–5)
pH: 5 (ref 5.0–8.0)

## 2019-12-30 LAB — RESPIRATORY PANEL BY RT PCR (FLU A&B, COVID)
Influenza A by PCR: NEGATIVE
Influenza B by PCR: NEGATIVE
SARS Coronavirus 2 by RT PCR: NEGATIVE

## 2019-12-30 LAB — LACTIC ACID, PLASMA
Lactic Acid, Venous: 2.2 mmol/L (ref 0.5–1.9)
Lactic Acid, Venous: 1.7 mmol/L (ref 0.5–1.9)

## 2019-12-30 LAB — CREATININE, SERUM
Creatinine, Ser: 1.72 mg/dL — ABNORMAL HIGH (ref 0.44–1.00)
GFR, Estimated: 29 mL/min — ABNORMAL LOW (ref >60)

## 2019-12-30 LAB — BRAIN NATRIURETIC PEPTIDE: B Natriuretic Peptide: 289 pg/mL — ABNORMAL HIGH (ref 0.0–100.0)

## 2019-12-30 LAB — MAGNESIUM: Magnesium: 1.9 mg/dL (ref 1.7–2.4)

## 2019-12-30 MED ORDER — INSULIN GLARGINE 100 UNIT/ML ~~LOC~~ SOLN
20.0000 [IU] | Freq: Every day | SUBCUTANEOUS | Status: DC
  Administered 2019-12-30: 20 [IU] via SUBCUTANEOUS
  Filled 2019-12-30: qty 0.2

## 2019-12-30 MED ORDER — SODIUM CHLORIDE 0.9 % IV BOLUS (SEPSIS)
500.0000 mL | Freq: Once | INTRAVENOUS | Status: AC
  Administered 2019-12-30: 500 mL via INTRAVENOUS

## 2019-12-30 MED ORDER — SODIUM CHLORIDE 0.9 % IV BOLUS
500.0000 mL | Freq: Once | INTRAVENOUS | Status: AC
  Administered 2019-12-30: 500 mL via INTRAVENOUS

## 2019-12-30 MED ORDER — FUROSEMIDE 10 MG/ML IJ SOLN
40.0000 mg | Freq: Once | INTRAMUSCULAR | Status: AC
  Administered 2019-12-30: 40 mg via INTRAVENOUS
  Filled 2019-12-30: qty 4

## 2019-12-30 MED ORDER — CARVEDILOL 12.5 MG PO TABS
12.5000 mg | ORAL_TABLET | Freq: Two times a day (BID) | ORAL | Status: DC
  Administered 2019-12-30 – 2019-12-31 (×2): 12.5 mg via ORAL
  Filled 2019-12-30 (×2): qty 1

## 2019-12-30 MED ORDER — ONDANSETRON HCL 4 MG PO TABS: 4.0000 mg | ORAL_TABLET | Freq: Four times a day (QID) | ORAL | Status: DC | PRN

## 2019-12-30 MED ORDER — SODIUM CHLORIDE 0.9 % IV SOLN
1.0000 g | INTRAVENOUS | Status: DC
  Administered 2019-12-30: 1 g via INTRAVENOUS
  Filled 2019-12-30: qty 10

## 2019-12-30 MED ORDER — ACETAMINOPHEN 325 MG PO TABS: 650.0000 mg | ORAL_TABLET | Freq: Four times a day (QID) | ORAL | Status: DC | PRN

## 2019-12-30 MED ORDER — INSULIN ASPART 100 UNIT/ML ~~LOC~~ SOLN
0.0000 [IU] | Freq: Three times a day (TID) | SUBCUTANEOUS | Status: DC
  Administered 2019-12-31 (×2): 3 [IU] via SUBCUTANEOUS

## 2019-12-30 MED ORDER — ATORVASTATIN CALCIUM 20 MG PO TABS
20.0000 mg | ORAL_TABLET | Freq: Every day | ORAL | Status: DC
  Administered 2019-12-31: 11:00:00 20 mg via ORAL
  Filled 2019-12-30: qty 1

## 2019-12-30 MED ORDER — IOHEXOL 9 MG/ML PO SOLN
ORAL | Status: AC
  Filled 2019-12-30: qty 1000

## 2019-12-30 MED ORDER — ONDANSETRON HCL 4 MG/2ML IJ SOLN: 4.0000 mg | Freq: Four times a day (QID) | INTRAMUSCULAR | Status: DC | PRN

## 2019-12-30 MED ORDER — ACETAMINOPHEN 650 MG RE SUPP: 650.0000 mg | Freq: Four times a day (QID) | RECTAL | Status: DC | PRN

## 2019-12-30 MED ORDER — AMIODARONE HCL 100 MG PO TABS
100.0000 mg | ORAL_TABLET | Freq: Every day | ORAL | Status: DC
  Administered 2019-12-31: 11:00:00 100 mg via ORAL
  Filled 2019-12-30: qty 1

## 2019-12-30 MED ORDER — SODIUM CHLORIDE 0.9 % IV SOLN
1.0000 g | INTRAVENOUS | Status: DC
  Filled 2019-12-30: qty 10

## 2019-12-30 MED ORDER — LACTATED RINGERS IV SOLN: INTRAVENOUS | Status: DC

## 2019-12-30 MED ORDER — HEPARIN SODIUM (PORCINE) 5000 UNIT/ML IJ SOLN
5000.0000 [IU] | Freq: Three times a day (TID) | INTRAMUSCULAR | Status: DC
  Administered 2019-12-30 – 2019-12-31 (×2): 5000 [IU] via SUBCUTANEOUS
  Filled 2019-12-30 (×2): qty 1

## 2019-12-30 NOTE — ED Triage Notes (Signed)
Pt BIBA from home-  Per EMS- Pt c/o weakness, chills starting today; substernal chest pressure- denies chest pain, denies ShOB x2-3 days. Unsteady on feet, unable to ambulate without assistance.   Ems reports tachypnea, exertional dyspnea. AOx4, RA

## 2019-12-30 NOTE — ED Notes (Signed)
Pt placed on purewick at 60 mmHg. 

## 2019-12-30 NOTE — ED Provider Notes (Signed)
[facility] DEPT Provider Note   CSN: [id number] Arrival date & time: 12/30/19  1120     History Chief Complaint  Patient presents with  . Weakness  . Chills    Janet Owen is a 86 y.o. female.  She is here with complaint of some pressure in her chest that is been going on for a few days.  She says it is indigestion.  She also had some burning with urination so drank a bunch of fluids and still has a little bit of low abdominal discomfort.  Today she felt generally weak and had some shaking in her arms.  No headache no cough no diarrhea or constipation.  No hematuria. The history is provided by the patient.  Chest Pain Pain location:  Substernal area Pain quality: pressure   Pain radiates to:  Does not radiate Pain severity:  Mild Onset quality:  Gradual Timing:  Intermittent Progression:  Unchanged Relieved by:  Nothing Exacerbated by: eating. Associated symptoms: abdominal pain, fatigue, nausea, shortness of breath, vomiting and weakness   Associated symptoms: no cough and no fever   Abdominal pain:    Location:  Suprapubic   Quality: dull     Severity:  Mild   Onset quality:  Gradual   Timing:  Intermittent   Progression:  Unchanged   Chronicity:  New Risk factors: diabetes mellitus and high cholesterol        Past Medical History:  Diagnosis Date  . Atrial fibrillation ([place])   . DM (diabetes mellitus) ([place])   . Heart failure ([place])   . Hypertension     Patient Active Problem List   Diagnosis Date Noted  . Hip fracture ([place]) 08/24/2019  . PAF (paroxysmal atrial fibrillation) ([place]) 08/24/2019  . DNR (do not resuscitate) 08/24/2019  . ICD BV  09/15/2015:  [employer]. Jude Medical [place] model CD3357-40Q (serial  Number [id number]) in situ 07/23/2018  . Encounter for management of biventricular implantable cardioverter-defibrillator (ICD) 07/23/2018  . Type 2 diabetes mellitus with hyperglycemia ([place]) 08/31/2016  . CKD (chronic  kidney disease) stage 3, GFR 30-59 ml/min 08/14/2016  . Mixed dyslipidemia 08/14/2016  . Vitamin D insufficiency 08/14/2016  . Typical atrial flutter ([place]) 07/30/2016  . Atrial flutter ([place]) 07/30/2016  . Chronic systolic heart failure ([place]) 11/11/2015  . CHF (congestive heart failure) ([place]) 09/15/2015  . Chronic systolic congestive heart failure ([place])   . Dyspnea 08/20/2014  . Abnormal CT scan, chest 08/20/2014  . Essential (primary) hypertension 08/20/2014  . Abnormal computed tomography scan 08/20/2014  . Encounter to establish care 08/09/2014  . Type 2 diabetes mellitus without complication ([place]) 16/02/930    Past Surgical History:  Procedure Laterality Date  . A-FLUTTER ABLATION N/A 07/30/2016   Procedure: A-Flutter Ablation;  Surgeon: Evans Lance, MD;  Location: [place] [facility];  Service: Cardiovascular;  Laterality: N/A;  . APPENDECTOMY    . BACK SURGERY    . EP IMPLANTABLE DEVICE N/A 11/11/2015   Procedure: BiVi Upgrade;  Surgeon: Evans Lance, MD;  Location: [place] [facility];  Service: Cardiovascular;  Laterality: N/A;  . EP IMPLANTABLE DEVICE N/A 09/15/2015   Procedure: BiV ICD Insertion CRT-D;  Surgeon: Will Meredith Leeds, MD;  Location: [place] [facility];  Service: Cardiovascular;  Laterality: N/A;  . PARTIAL HYSTERECTOMY    . REPLACEMENT TOTAL KNEE    . TOTAL HIP ARTHROPLASTY Right 08/25/2019   Procedure: TOTAL HIP ARTHROPLASTY ANTERIOR APPROACH;  Surgeon: Paralee Cancel, MD;  [facility];  Service: Orthopedics;  Laterality: Right;  Marland Kitchen VESICOVAGINAL FISTULA CLOSURE W/ TAH       OB History   No obstetric history on file.     Family History  Problem Relation Age of Onset  . Asthma Mother   . Pancreatitis Mother   . Alcohol abuse Father     Social History   Tobacco Use  . Smoking status: Never Smoker  . Smokeless tobacco: Never Used  Vaping Use  . Vaping Use: Never used  Substance Use Topics  . Alcohol use: No    Alcohol/week: 0.0 standard  drinks  . Drug use: No    Home Medications Prior to Admission medications   Medication Sig Start Date End Date Taking? Authorizing Provider  amiodarone (PACERONE) 200 MG tablet TAKE 1/2 TABLET BY MOUTH EVERY DAY Patient taking differently: Take 100 mg by mouth daily.  03/02/19   Adrian Prows, MD  atorvastatin (LIPITOR) 20 MG tablet Take 20 mg by mouth daily.    [provider]  carvedilol (COREG) 12.5 MG tablet TAKE 1 TABLET BY MOUTH TWICE DAILY Patient taking differently: Take 12.5 mg by mouth 2 (two) times daily with a meal.  07/06/19   Adrian Prows, MD  diphenhydramine-acetaminophen (TYLENOL PM) 25-500 MG TABS tablet Take 2 tablets by mouth at bedtime.    [provider]  ezetimibe (ZETIA) 10 MG tablet Take one-half tablet (5 mg) by mouth daily    [provider]  furosemide (LASIX) 40 MG tablet TAKE 1 TABLET BY MOUTH DAILY Patient taking differently: Take 40 mg by mouth daily.  05/20/19   Adrian Prows, MD  glucose blood (ONETOUCH VERIO) test strip USE TO TEST BLOOD SUGAR TWICE DAILY 03/23/19   [provider]  insulin glargine, 1 Unit Dial, (TOUJEO SOLOSTAR) 300 UNIT/ML Solostar Pen Inject 15 Units into the skin at bedtime. 08/27/19 12/11/19  Elodia Florence., MD  Insulin Pen Needle (B-D UF III MINI PEN NEEDLES) 31G X 5 MM MISC Use to inject Toujeo once daily. 01/30/19   [provider]  JANUVIA 50 MG tablet Take 0.5 tablets (25 mg total) by mouth daily. Patient taking differently: Take 50 mg by mouth daily.  08/27/19 12/11/19  Elodia Florence., MD  nateglinide (STARLIX) 120 MG tablet Take 120 mg by mouth 3 (three) times daily before meals. For diabetes  09/28/16   [provider]  Vitamin D, Cholecalciferol, 25 MCG (1000 UT) TABS Take 1,000 Units by mouth daily.    [provider]    Allergies    Exenatide and Raloxifene  Review of Systems   Review of Systems  Constitutional: Positive for chills and fatigue. Negative for  fever.  HENT: Negative for sore throat.   Eyes: Negative for visual disturbance.  Respiratory: Positive for shortness of breath. Negative for cough.   Cardiovascular: Positive for chest pain.  Gastrointestinal: Positive for abdominal pain, nausea and vomiting.  Genitourinary: Positive for dysuria.  Musculoskeletal: Negative for joint swelling.  Skin: Negative for rash.  Neurological: Positive for weakness.    Physical Exam Updated Vital Signs BP 134/62 (BP Location: Right Arm)   Pulse 73   Temp 99 F (37.2 C) (Oral)   Resp (!) 24   SpO2 98%   Physical Exam Vitals and nursing note reviewed.  Constitutional:      General: She is not in acute distress.    Appearance: Normal appearance. She is well-developed.  HENT:     Head:  Normocephalic and atraumatic.  Eyes:     Conjunctiva/sclera: Conjunctivae normal.  Cardiovascular:     Rate and Rhythm: Normal rate and regular rhythm.     Heart sounds: No murmur heard.   Pulmonary:     Effort: Pulmonary effort is normal. No respiratory distress.     Breath sounds: Normal breath sounds.  Abdominal:     Palpations: Abdomen is soft.     Tenderness: There is no abdominal tenderness.  Musculoskeletal:        General: No tenderness. Normal range of motion.     Cervical back: Neck supple.     Right lower leg: No edema.     Left lower leg: No edema.  Skin:    General: Skin is warm and dry.  Neurological:     General: No focal deficit present.     Mental Status: She is alert.     ED Results / Procedures / Treatments   Labs (all labs ordered are listed, but only abnormal results are displayed) Labs Reviewed  COMPREHENSIVE METABOLIC PANEL - Abnormal; Notable for the following components:      Result Value   Glucose, Bld 181 (*)    BUN 34 (*)    Creatinine, Ser 1.72 (*)    GFR, Estimated 29 (*)    All other components within normal limits  CBC WITH DIFFERENTIAL/PLATELET - Abnormal; Notable for the following components:   RBC  5.12 (*)    HCT 46.1 (*)    Platelets 120 (*)    Lymphs Abs 0.5 (*)    All other components within normal limits  URINALYSIS, ROUTINE W REFLEX MICROSCOPIC - Abnormal; Notable for the following components:   Color, Urine STRAW (*)    APPearance HAZY (*)    Hgb urine dipstick MODERATE (*)    Nitrite POSITIVE (*)    Leukocytes,Ua LARGE (*)    WBC, UA >50 (*)    Bacteria, UA FEW (*)    All other components within normal limits  BRAIN NATRIURETIC PEPTIDE - Abnormal; Notable for the following components:   B Natriuretic Peptide 289.0 (*)    All other components within normal limits  LACTIC ACID, PLASMA - Abnormal; Notable for the following components:   Lactic Acid, Venous 2.2 (*)    All other components within normal limits  RESPIRATORY PANEL BY RT PCR (FLU A&B, COVID)  URINE CULTURE  CULTURE, BLOOD (ROUTINE X 2)  CULTURE, BLOOD (ROUTINE X 2)  LACTIC ACID, PLASMA  CBC  CREATININE, SERUM  MAGNESIUM  COMPREHENSIVE METABOLIC PANEL  CBC  HEMOGLOBIN A1C  TROPONIN I (HIGH SENSITIVITY)  TROPONIN I (HIGH SENSITIVITY)    EKG EKG Interpretation  Date/Time:  Wednesday December 30 2019 11:58:02 EST Ventricular Rate:  74 PR Interval:    QRS Duration: 156 QT Interval:  431 QTC Calculation: 479 R Axis:   -71 Text Interpretation: AV dual-paced rhythm Confirmed by Aletta Edouard [phone]) on 12/30/2019 12:00:58 PM Also confirmed by Aletta Edouard [phone]), editor Hattie Perch ([id number])  on 12/30/2019 2:47:13 PM   Radiology CT Abdomen Pelvis Wo Contrast  Result Date: 12/30/2019 CLINICAL DATA:  Abdominal pain, fever EXAM: CT ABDOMEN AND PELVIS WITHOUT CONTRAST TECHNIQUE: Multidetector CT imaging of the abdomen and pelvis was performed following the standard protocol without IV contrast. COMPARISON:  None. FINDINGS: Lower chest: Cardiomegaly. Small hiatal hernia. Pacer wires noted in the heart. No acute abnormality. Hepatobiliary: No focal hepatic abnormality. Gallbladder unremarkable.  Pancreas: No focal abnormality or ductal dilatation. Spleen: Scattered calcifications.  Normal size. Adrenals/Urinary Tract: Areas of cortical thinning and scarring. No hydronephrosis. No renal or adrenal mass. Urinary bladder unremarkable. Stomach/Bowel: Extensive left colonic diverticulosis, most pronounced in the sigmoid colon. No active diverticulitis. Moderate stool burden throughout the colon. Stomach and small bowel decompressed, unremarkable. Vascular/Lymphatic: Aortic atherosclerosis. No evidence of aneurysm or adenopathy. Reproductive: Prior hysterectomy.  No adnexal masses. Other: No free fluid or free air. Bilateral inguinal hernias containing fat. Musculoskeletal: No acute bony abnormality. Prior right hip replacement. Leftward scoliosis and degenerative changes in the lumbar spine. IMPRESSION: Extensive left colonic diverticulosis.  No active diverticulitis. Cardiomegaly, aortic atherosclerosis. Small hiatal hernia. Bilateral inguinal hernias containing fat. No acute findings. Electronically Signed   By: Rolm Baptise M.D.   On: 12/30/2019 18:47   DG Chest Port 1 View  Result Date: 12/30/2019 CLINICAL DATA:  Chest pain. EXAM: PORTABLE CHEST 1 VIEW COMPARISON:  August 24, 2019. FINDINGS: Cardiac silhouette is upper limits of normal in size, similar to prior. Similar positioning of a left complaining of approach cardiac rhythm maintenance device. Aortic atherosclerosis. Dense right lower lung nodule, compatible with a granuloma. Calcified right hilar lymph node, similar to prior. No consolidation. No visible pleural effusions or pneumothorax. The visualized skeletal structures are unremarkable. IMPRESSION: No acute cardiopulmonary disease. Electronically Signed   By: Margaretha Sheffield MD   On: 12/30/2019 12:23    Procedures .Critical Care Performed by: Hayden Rasmussen, MD Authorized by: Hayden Rasmussen, MD   Critical care provider statement:    Critical care time (minutes):  45   Critical  care time was exclusive of:  Separately billable procedures and treating other patients   Critical care was necessary to treat or prevent imminent or life-threatening deterioration of the following conditions:  Sepsis   Critical care was time spent personally by me on the following activities:  Discussions with consultants, evaluation of patient's response to treatment, examination of patient, ordering and performing treatments and interventions, ordering and review of laboratory studies, ordering and review of radiographic studies, pulse oximetry, re-evaluation of patient's condition, obtaining history from patient or surrogate, review of old charts and development of treatment plan with patient or surrogate   (including critical care time)  Medications Ordered in ED Medications  iohexol (OMNIPAQUE) 9 MG/ML oral solution (has no administration in time range)  cefTRIAXone (ROCEPHIN) 1 g in sodium chloride 0.9 % 100 mL IVPB (has no administration in time range)  amiodarone (PACERONE) tablet 100 mg (has no administration in time range)  atorvastatin (LIPITOR) tablet 20 mg (has no administration in time range)  carvedilol (COREG) tablet 12.5 mg (has no administration in time range)  heparin injection 5,000 Units (has no administration in time range)  acetaminophen (TYLENOL) tablet 650 mg (has no administration in time range)    Or  acetaminophen (TYLENOL) suppository 650 mg (has no administration in time range)  ondansetron (ZOFRAN) tablet 4 mg (has no administration in time range)    Or  ondansetron (ZOFRAN) injection 4 mg (has no administration in time range)  insulin glargine (LANTUS) injection 20 Units (has no administration in time range)  insulin aspart (novoLOG) injection 0-15 Units (has no administration in time range)  sodium chloride 0.9 % bolus 500 mL (0 mLs Intravenous Stopped 12/30/19 1345)  sodium chloride 0.9 % bolus 500 mL (0 mLs Intravenous Stopped 12/30/19 1345)  furosemide  (LASIX) injection 40 mg (40 mg Intravenous Given 12/30/19 1544)    ED Course  I have reviewed the triage vital signs and  the nursing notes.  Pertinent labs & imaging results that were available during my care of the patient were reviewed by me and considered in my medical decision making (see chart for details).  Clinical Course as of Dec 29 1913  Wed Dec 30, 2019  1204 Received a call of the patient's lactate is elevated at 2.2.  Have added on some more fluids and antibiotics.  Still awaiting source although likely urine.   [MB]  1829 Chest x-ray interpreted by me as pacer AICD, no gross infiltrates.   [MB]  9371 Reassessed patient, she is having some rigors now.  She also sounds a little wheezy not sure if she is fluid overloaded.  We will diurese her and get a CT abdomen and pelvis.   [MB]    Clinical Course User Index [MB] Hayden Rasmussen, MD   MDM Rules/Calculators/A&P                         This patient complains of low abdominal pain, shaking chills, malaise; this involves an extensive number of treatment Options and is a complaint that carries with it a high risk of complications and Morbidity. The differential includes sepsis, Sirs, pyelonephritis, UTI, diverticulitis, colitis  I ordered, reviewed and interpreted labs, which included CBC with normal white count, stable hemoglobin, chemistries fairly normal other than elevated glucose elevated BUN and creatinine not far off baseline, normal LFTs, BNP elevated, urinalysis with signs of infection, lactic acid elevated and cleared on repeat I ordered medication IV fluids, IV antibiotics, IV Lasix I ordered imaging studies which included chest x-ray and CT abdomen and pelvis and I independently    visualized and interpreted imaging which showed cardiomegaly, no gross infiltrates, diverticulosis Previous records obtained and reviewed in epic, admitted in July for hip fracture I consulted [facility] hospitalist Dr. Marylyn Ishihara and discussed  lab and imaging findings  Critical Interventions: Treatment of sepsis with aggressive IV fluids and antibiotics.  Unfortunately patient became more short of breath need to be diuresed.  She will need to be admitted to the hospital for her UTI/Pilo.  After the interventions stated above, I reevaluated the patient and found patient to be symptomatically improved.  She is agreeable to plan for admission for further work-up of her symptoms.   Final Clinical Impression(s) / ED Diagnoses Final diagnoses:  Sepsis, due to unspecified organism, unspecified whether acute organ dysfunction present [facility])  Lower urinary tract infectious disease    Rx / DC Orders ED Discharge Orders    None       Hayden Rasmussen, MD 12/30/19 1919

## 2019-12-30 NOTE — H&P (Signed)
History and Physical    Janet Owen QQI:[id number] DOB: December 26, 1933 DOA: 12/30/2019  PCP: Bartholome Bill, MD  Patient coming from: Home  Chief Complaint: Chills and suprapubic pain.   HPI: Janet Owen is a 86 y.o. female with medical history significant for a fib, DM2. Presenting with chills and suprapubic pain. She reports that she woke with sudden onset chills this morning. She couldn't get warm and couldn't figure out why. When asked about any other symptoms, she states that she's had some urinary burning over the last couple of days that seems to have progressed to suprapubic pain. She denies any change in urine consistency or frequency. She became concerned this morning and decided to call 911. She denies any alleviating or aggravating factors. She denies any specific treatments other than to drink a lot of water.   ED Course: She was found to have an elevated lactic acid and AKI. Her UA was dirty. She was given fluids and started on rocephin. [facility] was called for admission.   Review of Systems:  She denies CP, N/V/D, fevers, palpitations. She reports indigestion. Review of systems is otherwise negative for all not mentioned in HPI.   PMHx Past Medical History:  Diagnosis Date  . Atrial fibrillation ([place])   . DM (diabetes mellitus) ([place])   . Heart failure ([place])   . Hypertension     PSHx Past Surgical History:  Procedure Laterality Date  . A-FLUTTER ABLATION N/A 07/30/2016   Procedure: A-Flutter Ablation;  Surgeon: Evans Lance, MD;  Location: [facility];  Service: Cardiovascular;  Laterality: N/A;  . APPENDECTOMY    . BACK SURGERY    . EP IMPLANTABLE DEVICE N/A 11/11/2015   Procedure: BiVi Upgrade;  Surgeon: Evans Lance, MD;  Location: [facility];  Service: Cardiovascular;  Laterality: N/A;  . EP IMPLANTABLE DEVICE N/A 09/15/2015   Procedure: BiV ICD Insertion CRT-D;  Surgeon: Will Meredith Leeds, MD;  Location: [facility];   Service: Cardiovascular;  Laterality: N/A;  . PARTIAL HYSTERECTOMY    . REPLACEMENT TOTAL KNEE    . TOTAL HIP ARTHROPLASTY Right 08/25/2019   Procedure: TOTAL HIP ARTHROPLASTY ANTERIOR APPROACH;  Surgeon: Paralee Cancel, MD;  Location: [facility];  Service: Orthopedics;  Laterality: Right;  Marland Kitchen VESICOVAGINAL FISTULA CLOSURE W/ TAH      SocHx  reports that she has never smoked. She has never used smokeless tobacco. She reports that she does not drink alcohol and does not use drugs.  Allergies  Allergen Reactions  . Exenatide Nausea Only    Dizziness  . Raloxifene Other (See Comments)    hot flashes    FamHx Family History  Problem Relation Age of Onset  . Asthma Mother   . Pancreatitis Mother   . Alcohol abuse Father     Prior to Admission medications   Medication Sig Start Date End Date Taking? Authorizing Provider  amiodarone (PACERONE) 200 MG tablet TAKE 1/2 TABLET BY MOUTH EVERY DAY Patient taking differently: Take 100 mg by mouth daily.  03/02/19  Yes Adrian Prows, MD  atorvastatin (LIPITOR) 20 MG tablet Take 20 mg by mouth daily.   Yes [provider]  bismuth subsalicylate (PEPTO BISMOL) 262 MG/15ML suspension Take 30 mLs by mouth daily as needed for indigestion or diarrhea or loose stools.   Yes [provider]  carvedilol (COREG) 12.5 MG tablet TAKE 1 TABLET BY MOUTH TWICE DAILY Patient taking differently: Take 12.5 mg by mouth 2 (  two) times daily with a meal.  07/06/19  Yes Adrian Prows, MD  diphenhydramine-acetaminophen (TYLENOL PM) 25-500 MG TABS tablet Take 2 tablets by mouth at bedtime.   Yes [provider]  ezetimibe (ZETIA) 10 MG tablet Take one-half tablet (5 mg) by mouth daily   Yes [provider]  furosemide (LASIX) 40 MG tablet TAKE 1 TABLET BY MOUTH DAILY Patient taking differently: Take 40 mg by mouth daily.  05/20/19  Yes Adrian Prows, MD  insulin glargine, 1 Unit Dial, (TOUJEO SOLOSTAR) 300 UNIT/ML Solostar Pen Inject 15 Units into the  skin at bedtime. Patient taking differently: Inject 30 Units into the skin at bedtime.  08/27/19 12/30/19 Yes Elodia Florence., MD  JANUVIA 50 MG tablet Take 0.5 tablets (25 mg total) by mouth daily. Patient taking differently: Take 50 mg by mouth daily.  08/27/19 12/30/19 Yes Elodia Florence., MD  nateglinide (STARLIX) 120 MG tablet Take 120 mg by mouth 3 (three) times daily before meals. For diabetes  09/28/16  Yes [provider]  Vitamin D, Cholecalciferol, 25 MCG (1000 UT) TABS Take 1,000 Units by mouth daily.   Yes [provider]  glucose blood (ONETOUCH VERIO) test strip USE TO TEST BLOOD SUGAR TWICE DAILY 03/23/19   [provider]  Insulin Pen Needle (B-D UF III MINI PEN NEEDLES) 31G X 5 MM MISC Use to inject [place] once daily. 01/30/19   [provider]    Physical Exam: Vitals:   12/30/19 1151 12/30/19 1245 12/30/19 1415 12/30/19 1543  BP:  115/64 136/72 (!) 171/84  Pulse: 74 67 64 70  Resp: (!) 21 14 (!) 21 18  Temp:      TempSrc:      SpO2: 95% 96% 99% 97%  Weight: 81 kg     Height: 5\' 7"  (1.702 m)       General: 86 y.o. female resting in bed in NAD Eyes: PERRL, normal sclera ENMT: Nares patent w/o discharge, orophaynx clear, dentition normal, ears w/o discharge/lesions/ulcers Neck: Supple, trachea midline Cardiovascular: RRR, +S1, S2, no m/g/r, equal pulses throughout Respiratory: CTABL, no w/r/r, normal WOB GI: BS+, NDNT, no masses noted, no organomegaly noted MSK: No e/c/c Skin: No rashes, bruises, ulcerations noted Neuro: A&O x 3, no focal deficits Psyc: Appropriate interaction and affect, calm/cooperative  Labs on Admission: I have personally reviewed following labs and imaging studies  CBC: Recent Labs  Lab 12/30/19 1149  WBC 7.7  NEUTROABS 6.9  HGB 14.9  HCT 46.1*  MCV 90.0  PLT 500*   Basic Metabolic Panel: Recent Labs  Lab 12/30/19 1149  NA 138  K 4.3  CL 98  CO2 27  GLUCOSE 181*  BUN 34*    CREATININE 1.72*  CALCIUM 9.4   GFR: Estimated Creatinine Clearance: 26.7 mL/min (A) (by C-G formula based on SCr of 1.72 mg/dL (H)). Liver Function Tests: Recent Labs  Lab 12/30/19 1149  AST 36  ALT 28  ALKPHOS 77  BILITOT 1.0  PROT 6.9  ALBUMIN 3.9   No results for input(s): LIPASE, AMYLASE in the last 168 hours. No results for input(s): AMMONIA in the last 168 hours. Coagulation Profile: No results for input(s): INR, PROTIME in the last 168 hours. Cardiac Enzymes: No results for input(s): CKTOTAL, CKMB, CKMBINDEX, TROPONINI in the last 168 hours. BNP (last 3 results) No results for input(s): PROBNP in the last 8760 hours. HbA1C: No results for input(s): HGBA1C in the last 72 hours. CBG: No results for  input(s): GLUCAP in the last 168 hours. Lipid Profile: No results for input(s): CHOL, HDL, LDLCALC, TRIG, CHOLHDL, LDLDIRECT in the last 72 hours. Thyroid Function Tests: No results for input(s): TSH, T4TOTAL, FREET4, T3FREE, THYROIDAB in the last 72 hours. Anemia Panel: No results for input(s): VITAMINB12, FOLATE, FERRITIN, TIBC, IRON, RETICCTPCT in the last 72 hours. Urine analysis:    Component Value Date/Time   COLORURINE STRAW (A) 12/30/2019 1436   APPEARANCEUR HAZY (A) 12/30/2019 1436   LABSPEC 1.006 12/30/2019 1436   PHURINE 5.0 12/30/2019 1436   GLUCOSEU NEGATIVE 12/30/2019 1436   HGBUR MODERATE (A) 12/30/2019 1436   BILIRUBINUR NEGATIVE 12/30/2019 1436   KETONESUR NEGATIVE 12/30/2019 1436   PROTEINUR NEGATIVE 12/30/2019 1436   NITRITE POSITIVE (A) 12/30/2019 1436   LEUKOCYTESUR LARGE (A) 12/30/2019 1436    Radiological Exams on Admission: DG Chest Port 1 View  Result Date: 12/30/2019 CLINICAL DATA:  Chest pain. EXAM: PORTABLE CHEST 1 VIEW COMPARISON:  August 24, 2019. FINDINGS: Cardiac silhouette is upper limits of normal in size, similar to prior. Similar positioning of a left complaining of approach cardiac rhythm maintenance device. Aortic  atherosclerosis. Dense right lower lung nodule, compatible with a granuloma. Calcified right hilar lymph node, similar to prior. No consolidation. No visible pleural effusions or pneumothorax. The visualized skeletal structures are unremarkable. IMPRESSION: No acute cardiopulmonary disease. Electronically Signed   By: Margaretha Sheffield MD   On: 12/30/2019 12:23    Assessment/Plan UTI     - admit to obs, med-surg     - continue rocephin, fluids (gently given her sHF)     - awaiting UCx  CKD4     - baseline Scr is 1.5 - 1.7; she is at her baseline     - CT w/o hydronephrosis     - watch nephrotoxins  A fib Chronic systolic HF w/ ICD     - continue home amiodarone, coreg; lasix in the AM     - eliquis has been held for some time now; follows with Dr. Einar Gip  DM2     - lantus, SSI, A1c, follow glucose  HLD     - continue lipitor  DVT prophylaxis: heparin  Code Status: FULL  Family Communication: None at bedside  Consults called: None  Status is: Observation  The patient remains OBS appropriate and will d/c before 2 midnights.  Dispo: The patient is from: Home              Anticipated d/c is to: Home              Anticipated d/c date is: 1 day              Patient currently is not medically stable to d/c.  Jonnie Finner DO [facility]  If 7PM-7AM, please contact night-coverage [email or web address]  12/30/2019, 4:29 PM

## 2019-12-31 ENCOUNTER — Ambulatory Visit: Payer: Medicare Other | Admitting: Physical Therapy

## 2019-12-31 DIAGNOSIS — N390 Urinary tract infection, site not specified: Secondary | ICD-10-CM | POA: Diagnosis not present

## 2019-12-31 LAB — GLUCOSE, CAPILLARY
Glucose-Capillary: 157 mg/dL — ABNORMAL HIGH (ref 70–99)
Glucose-Capillary: 156 mg/dL — ABNORMAL HIGH (ref 70–99)

## 2019-12-31 LAB — COMPREHENSIVE METABOLIC PANEL
ALT: 32 U/L (ref 0–44)
AST: 32 U/L (ref 15–41)
Albumin: 3.4 g/dL — ABNORMAL LOW (ref 3.5–5.0)
Alkaline Phosphatase: 67 U/L (ref 38–126)
Anion gap: 11 (ref 5–15)
BUN: 32 mg/dL — ABNORMAL HIGH (ref 8–23)
CO2: 25 mmol/L (ref 22–32)
Calcium: 9.1 mg/dL (ref 8.9–10.3)
Chloride: 101 mmol/L (ref 98–111)
Creatinine, Ser: 1.62 mg/dL — ABNORMAL HIGH (ref 0.44–1.00)
GFR, Estimated: 31 mL/min — ABNORMAL LOW (ref >60)
Glucose, Bld: 129 mg/dL — ABNORMAL HIGH (ref 70–99)
Potassium: 3.7 mmol/L (ref 3.5–5.1)
Sodium: 137 mmol/L (ref 135–145)
Total Bilirubin: 0.6 mg/dL (ref 0.3–1.2)
Total Protein: 6.2 g/dL — ABNORMAL LOW (ref 6.5–8.1)

## 2019-12-31 LAB — HEMOGLOBIN A1C
Hgb A1c MFr Bld: 8 % — ABNORMAL HIGH (ref 4.8–5.6)
Mean Plasma Glucose: 182.9 mg/dL

## 2019-12-31 LAB — CBC
HCT: 41.5 % (ref 36.0–46.0)
Hemoglobin: 13.2 g/dL (ref 12.0–15.0)
MCH: 28.6 pg (ref 26.0–34.0)
MCHC: 31.8 g/dL (ref 30.0–36.0)
MCV: 90 fL (ref 80.0–100.0)
Platelets: 121 10*3/uL — ABNORMAL LOW (ref 150–400)
RBC: 4.61 MIL/uL (ref 3.87–5.11)
RDW: 14 % (ref 11.5–15.5)
WBC: 7.6 10*3/uL (ref 4.0–10.5)
nRBC: 0 % (ref 0.0–0.2)

## 2019-12-31 MED ORDER — FAMOTIDINE 20 MG PO TABS: 20.0000 mg | ORAL_TABLET | Freq: Two times a day (BID) | ORAL | 1 refills | Status: DC

## 2019-12-31 MED ORDER — SODIUM CHLORIDE 0.9 % IV SOLN
1.0000 g | Freq: Once | INTRAVENOUS | Status: AC
  Administered 2019-12-31: 1 g via INTRAVENOUS
  Filled 2019-12-31: qty 1

## 2019-12-31 MED ORDER — CEPHALEXIN 500 MG PO CAPS: 500.0000 mg | ORAL_CAPSULE | Freq: Three times a day (TID) | ORAL | 0 refills | Status: AC

## 2019-12-31 NOTE — Discharge Summary (Signed)
Physician Discharge Summary  Janet Owen ZOX:[id number] DOB: 1933/06/01 DOA: 12/30/2019  PCP: Bartholome Bill, MD  Admit date: 12/30/2019 Discharge date: 12/31/2019  Admitted From: Home Disposition: Home  Recommendations for Outpatient Follow-up:  1. Follow up with PCP in 1-2 weeks 2. We will call with any different urine culture.  Home Health: Not applicable Equipment/Devices: Not applicable  Discharge Condition: Stable CODE STATUS: Full code Diet recommendation: Low-salt, low-carb diet  Discharge summary:  86 year old female with history of chronic A. fib status post pacemaker, type 2 diabetes on insulin presented to the ER with 2 days of chills and suprapubic pain.  In the emergency room her lactic acid was 2.2, she had developed some AKI with history of underlying stage IIIa CKD.  UA was consistent with infection.  She was given IV fluids and treated with Rocephin.  Was admitted to observation due to significant symptoms.  With fluid resuscitation and antibiotic all symptoms has improved now.  Her blood cultures and urine cultures are pending.  Since patient is asymptomatic today and her renal functions are improved back to her baseline, will give second dose of IV Rocephin and discharged on oral antibiotics with Keflex for 5 more days. If urine culture grows any resistant organism, will call patient. No change in her longstanding medications were done.  Patient complains of symptoms of dyspepsia including early satiety.  Will prescribe Pepcid 20 mg twice a day as a trial.   Discharge Diagnoses:  Active Problems:   UTI (urinary tract infection)    Discharge Instructions  Discharge Instructions    Call MD for:  persistant nausea and vomiting   Complete by: As directed    Diet - low sodium heart healthy   Complete by: As directed    Diet Carb Modified   Complete by: As directed    Increase activity slowly   Complete by: As directed      Allergies as of  12/31/2019      Reactions   Exenatide Nausea Only   Dizziness   Raloxifene Other (See Comments)   hot flashes      Medication List    TAKE these medications   amiodarone 200 MG tablet Commonly known as: PACERONE TAKE 1/2 TABLET BY MOUTH EVERY DAY   atorvastatin 20 MG tablet Commonly known as: LIPITOR Take 20 mg by mouth daily.   B-D UF III MINI PEN NEEDLES 31G X 5 MM Misc Generic drug: Insulin Pen Needle Use to inject Toujeo once daily.   bismuth subsalicylate 811 BJ/47WG suspension Commonly known as: PEPTO BISMOL Take 30 mLs by mouth daily as needed for indigestion or diarrhea or loose stools.   carvedilol 12.5 MG tablet Commonly known as: COREG TAKE 1 TABLET BY MOUTH TWICE DAILY What changed: when to take this   cephALEXin 500 MG capsule Commonly known as: KEFLEX Take 1 capsule (500 mg total) by mouth 3 (three) times daily for 5 days.   diphenhydramine-acetaminophen 25-500 MG Tabs tablet Commonly known as: TYLENOL PM Take 2 tablets by mouth at bedtime.   ezetimibe 10 MG tablet Commonly known as: ZETIA Take one-half tablet (5 mg) by mouth daily   famotidine 20 MG tablet Commonly known as: PEPCID Take 1 tablet (20 mg total) by mouth 2 (two) times daily.   furosemide 40 MG tablet Commonly known as: LASIX TAKE 1 TABLET BY MOUTH DAILY   Januvia 50 MG tablet Generic drug: sitaGLIPtin Take 0.5 tablets (25 mg total) by mouth daily. What changed: how  much to take   nateglinide 120 MG tablet Commonly known as: STARLIX Take 120 mg by mouth 3 (three) times daily before meals. For diabetes   OneTouch Verio test strip Generic drug: glucose blood USE TO TEST BLOOD SUGAR TWICE DAILY   Toujeo SoloStar 300 UNIT/ML Solostar Pen Generic drug: insulin glargine (1 Unit Dial) Inject 15 Units into the skin at bedtime. What changed: how much to take   Vitamin D (Cholecalciferol) 25 MCG (1000 UT) Tabs Take 1,000 Units by mouth daily.       Follow-up Information     Bartholome Bill, MD Follow up in 2 week(s).   Specialty: Family Medicine Contact information: [place] [address] [phone]              Allergies  Allergen Reactions  . Exenatide Nausea Only    Dizziness  . Raloxifene Other (See Comments)    hot flashes    Consultations:  None   Procedures/Studies: CT Abdomen Pelvis Wo Contrast  Result Date: 12/30/2019 CLINICAL DATA:  Abdominal pain, fever EXAM: CT ABDOMEN AND PELVIS WITHOUT CONTRAST TECHNIQUE: Multidetector CT imaging of the abdomen and pelvis was performed following the standard protocol without IV contrast. COMPARISON:  None. FINDINGS: Lower chest: Cardiomegaly. Small hiatal hernia. Pacer wires noted in the heart. No acute abnormality. Hepatobiliary: No focal hepatic abnormality. Gallbladder unremarkable. Pancreas: No focal abnormality or ductal dilatation. Spleen: Scattered calcifications.  Normal size. Adrenals/Urinary Tract: Areas of cortical thinning and scarring. No hydronephrosis. No renal or adrenal mass. Urinary bladder unremarkable. Stomach/Bowel: Extensive left colonic diverticulosis, most pronounced in the sigmoid colon. No active diverticulitis. Moderate stool burden throughout the colon. Stomach and small bowel decompressed, unremarkable. Vascular/Lymphatic: Aortic atherosclerosis. No evidence of aneurysm or adenopathy. Reproductive: Prior hysterectomy.  No adnexal masses. Other: No free fluid or free air. Bilateral inguinal hernias containing fat. Musculoskeletal: No acute bony abnormality. Prior right hip replacement. Leftward scoliosis and degenerative changes in the lumbar spine. IMPRESSION: Extensive left colonic diverticulosis.  No active diverticulitis. Cardiomegaly, aortic atherosclerosis. Small hiatal hernia. Bilateral inguinal hernias containing fat. No acute findings. Electronically Signed   By: Rolm Baptise M.D.   On: 12/30/2019 18:47   DG Chest  Port 1 View  Result Date: 12/30/2019 CLINICAL DATA:  Chest pain. EXAM: PORTABLE CHEST 1 VIEW COMPARISON:  August 24, 2019. FINDINGS: Cardiac silhouette is upper limits of normal in size, similar to prior. Similar positioning of a left complaining of approach cardiac rhythm maintenance device. Aortic atherosclerosis. Dense right lower lung nodule, compatible with a granuloma. Calcified right hilar lymph node, similar to prior. No consolidation. No visible pleural effusions or pneumothorax. The visualized skeletal structures are unremarkable. IMPRESSION: No acute cardiopulmonary disease. Electronically Signed   By: Margaretha Sheffield MD   On: 12/30/2019 12:23   (Echo, Carotid, EGD, Colonoscopy, ERCP)    Subjective: Patient seen and examined.  No overnight events.  Today denies any complaints.  No problem urinating.  She does have some epigastric discomfort ongoing for long time and thinks she might have some acid buildup. Remains afebrile.  Able to eat and drink without nausea or vomiting. Eager to go home.   Discharge Exam: Vitals:   12/31/19 0110 12/31/19 0507  BP: 127/61 121/79  Pulse: 60 67  Resp: 18 18  Temp: 98.3 F (36.8 C) 98.4 F (36.9 C)  SpO2: 94% 95%   Vitals:   12/30/19 1900 12/30/19 2243 12/31/19 0110 12/31/19 0507  BP: Marland Kitchen)  159/83 (!) 127/58 127/61 121/79  Pulse: 64 63 60 67  Resp: 16 18 18 18   Temp: 98 F (36.7 C) 98.3 F (36.8 C) 98.3 F (36.8 C) 98.4 F (36.9 C)  TempSrc: Oral Oral Oral Oral  SpO2: 99% 97% 94% 95%  Weight:      Height:        General: Pt is alert, awake, not in acute distress Cardiovascular: RRR, S1/S2 +, no rubs, no gallops Pacemaker present left precordial region. Respiratory: CTA bilaterally, no wheezing, no rhonchi Abdominal: Soft, NT, ND, bowel sounds + Extremities: no edema, no cyanosis    The results of significant diagnostics from this hospitalization (including imaging, microbiology, ancillary and laboratory) are listed below for  reference.     Microbiology: Recent Results (from the past 240 hour(s))  Culture, blood (routine x 2)     Status: None (Preliminary result)   Collection Time: 12/30/19 12:50 PM   Specimen: BLOOD  Result Value Ref Range Status   Specimen Description   Final    BLOOD LEFT ANTECUBITAL Performed at [facility] [address] [place], [place] [address]    Special Requests   Final    BOTTLES DRAWN AEROBIC AND ANAEROBIC Blood Culture adequate volume Performed at [facility] [address], [place], [place] [address]    Culture   Final    NO GROWTH < 24 HOURS Performed at [facility] [address] [place], [place] [address]    Report Status PENDING  Incomplete  Culture, blood (routine x 2)     Status: None (Preliminary result)   Collection Time: 12/30/19  1:42 PM   Specimen: BLOOD LEFT HAND  Result Value Ref Range Status   Specimen Description   Final    BLOOD LEFT HAND Performed at [facility] [address] [place], [place] [address]    Special Requests   Final    BOTTLES DRAWN AEROBIC AND ANAEROBIC Blood Culture results may not be optimal due to an inadequate volume of blood received in culture bottles Performed at [facility] [address], [place], [place] [address]    Culture   Final    NO GROWTH < 24 HOURS Performed at [facility] [address] [place], [place] [address]    Report Status PENDING  Incomplete  Respiratory Panel by RT PCR (Flu A&B, Covid) - Nasopharyngeal Swab     Status: None   Collection Time: 12/30/19  4:00 PM   Specimen: Nasopharyngeal Swab  Result Value Ref Range Status   SARS Coronavirus 2 by RT PCR NEGATIVE NEGATIVE Final    Comment: (NOTE) SARS-CoV-2 target nucleic acids are NOT DETECTED.  The SARS-CoV-2 RNA is generally detectable in upper respiratoy specimens during the acute phase of infection. The lowest concentration of SARS-CoV-2 viral  copies this assay can detect is 131 copies/mL. A negative result does not preclude SARS-Cov-2 infection and should not be used as the sole basis for treatment or other patient management decisions. A negative result may occur with  improper specimen collection/handling, submission of specimen other than nasopharyngeal swab, presence of viral mutation(s) within the areas targeted by this assay, and inadequate number of viral copies (<131 copies/mL). A negative result must be combined with clinical observations, patient history, and epidemiological information. The expected result is Negative.  Fact Sheet for Patients:  PinkCheek.be  Fact Sheet for Healthcare Providers:  GravelBags.it  This test is no t yet  approved or cleared by the [place] and  has been authorized for detection and/or diagnosis of SARS-CoV-2 by FDA under an Emergency Use Authorization (EUA). This EUA will remain  in effect (meaning this test can be used) for the duration of the COVID-19 declaration under Section 564(b)(1) of the Act, 21 U.S.C. section 360bbb-3(b)(1), unless the authorization is terminated or revoked sooner.     Influenza A by PCR NEGATIVE NEGATIVE Final   Influenza B by PCR NEGATIVE NEGATIVE Final    Comment: (NOTE) The Xpert Xpress SARS-CoV-2/FLU/RSV assay is intended as an aid in  the diagnosis of influenza from Nasopharyngeal swab specimens and  should not be used as a sole basis for treatment. Nasal washings and  aspirates are unacceptable for Xpert Xpress SARS-CoV-2/FLU/RSV  testing.  Fact Sheet for Patients: PinkCheek.be  Fact Sheet for Healthcare Providers: GravelBags.it  This test is not yet approved or cleared by the [place] FDA and  has been authorized for detection and/or diagnosis of SARS-CoV-2 by  FDA under an Emergency Use Authorization (EUA). This  EUA will remain  in effect (meaning this test can be used) for the duration of the  Covid-19 declaration under Section 564(b)(1) of the Act, 21  U.S.C. section 360bbb-3(b)(1), unless the authorization is  terminated or revoked. Performed at [facility], [place] [address] [place], [place] [address]      Labs: BNP (last 3 results) Recent Labs    12/30/19 1149  BNP 492.0*   Basic Metabolic Panel: Recent Labs  Lab 12/30/19 1149 12/30/19 1922 12/31/19 0505  NA 138  --  137  K 4.3  --  3.7  CL 98  --  101  CO2 27  --  25  GLUCOSE 181*  --  129*  BUN 34*  --  32*  CREATININE 1.72* 1.72* 1.62*  CALCIUM 9.4  --  9.1  MG  --  1.9  --    Liver Function Tests: Recent Labs  Lab 12/30/19 1149 12/31/19 0505  AST 36 32  ALT 28 32  ALKPHOS 77 67  BILITOT 1.0 0.6  PROT 6.9 6.2*  ALBUMIN 3.9 3.4*   No results for input(s): LIPASE, AMYLASE in the last 168 hours. No results for input(s): AMMONIA in the last 168 hours. CBC: Recent Labs  Lab 12/30/19 1149 12/30/19 1922 12/31/19 0505  WBC 7.7 9.2 7.6  NEUTROABS 6.9  --   --   HGB 14.9 14.2 13.2  HCT 46.1* 44.5 41.5  MCV 90.0 89.9 90.0  PLT 120* 122* 121*   Cardiac Enzymes: No results for input(s): CKTOTAL, CKMB, CKMBINDEX, TROPONINI in the last 168 hours. BNP: Invalid input(s): POCBNP CBG: Recent Labs  Lab 12/30/19 1936 12/30/19 2244 12/31/19 0831 12/31/19 1134  GLUCAP 143* 212* 157* 156*   D-Dimer No results for input(s): DDIMER in the last 72 hours. Hgb A1c Recent Labs    12/30/19 1922  HGBA1C 8.0*   Lipid Profile No results for input(s): CHOL, HDL, LDLCALC, TRIG, CHOLHDL, LDLDIRECT in the last 72 hours. Thyroid function studies No results for input(s): TSH, T4TOTAL, T3FREE, THYROIDAB in the last 72 hours.  Invalid input(s): FREET3 Anemia work up No results for input(s): VITAMINB12, FOLATE, FERRITIN, TIBC, IRON, RETICCTPCT in the last 72 hours. Urinalysis    Component Value  Date/Time   COLORURINE STRAW (A) 12/30/2019 1436   APPEARANCEUR HAZY (A) 12/30/2019 1436   LABSPEC 1.006 12/30/2019 1436   PHURINE 5.0 12/30/2019 1436   GLUCOSEU NEGATIVE 12/30/2019  [place] (A) 12/30/2019 1436   BILIRUBINUR NEGATIVE 12/30/2019 1436   KETONESUR NEGATIVE 12/30/2019 1436   PROTEINUR NEGATIVE 12/30/2019 1436   NITRITE POSITIVE (A) 12/30/2019 1436   LEUKOCYTESUR LARGE (A) 12/30/2019 1436   Sepsis Labs Invalid input(s): PROCALCITONIN,  WBC,  LACTICIDVEN Microbiology Recent Results (from the past 240 hour(s))  Culture, blood (routine x 2)     Status: None (Preliminary result)   Collection Time: 12/30/19 12:50 PM   Specimen: BLOOD  Result Value Ref Range Status   Specimen Description   Final    BLOOD LEFT ANTECUBITAL Performed at [facility], [place] [address], [place], [place] [address]    Special Requests   Final    BOTTLES DRAWN AEROBIC AND ANAEROBIC Blood Culture adequate volume Performed at [facility] [address] [place], [place] [address]    Culture   Final    NO GROWTH < 24 HOURS Performed at [facility] [address] [place], [place] [address]    Report Status PENDING  Incomplete  Culture, blood (routine x 2)     Status: None (Preliminary result)   Collection Time: 12/30/19  1:42 PM   Specimen: BLOOD LEFT HAND  Result Value Ref Range Status   Specimen Description   Final    BLOOD LEFT HAND Performed at [facility] [address], [place], [place] [address]    Special Requests   Final    BOTTLES DRAWN AEROBIC AND ANAEROBIC Blood Culture results may not be optimal due to an inadequate volume of blood received in culture bottles Performed at [facility] [address] [place], [place] [address]    Culture   Final    NO GROWTH < 24 HOURS Performed at [facility] [address], [place], [place] [address]    Report Status PENDING   Incomplete  Respiratory Panel by RT PCR (Flu A&B, Covid) - Nasopharyngeal Swab     Status: None   Collection Time: 12/30/19  4:00 PM   Specimen: Nasopharyngeal Swab  Result Value Ref Range Status   SARS Coronavirus 2 by RT PCR NEGATIVE NEGATIVE Final    Comment: (NOTE) SARS-CoV-2 target nucleic acids are NOT DETECTED.  The SARS-CoV-2 RNA is generally detectable in upper respiratoy specimens during the acute phase of infection. The lowest concentration of SARS-CoV-2 viral copies this assay can detect is 131 copies/mL. A negative result does not preclude SARS-Cov-2 infection and should not be used as the sole basis for treatment or other patient management decisions. A negative result may occur with  improper specimen collection/handling, submission of specimen other than nasopharyngeal swab, presence of viral mutation(s) within the areas targeted by this assay, and inadequate number of viral copies (<131 copies/mL). A negative result must be combined with clinical observations, patient history, and epidemiological information. The expected result is Negative.  Fact Sheet for Patients:  PinkCheek.be  Fact Sheet for Healthcare Providers:  GravelBags.it  This test is no t yet approved or cleared by the [place] FDA and  has been authorized for detection and/or diagnosis of SARS-CoV-2 by FDA under an Emergency Use Authorization (EUA). This EUA will remain  in effect (meaning this test can be used) for the duration of the COVID-19 declaration under Section 564(b)(1) of the Act, 21 U.S.C. section 360bbb-3(b)(1), unless the authorization is terminated or revoked sooner.     Influenza A by PCR NEGATIVE NEGATIVE Final   Influenza  B by PCR NEGATIVE NEGATIVE Final    Comment: (NOTE) The Xpert Xpress SARS-CoV-2/FLU/RSV assay is intended as an aid in  the diagnosis of influenza from Nasopharyngeal swab specimens and  should not  be used as a sole basis for treatment. Nasal washings and  aspirates are unacceptable for Xpert Xpress SARS-CoV-2/FLU/RSV  testing.  Fact Sheet for Patients: PinkCheek.be  Fact Sheet for Healthcare Providers: GravelBags.it  This test is not yet approved or cleared by the [place] FDA and  has been authorized for detection and/or diagnosis of SARS-CoV-2 by  FDA under an Emergency Use Authorization (EUA). This EUA will remain  in effect (meaning this test can be used) for the duration of the  Covid-19 declaration under Section 564(b)(1) of the Act, 21  U.S.C. section 360bbb-3(b)(1), unless the authorization is  terminated or revoked. Performed at [facility], [place] [address] [place], [place] [address]      Time coordinating discharge: 32 minutes  SIGNED:   Barb Merino, MD  [facility] 12/31/2019, 12:42 PM

## 2019-12-31 NOTE — Progress Notes (Signed)
Assessment unchanged. Pt verbalized understanding of dc instructions including meds, follow up care, and when to call the doctor. Discharged via wc to front entrance accompanied by NT.

## 2020-01-01 LAB — URINE CULTURE: Culture: 70000 — AB

## 2020-01-04 LAB — CULTURE, BLOOD (ROUTINE X 2)
Culture: NO GROWTH
Culture: NO GROWTH
Special Requests: ADEQUATE

## 2020-03-14 ENCOUNTER — Other Ambulatory Visit: Payer: Medicare Other

## 2020-03-14 DIAGNOSIS — Z20822 Contact with and (suspected) exposure to covid-19: Secondary | ICD-10-CM

## 2020-03-15 LAB — SARS-COV-2, NAA 2 DAY TAT

## 2020-03-15 LAB — NOVEL CORONAVIRUS, NAA: SARS-CoV-2, NAA: NOT DETECTED

## 2020-03-29 ENCOUNTER — Other Ambulatory Visit: Payer: Self-pay | Admitting: Cardiology

## 2020-05-17 ENCOUNTER — Ambulatory Visit: Payer: Medicare Other

## 2020-05-17 ENCOUNTER — Other Ambulatory Visit: Payer: Self-pay

## 2020-05-17 DIAGNOSIS — I5022 Chronic systolic (congestive) heart failure: Secondary | ICD-10-CM

## 2020-05-17 DIAGNOSIS — I480 Paroxysmal atrial fibrillation: Secondary | ICD-10-CM

## 2020-05-30 ENCOUNTER — Ambulatory Visit: Payer: Medicare Other | Attending: Internal Medicine

## 2020-05-30 ENCOUNTER — Other Ambulatory Visit: Payer: Self-pay

## 2020-05-30 ENCOUNTER — Other Ambulatory Visit (HOSPITAL_BASED_OUTPATIENT_CLINIC_OR_DEPARTMENT_OTHER): Payer: Self-pay

## 2020-05-30 DIAGNOSIS — Z23 Encounter for immunization: Secondary | ICD-10-CM

## 2020-05-30 MED ORDER — COVID-19 MRNA VACCINE (PFIZER) 30 MCG/0.3ML IM SUSP
INTRAMUSCULAR | 0 refills | Status: DC
Start: 2020-05-30 — End: 2020-06-13
  Filled 2020-05-30: qty 0.3, 1d supply, fill #0

## 2020-05-30 NOTE — Progress Notes (Signed)
   Covid-19 Vaccination Clinic  Name:  Janet Owen    MRN: [id number] DOB: 12/22/1933  05/30/2020  [name] was observed post Covid-19 immunization for 15 minutes without incident. She was provided with Vaccine Information Sheet and instruction to access the V-Safe system.   [name] was instructed to call 911 with any severe reactions post vaccine: Marland Kitchen Difficulty breathing  . Swelling of face and throat  . A fast heartbeat  . A bad rash all over body  . Dizziness and weakness   Immunizations Administered    Name Date Dose VIS Date Route   PFIZER Comrnaty(Gray TOP) Covid-19 Vaccine 05/30/2020 11:41 AM 0.3 mL 01/21/2020 Intramuscular   Manufacturer: [employer] [place]   Lot: [id number]   NDC: [phone]

## 2020-06-13 ENCOUNTER — Ambulatory Visit: Payer: Medicare Other | Admitting: Cardiology

## 2020-06-13 ENCOUNTER — Other Ambulatory Visit: Payer: Self-pay

## 2020-06-13 ENCOUNTER — Encounter: Payer: Self-pay | Admitting: Cardiology

## 2020-06-13 VITALS — BP 138/85 | HR 98 | Temp 98.0°F | Resp 16 | Ht 67.0 in | Wt 182.0 lb

## 2020-06-13 DIAGNOSIS — N184 Chronic kidney disease, stage 4 (severe): Secondary | ICD-10-CM

## 2020-06-13 DIAGNOSIS — I5022 Chronic systolic (congestive) heart failure: Secondary | ICD-10-CM

## 2020-06-13 DIAGNOSIS — I428 Other cardiomyopathies: Secondary | ICD-10-CM

## 2020-06-13 DIAGNOSIS — E1122 Type 2 diabetes mellitus with diabetic chronic kidney disease: Secondary | ICD-10-CM

## 2020-06-13 DIAGNOSIS — Z95810 Presence of automatic (implantable) cardiac defibrillator: Secondary | ICD-10-CM

## 2020-06-13 DIAGNOSIS — I10 Essential (primary) hypertension: Secondary | ICD-10-CM

## 2020-06-13 NOTE — Progress Notes (Signed)
Primary Physician/Referring:  Bartholome Bill, MD  Patient ID: [name], female    DOB: 08/14/1933, 87 y.o.   MRN: [id number]  Chief Complaint  Patient presents with  . Congestive Heart Failure    6 month  . Hypertension  . Hyperlipidemia   HPI:    JORDANNA HENDRIE  is a 87 y.o. Caucasian female with nonischemic cardiomyopathy diagnosed in 52 in [place] with severe LV systolic dysfunction, EF 94%, paroxysmal atrial fibrillation, h/o atrial flutter s/p ablation on 07/31/2015, and has been on chronic amiodarone. She has bi-V ICD placed on 11/09/2015, by Dr. Crissie Sickles.  Past medical history significant for hypertension, hyperlipidemia, type 2 diabetes mellitus and stage IV chronic kidney disease.  This is her 6 month visit.  She is presently doing well and has not had any recent hospitalization or decompensated heart failure.  Denies chest pain or dyspnea.  She is tolerating all her medications well.  Past Medical History:  Diagnosis Date  . Atrial fibrillation ([place])   . DM (diabetes mellitus) ([place])   . Heart failure ([place])   . Hypertension    Past Surgical History:  Procedure Laterality Date  . A-FLUTTER ABLATION N/A 07/30/2016   Procedure: A-Flutter Ablation;  Surgeon: Evans Lance, MD;  Location: [place] [facility];  Service: Cardiovascular;  Laterality: N/A;  . APPENDECTOMY    . BACK SURGERY    . EP IMPLANTABLE DEVICE N/A 11/11/2015   Procedure: BiVi Upgrade;  Surgeon: Evans Lance, MD;  Location: [place] [facility];  Service: Cardiovascular;  Laterality: N/A;  . EP IMPLANTABLE DEVICE N/A 09/15/2015   Procedure: BiV ICD Insertion CRT-D;  Surgeon: Will Meredith Leeds, MD;  Location: [facility] [facility];  Service: Cardiovascular;  Laterality: N/A;  . PARTIAL HYSTERECTOMY    . REPLACEMENT TOTAL KNEE    . TOTAL HIP ARTHROPLASTY Right 08/25/2019   Procedure: TOTAL HIP ARTHROPLASTY ANTERIOR APPROACH;  Surgeon: Paralee Cancel, MD;  Location: [facility];  Service:  Orthopedics;  Laterality: Right;  Marland Kitchen VESICOVAGINAL FISTULA CLOSURE W/ TAH     Family History  Problem Relation Age of Onset  . Asthma Mother   . Pancreatitis Mother   . Alcohol abuse Father     Social History   Tobacco Use  . Smoking status: Never Smoker  . Smokeless tobacco: Never Used  Substance Use Topics  . Alcohol use: No    Alcohol/week: 0.0 standard drinks   ROS  Review of Systems  Cardiovascular: Positive for dyspnea on exertion (stable) and leg swelling (occasional). Negative for chest pain.  Musculoskeletal: Positive for back pain and joint pain.  Gastrointestinal: Negative for melena.   Objective  Blood pressure 138/85, pulse 98, temperature 98 F (36.7 C), temperature source Temporal, resp. rate 16, height 5' 7"  (1.702 m), weight 182 lb (82.6 kg), SpO2 97 %.  Vitals with BMI 06/13/2020 12/31/2019 12/31/2019  Height 5' 7"  - -  Weight 182 lbs - -  BMI 32.7 - -  Systolic 614 709 295  Diastolic 85 79 61  Pulse 98 67 60     Physical Exam Cardiovascular:     Rate and Rhythm: Normal rate and regular rhythm.     Pulses: Intact distal pulses.     Heart sounds: Normal heart sounds. No murmur heard. No gallop.      Comments: No leg edema, no JVD. Bilateral superficial varicose veins noted. Pulmonary:     Effort: Pulmonary effort is normal.  Breath sounds: Normal breath sounds.  Abdominal:     General: Bowel sounds are normal.     Palpations: Abdomen is soft.    Laboratory examination:   CMP Latest Ref Rng & Units 12/31/2019 12/30/2019 12/30/2019  Glucose 70 - 99 mg/dL 129(H) - 181(H)  BUN 8 - 23 mg/dL 32(H) - 34(H)  Creatinine 0.44 - 1.00 mg/dL 1.62(H) 1.72(H) 1.72(H)  Sodium 135 - 145 mmol/L 137 - 138  Potassium 3.5 - 5.1 mmol/L 3.7 - 4.3  Chloride 98 - 111 mmol/L 101 - 98  CO2 22 - 32 mmol/L 25 - 27  Calcium 8.9 - 10.3 mg/dL 9.1 - 9.4  Total Protein 6.5 - 8.1 g/dL 6.2(L) - 6.9  Total Bilirubin 0.3 - 1.2 mg/dL 0.6 - 1.0  Alkaline Phos 38 - 126 U/L 67  - 77  AST 15 - 41 U/L 32 - 36  ALT 0 - 44 U/L 32 - 28   CBC Latest Ref Rng & Units 12/31/2019 12/30/2019 12/30/2019  WBC 4.0 - 10.5 K/uL 7.6 9.2 7.7  Hemoglobin 12.0 - 15.0 g/dL 13.2 14.2 14.9  Hematocrit 36.0 - 46.0 % 41.5 44.5 46.1(H)  Platelets 150 - 400 K/uL 121(L) 122(L) 120(L)    HEMOGLOBIN A1C Lab Results  Component Value Date   HGBA1C 8.0 (H) 12/30/2019   MPG 182.9 12/30/2019   TSH No results for input(s): TSH in the last 8760 hours.  Labs 10/23/2019:  A1c 7.4%.  Sodium 139, potassium 4.9, BUN 39, creatinine 1.70, EGFR 27 mL.  Total cholesterol 150, triglycerides 159, HDL 61, LDL 57.  External labs:   Labs 05/11/2020:  Sodium 136, potassium 4.7, BUN 40, creatinine 2.07, EGFR 23 mL.  Hb 13.8/HCT 42.4, platelets 117.  A1c 7.8%.  Vitamin D 28.  Labs 01/19/2019: A1c 7.6%.  Vitamin D 29.  Total cholesterol 148, TG 230, HDL 41, LDL 61   Sodium 131, potassium 4.4, BUN 35, S. Cr 1.55, EGFR 31 mL.  CMP otherwise normal.  Hb 15.2/HCT 45.7, platelets 142.  Cholesterol, total 148.000 M 11/18/2015 HDL 50.000 M 11/18/2015 LDL 78 Triglycerides 130.000 M 11/18/2015  A1C 6.800 % 11/18/2015 TSH 2.230 07/05/2017  Hemoglobin 11.000 G/ 07/05/2017 Platelets 209.000 X 07/05/2017  Creatinine, Serum 1.750 MG/ 07/05/2017 Potassium 5.400 07/23/2016 ALT (SGPT) 20.000 IU/ 07/05/2017  Medications and allergies   Allergies  Allergen Reactions  . Exenatide Nausea Only    Dizziness  . Raloxifene Other (See Comments)    hot flashes    Current Outpatient Medications on File Prior to Visit  Medication Sig Dispense Refill  . amiodarone (PACERONE) 200 MG tablet TAKE 1/2 TABLET BY MOUTH EVERY DAY 90 tablet 3  . atorvastatin (LIPITOR) 20 MG tablet Take 20 mg by mouth daily.    Marland Kitchen bismuth subsalicylate (PEPTO BISMOL) 262 MG/15ML suspension Take 30 mLs by mouth daily as needed for indigestion or diarrhea or loose stools.    . carvedilol (COREG) 12.5 MG tablet TAKE 1 TABLET BY MOUTH TWICE  DAILY (Patient taking differently: Take 12.5 mg by mouth 2 (two) times daily with a meal.) 180 tablet 3  . diphenhydramine-acetaminophen (TYLENOL PM) 25-500 MG TABS tablet Take 2 tablets by mouth at bedtime.    Marland Kitchen ezetimibe (ZETIA) 10 MG tablet Take one-half tablet (5 mg) by mouth daily    . famotidine (PEPCID) 20 MG tablet Take 1 tablet (20 mg total) by mouth 2 (two) times daily. 60 tablet 1  . furosemide (LASIX) 40 MG tablet TAKE 1 TABLET BY MOUTH DAILY (  Patient taking differently: Take 40 mg by mouth daily.) 90 tablet 3  . glucose blood (ONETOUCH VERIO) test strip USE TO TEST BLOOD SUGAR TWICE DAILY    . insulin glargine, 1 Unit Dial, (TOUJEO SOLOSTAR) 300 UNIT/ML Solostar Pen Inject 15 Units into the skin at bedtime. (Patient taking differently: Inject 30 Units into the skin at bedtime.) 1.5 pen 0  . Insulin Pen Needle (B-D UF III MINI PEN NEEDLES) 31G X 5 MM MISC Use to inject Toujeo once daily.    Marland Kitchen JANUVIA 50 MG tablet Take 0.5 tablets (25 mg total) by mouth daily. (Patient taking differently: Take 50 mg by mouth daily.) 15 tablet 0  . nateglinide (STARLIX) 120 MG tablet Take 60 mg by mouth 3 (three) times daily before meals.     No current facility-administered medications on file prior to visit.    Radiology:   No results found.  Cardiac Studies:   Coronary Angiogram  [02/04/2014]:  Left and right heart catheterization 02/04/2014: Right atrial pressure 10 mmHg. Right ventricular pressure 32/2 with RVEDP of 15 mmHg. PA pressure 29/15 with mean of 22 mmHg. Pulmonary capillary wedge pressure mean of 6 mmHg. PA saturation 64% and aortic saturation 92%. Fick cardiac output of 5.6 L and index 2.9 L/m. LVEF 55%.  Normal coronary arteries.  Echocardiogram 05/17/2020: Left ventricle cavity is normal in size. Mild concentric hypertrophy of the left ventricle. Normal global wall motion. Normal LV systolic function with EF 68%. Doppler evidence of grade II (pseudonormal) diastolic dysfunction,  elevated LAP.  Left atrial cavity is mildly dilated. Mild (Grade I) aortic regurgitation. Moderate calcification of the mitral valve annulus. Mildly restricted mitral valve leaflets. Mild (Grade I) mitral regurgitation. Mild to moderate tricuspid regurgitation. Estimated pulmonary artery systolic pressure 28 mmHg. No significant change compared to previous study on 08/08/2017.   Device clinic CRTD 09/15/2015:  [place]    Scheduled  In office ICD 02/25/19  Single (S)/Dual (D)/BV (M) BV Presenting AVBV 75/min Pacer dependant: Not. Underlying [place] @ 14 with 1st degree AVB.  AP 90%, VP NA%. BP 100.% AMS Episodes 0.  HVR 0.  Longevity 3.6 Years/Voltage. Charge time 9.4 Sec. Lead measurements: Stable Histogram: Low (L)/normal (N)/high (H)  Flat. Patient activity Good. Thoracic impedance: Trending up but no trigger for CHF  Observations: normal ICD function.  Changes: Increased slope from 11 to 12 for rate response.   Schedule remote ICD transmission 05/18/2020: Longevity >2 years.  A paced 94%, BiV paced 99%.  There were frequent mode switches, EGM revealed brief atrial tachycardia.  There were no HVR episodes.  Impedance is normal with no suggestion of fluid overload state.  Normal ICD function.  EKG:     EKG 06/13/2020: AV paced rhythm at rate of 64 bpm, biventricular pacemaker detected.  No further analysis.  Assessment     ICD-10-CM   1. Chronic systolic congestive heart failure (HCC)  I50.22   2. NICM (nonischemic cardiomyopathy) ([place])  I42.8   3. ICD BV  09/15/2015:  [employer] [place] model [phone] (serial  Number [id number]) in situ  Z95.810   4. Primary hypertension  I10 EKG 12-Lead  5. Type 2 diabetes mellitus with stage 4 chronic kidney disease, with long-term current use of insulin ([place])  E11.22    N18.4    Z79.4     No orders of the defined types were placed in this encounter.   Medications Discontinued During This Encounter   Medication Reason  .  COVID-19 mRNA vaccine, Pfizer, 30 MCG/0.3ML injection Error  . Vitamin D, Cholecalciferol, 25 MCG (1000 UT) TABS Error    Recommendations:   CHANITA BODEN  is a 87 y.o. she will be 71 in 10 days, Caucasian female with nonischemic cardiomyopathy diagnosed in 70 in [place] with severe LV systolic dysfunction, EF 36%, paroxysmal atrial fibrillation, h/o atrial flutter s/p ablation on 07/31/2015, and has been on chronic amiodarone therapy,she has bi-V ICD placed on 11/09/2015, by Dr. Crissie Sickles.  Past medical history significant for hypertension, hyperlipidemia, type 2 diabetes mellitus and stage IV chronic kidney disease.   In view of chronic thrombocytopenia and stage III-IV chronic kidney disease, no recurrence of atrial fibrillation over last several years, felt to be high risk for bleeding and hence not on anticoagulation.  This is 46-monthvisit, fortunately she is presently doing well and has not had any cardiac issues or decompensated heart failure.  I reviewed her external labs, her renal function is slightly progressed from chronic kidney disease stage IIIb to stage IV.  She is now established herself with nephrology as well.  With regard to monitoring of high risk medication use, she is on amiodarone 200 mg daily that was previously started for remote atrial fibrillation which is maintaining sinus rhythm, we could consider reducing this to 100 mg daily.  She will need TSH to be done for surveillance.  LFTs are within normal limits.  Lung examination is normal.  I did not make any changes to her medications, I will see her back in 6 months for follow-up.  [facility].    JAdrian Prows MD, FIntegris Grove Hospital5/03/2020, 2:56 PM Office: [phone]: [phone]

## 2020-06-28 ENCOUNTER — Other Ambulatory Visit: Payer: Self-pay | Admitting: Cardiology

## 2020-06-28 DIAGNOSIS — I5022 Chronic systolic (congestive) heart failure: Secondary | ICD-10-CM

## 2020-07-29 ENCOUNTER — Other Ambulatory Visit: Payer: Self-pay | Admitting: Cardiology

## 2020-12-14 ENCOUNTER — Encounter: Payer: Self-pay | Admitting: Cardiology

## 2020-12-14 ENCOUNTER — Ambulatory Visit: Payer: Medicare Other | Admitting: Cardiology

## 2020-12-14 ENCOUNTER — Other Ambulatory Visit: Payer: Self-pay

## 2020-12-14 VITALS — BP 124/89 | HR 87 | Temp 97.7°F | Resp 16 | Ht 67.0 in | Wt 186.8 lb

## 2020-12-14 DIAGNOSIS — I5022 Chronic systolic (congestive) heart failure: Secondary | ICD-10-CM

## 2020-12-14 DIAGNOSIS — E1122 Type 2 diabetes mellitus with diabetic chronic kidney disease: Secondary | ICD-10-CM

## 2020-12-14 DIAGNOSIS — Z794 Long term (current) use of insulin: Secondary | ICD-10-CM

## 2020-12-14 DIAGNOSIS — K219 Gastro-esophageal reflux disease without esophagitis: Secondary | ICD-10-CM

## 2020-12-14 DIAGNOSIS — Z95810 Presence of automatic (implantable) cardiac defibrillator: Secondary | ICD-10-CM

## 2020-12-14 DIAGNOSIS — I10 Essential (primary) hypertension: Secondary | ICD-10-CM

## 2020-12-14 DIAGNOSIS — Z4502 Encounter for adjustment and management of automatic implantable cardiac defibrillator: Secondary | ICD-10-CM

## 2020-12-14 MED ORDER — OMEPRAZOLE 20 MG PO CPDR: 20.0000 mg | DELAYED_RELEASE_CAPSULE | Freq: Every day | ORAL | 6 refills | Status: DC | PRN

## 2020-12-14 NOTE — Progress Notes (Signed)
Primary Physician/Referring:  Berkley Harvey, NP  Patient ID: Janet Owen, female    DOB: 08-04-1933, 87 y.o.   MRN: [id number]  Chief Complaint  Patient presents with   Congestive Heart Failure   Hypertension   Follow-up    6 months   HPI:    Janet Owen  is a 87 y.o. Caucasian female with nonischemic cardiomyopathy diagnosed in 25 in [place] with severe LV systolic dysfunction, EF 94%, paroxysmal atrial fibrillation, h/o atrial flutter s/p ablation on 07/31/2015, and has been on chronic amiodarone. She has bi-V ICD placed on 11/09/2015, by Dr. Crissie Sickles.  Past medical history significant for hypertension, hyperlipidemia, type 2 diabetes mellitus and stage IV chronic kidney disease.  This is her 6 month visit.  She is presently doing well and has not had any recent hospitalization or decompensated heart failure.  Denies chest pain or dyspnea.  She is tolerating all her medications well.  She complains of acid reflux occasionally.  Past Medical History:  Diagnosis Date   Atrial fibrillation ([place])    DM (diabetes mellitus) ([place])    Heart failure ([place])    Hypertension    Past Surgical History:  Procedure Laterality Date   A-FLUTTER ABLATION N/A 07/30/2016   Procedure: A-Flutter Ablation;  Surgeon: Evans Lance, MD;  Location: [facility];  Service: Cardiovascular;  Laterality: N/A;   APPENDECTOMY     BACK SURGERY     EP IMPLANTABLE DEVICE N/A 11/11/2015   Procedure: BiVi Upgrade;  Surgeon: Evans Lance, MD;  Location: [place] [facility];  Service: Cardiovascular;  Laterality: N/A;   EP IMPLANTABLE DEVICE N/A 09/15/2015   Procedure: BiV ICD Insertion CRT-D;  Surgeon: Will Meredith Leeds, MD;  Location: [facility] [facility];  Service: Cardiovascular;  Laterality: N/A;   PARTIAL HYSTERECTOMY     REPLACEMENT TOTAL KNEE     TOTAL HIP ARTHROPLASTY Right 08/25/2019   Procedure: TOTAL HIP ARTHROPLASTY ANTERIOR APPROACH;  Surgeon: Paralee Cancel, MD;  Location: [facility];  Service: Orthopedics;  Laterality: Right;   VESICOVAGINAL FISTULA CLOSURE W/ TAH     Family History  Problem Relation Age of Onset   Asthma Mother    Pancreatitis Mother    Alcohol abuse Father     Social History   Tobacco Use   Smoking status: Never   Smokeless tobacco: Never  Substance Use Topics   Alcohol use: No    Alcohol/week: 0.0 standard drinks   ROS  Review of Systems  Cardiovascular:  Positive for dyspnea on exertion (stable). Negative for chest pain and leg swelling.  Musculoskeletal:  Positive for back pain and joint pain.  Gastrointestinal:  Positive for heartburn. Negative for melena.  Objective  Blood pressure 124/89, pulse 87, temperature 97.7 F (36.5 C), temperature source Temporal, resp. rate 16, height 5' 7"  (1.702 m), weight 186 lb 12.8 oz (84.7 kg), SpO2 98 %.  Vitals with BMI 12/14/2020 06/13/2020 12/31/2019  Height 5' 7"  5' 7"  -  Weight 186 lbs 13 oz 182 lbs -  BMI 76.54 65.0 -  Systolic 354 656 812  Diastolic 89 85 79  Pulse 87 98 67     Physical Exam Neck:     Vascular: No carotid bruit or JVD.  Cardiovascular:     Rate and Rhythm: Normal rate and regular rhythm.     Pulses: Intact distal pulses.     Heart sounds: Normal heart sounds. No murmur heard.   No gallop.  Comments: Bilateral superficial varicose veins noted. Pulmonary:     Effort: Pulmonary effort is normal.     Breath sounds: Normal breath sounds.  Abdominal:     General: Bowel sounds are normal.     Palpations: Abdomen is soft.  Musculoskeletal:     Right lower leg: No edema.     Left lower leg: No edema.   Laboratory examination:   CMP Latest Ref Rng & Units 12/31/2019 12/30/2019 12/30/2019  Glucose 70 - 99 mg/dL 129(H) - 181(H)  BUN 8 - 23 mg/dL 32(H) - 34(H)  Creatinine 0.44 - 1.00 mg/dL 1.62(H) 1.72(H) 1.72(H)  Sodium 135 - 145 mmol/L 137 - 138  Potassium 3.5 - 5.1 mmol/L 3.7 - 4.3  Chloride 98 - 111 mmol/L 101 - 98  CO2 22 - 32 mmol/L 25 - 27  Calcium 8.9  - 10.3 mg/dL 9.1 - 9.4  Total Protein 6.5 - 8.1 g/dL 6.2(L) - 6.9  Total Bilirubin 0.3 - 1.2 mg/dL 0.6 - 1.0  Alkaline Phos 38 - 126 U/L 67 - 77  AST 15 - 41 U/L 32 - 36  ALT 0 - 44 U/L 32 - 28   CBC Latest Ref Rng & Units 12/31/2019 12/30/2019 12/30/2019  WBC 4.0 - 10.5 K/uL 7.6 9.2 7.7  Hemoglobin 12.0 - 15.0 g/dL 13.2 14.2 14.9  Hematocrit 36.0 - 46.0 % 41.5 44.5 46.1(H)  Platelets 150 - 400 K/uL 121(L) 122(L) 120(L)    HEMOGLOBIN A1C Lab Results  Component Value Date   HGBA1C 8.0 (H) 12/30/2019   MPG 182.9 12/30/2019   TSH No results for input(s): TSH in the last 8760 hours.   External labs:  Labs 07/19/2020:  A1c 7.5%.  TSH normal at 3.31.  BUN 40, creatinine 2.15, potassium 5.2.  eGFR 22 mL.  Labs 10/23/2019:  A1c 7.4%.  Sodium 139, potassium 4.9, BUN 39, creatinine 1.70, EGFR 27 mL.  Total cholesterol 150, triglycerides 159, HDL 61, LDL 57.  External labs:   Labs 05/11/2020:  Sodium 136, potassium 4.7, BUN 40, creatinine 2.07, EGFR 23 mL.  Hb 13.8/HCT 42.4, platelets 117.  A1c 7.8%.  Vitamin D 28.  Labs 01/19/2019: A1c 7.6%.  Vitamin D 29.  Total cholesterol 148, TG 230, HDL 41, LDL 61   Sodium 131, potassium 4.4, BUN 35, S. Cr 1.55, EGFR 31 mL.  CMP otherwise normal.  Hb 15.2/HCT 45.7, platelets 142.  Cholesterol, total 148.000 M 11/18/2015 HDL 50.000 M 11/18/2015 LDL 78 Triglycerides 130.000 M 11/18/2015  A1C 6.800 % 11/18/2015 TSH 2.230 07/05/2017  Hemoglobin 11.000 G/ 07/05/2017 Platelets 209.000 X 07/05/2017  Creatinine, Serum 1.750 MG/ 07/05/2017 Potassium 5.400 07/23/2016 ALT (SGPT) 20.000 IU/ 07/05/2017  Medications and allergies   Allergies  Allergen Reactions   Exenatide Nausea Only    Dizziness   Raloxifene Other (See Comments)    hot flashes    Current Outpatient Medications on File Prior to Visit  Medication Sig Dispense Refill   amiodarone (PACERONE) 200 MG tablet TAKE 1/2 TABLET BY MOUTH EVERY DAY 90 tablet 3   atorvastatin  (LIPITOR) 20 MG tablet Take 20 mg by mouth daily.     bismuth subsalicylate (PEPTO BISMOL) 262 MG/15ML suspension Take 30 mLs by mouth daily as needed for indigestion or diarrhea or loose stools.     carvedilol (COREG) 12.5 MG tablet TAKE 1 TABLET BY MOUTH TWICE DAILY 180 tablet 3   diphenhydramine-acetaminophen (TYLENOL PM) 25-500 MG TABS tablet Take 2 tablets by mouth at bedtime.  ezetimibe (ZETIA) 10 MG tablet Take one-half tablet (5 mg) by mouth daily     famotidine (PEPCID) 20 MG tablet Take 1 tablet (20 mg total) by mouth 2 (two) times daily. 60 tablet 1   furosemide (LASIX) 40 MG tablet TAKE 1 TABLET BY MOUTH DAILY 90 tablet 3   glucose blood (ONETOUCH VERIO) test strip USE TO TEST BLOOD SUGAR TWICE DAILY     insulin glargine, 1 Unit Dial, (TOUJEO SOLOSTAR) 300 UNIT/ML Solostar Pen Inject 15 Units into the skin at bedtime. (Patient taking differently: Inject 30 Units into the skin at bedtime.) 1.5 pen 0   Insulin Pen Needle (B-D UF III MINI PEN NEEDLES) 31G X 5 MM MISC Use to inject Toujeo once daily.     JANUVIA 50 MG tablet Take 0.5 tablets (25 mg total) by mouth daily. (Patient taking differently: Take 50 mg by mouth daily.) 15 tablet 0   nateglinide (STARLIX) 120 MG tablet Take 60 mg by mouth 3 (three) times daily before meals.     No current facility-administered medications on file prior to visit.    Radiology:   No results found.  Cardiac Studies:   Coronary Angiogram   [02/13/2014]:  Left and right heart catheterization 02-13-2014: Right atrial pressure 10 mmHg. Right ventricular pressure 32/2 with RVEDP of 15 mmHg. PA pressure 29/15 with mean of 22 mmHg. Pulmonary capillary wedge pressure mean of 6 mmHg. PA saturation 64% and aortic saturation 92%. Fick cardiac output of 5.6 L and index 2.9 L/m. LVEF 55%.  Normal coronary arteries.  Echocardiogram 05/17/2020: Left ventricle cavity is normal in size. Mild concentric hypertrophy of the left ventricle. Normal global wall  motion. Normal LV systolic function with EF 68%. Doppler evidence of grade II (pseudonormal) diastolic dysfunction, elevated LAP.  Left atrial cavity is mildly dilated. Mild (Grade I) aortic regurgitation. Moderate calcification of the mitral valve annulus. Mildly restricted mitral valve leaflets. Mild (Grade I) mitral regurgitation. Mild to moderate tricuspid regurgitation. Estimated pulmonary artery systolic pressure 28 mmHg. No significant change compared to previous study on 08/08/2017.   Device clinic CRTD 09/15/2015:  [place]    Remote ICD transmission 12/03/2020: AP 93%, CRT 98%.  Longevity 1 year and 11 months.  There were no high ventricular rate episodes, there were brief atrial tachycardia episodes.  Normal CRT-D function.  Thoracic impedance suggest fluid overload state but appears to be back to baseline.  Scheduled  In office ICD 12/14/20  Single (S)/Dual (D)/BV (M) D Presenting ASBP Pacer dependant: No. Underlying Sinus with 1st degree AV block @ 60/min. AP 92%, BP 98.% AMS Episodes Yes.  AT/AF burden <0.1%. Longest Brief AT, PAC. Longest 6 seconds HVR 0.   Longevity 1.9 Years/Voltage.  Lead measurements: Stable Histogram: Low (L)/normal (N)/high (H)  Normal Patient activity Good. Thoracic impedance: Baseline  Observations: [place] BV ICD function.  Changes: Decreased Atrial voltage from 2.5 to 2.0. (Capture at 0.19m) to improve battery life.   EKG:   EKG 12/14/2020: Normal sinus rhythm at rate of 65 bpm.  Ventricularly paced rhythm, biventricular pacemaker detected.  No further analysis.  No significant change from 06/13/2020.  Assessment     ICD-10-CM   1. Encounter for management of biventricular implantable cardioverter-defibrillator (ICD)  Z45.02     2. Chronic systolic congestive heart failure (HCC)  I50.22 EKG 12-Lead    3. ICD BV  09/15/2015:  [place]model [id number](serial  Number [id number] in situ  [phone]  4.  Essential (primary) hypertension  I10     5. Type 2 diabetes mellitus with stage 4 chronic kidney disease, with long-term current use of insulin (HCC)  E11.22    N18.4    Z79.4     6. Gastroesophageal reflux disease without esophagitis  K21.9 omeprazole (PRILOSEC) 20 MG capsule      Meds ordered this encounter  Medications   omeprazole (PRILOSEC) 20 MG capsule    Sig: Take 1 capsule (20 mg total) by mouth daily as needed (Acid reflux). During severe episodes take 2 capsules until symptoms resolved    Dispense:  30 capsule    Refill:  6     There are no discontinued medications.   Recommendations:   Janet Owen  is a 87 y.o. she will be 60 in 10 days, Caucasian female with nonischemic cardiomyopathy diagnosed in 47 in [place] with severe LV systolic dysfunction, EF 42%, paroxysmal atrial fibrillation, h/o atrial flutter s/p ablation on 07/31/2015, and has been on chronic amiodarone therapy, she has bi-V ICD placed on 11/09/2015, by Dr. Crissie Sickles.  Past medical history significant for hypertension, hyperlipidemia, type 2 diabetes mellitus and stage IV chronic kidney disease.   In view of chronic thrombocytopenia and stage III-IV chronic kidney disease, no recurrence of atrial fibrillation over last several years, felt to be high risk for bleeding and hence not on anticoagulation.  This is 11-monthvisit, fortunately she is presently doing well and has not had any cardiac issues or decompensated heart failure.  I reviewed her external labs, her renal function stable over the past 6 months at stage IV.  This is closely being followed by nephrology.  Diabetes continues to be still uncontrolled.  She is not in any acute decompensated heart failure, ICD function is normal, we changed the voltage for the atrial lead to improve longevity.  She has been having significant symptoms of GERD, prescribed omeprazole for as needed use.  Otherwise blood pressure is well controlled, no changes  were done with the medications, I will see her back in 6 months for follow-up.  She is not on an ACE or ARB due to renal failure.      JAdrian Prows MD, FBarn[facility]11/03/2020, 11:29 AM Office: [phone]: [phone]

## 2021-01-09 IMAGING — RF DG HIP (WITH PELVIS) OPERATIVE*R*
1 series · 4 of 4 positions shown · non-contrast
Comparison: Preoperative radiograph yesterday.

CLINICAL DATA: Right total hip replacement.

EXAM:
OPERATIVE RIGHT HIP (WITH PELVIS IF PERFORMED)
TECHNIQUE: Fluoroscopic spot image(s) were submitted for interpretation
post-operatively.

[Series 1: unknown protocol · 0.20mm/px · 4 of 4 slices shown]
[im 1/4]
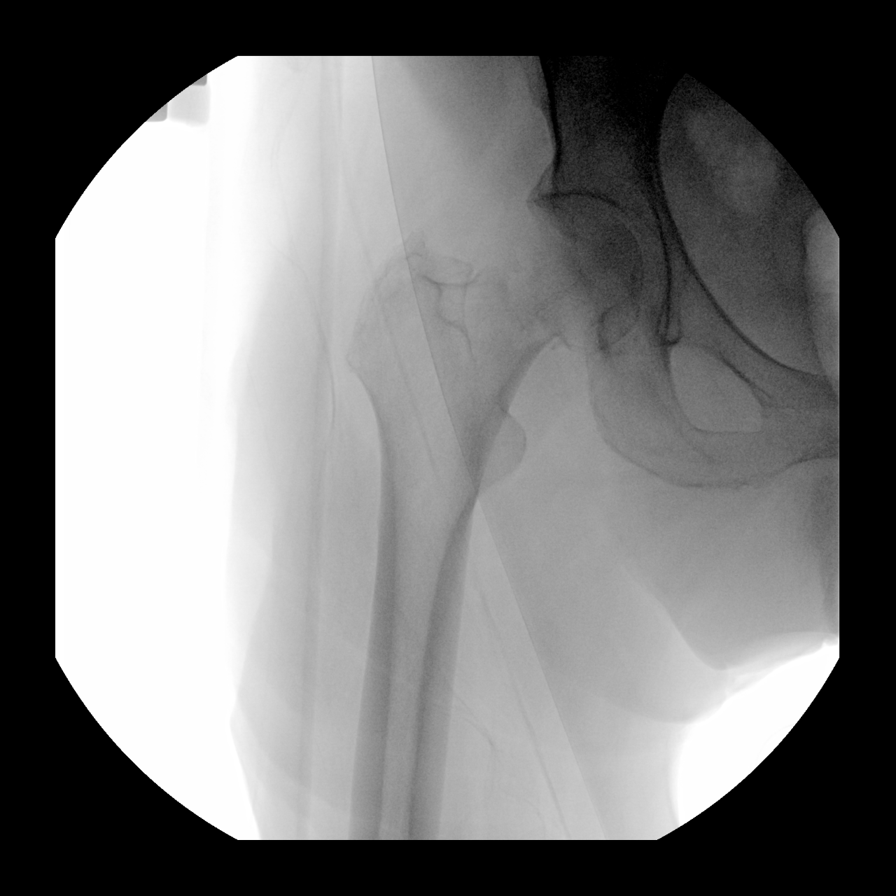
[im 2/4]
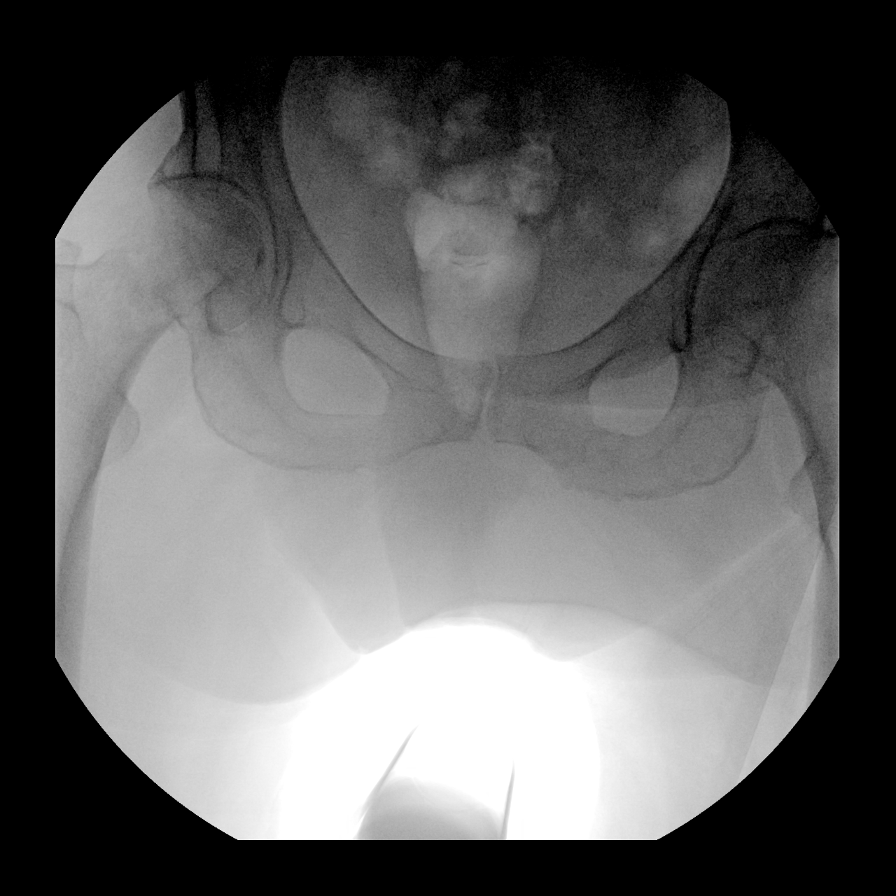
[im 3/4]
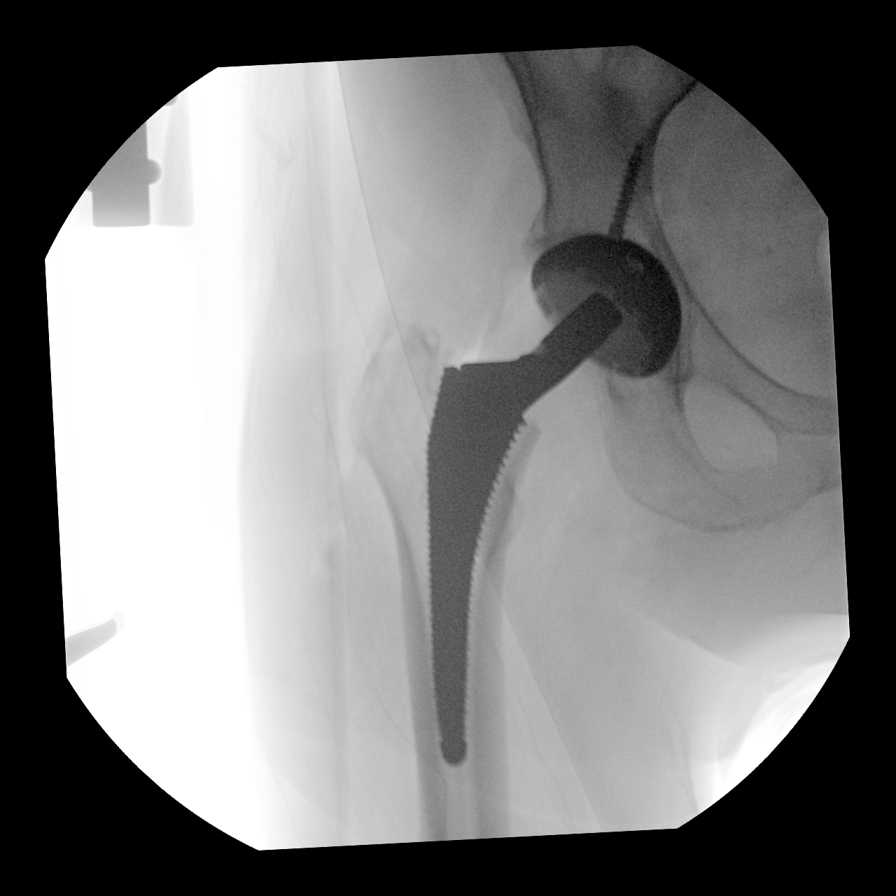
[im 4/4]
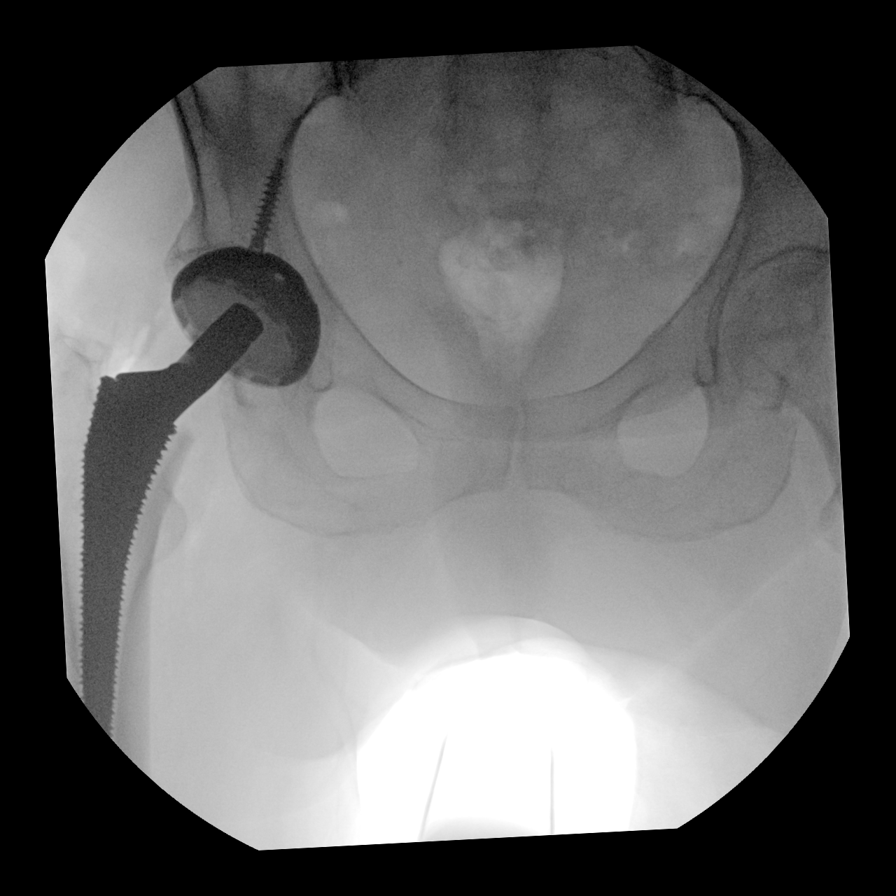

[4 of 4 positions shown; findings below may reference images not displayed]

FINDINGS: Four fluoroscopic spot views obtained in the operating room. Right
femoral neck fracture with interval right hip arthroplasty. Total
fluoroscopy time 7 seconds. Total dose 0.9 mGy.
IMPRESSION: Procedural fluoroscopy during right hip arthroplasty.

## 2021-03-16 ENCOUNTER — Telehealth: Payer: Self-pay

## 2021-03-16 NOTE — Telephone Encounter (Signed)
Called patient, NA, LMAM  Called patient husband Hansell, [place], [place].  Also documented in [place].

## 2021-03-17 ENCOUNTER — Telehealth: Payer: Self-pay | Admitting: Cardiology

## 2021-03-17 NOTE — Telephone Encounter (Signed)
Patient says someone called her in regards to a monitor, but she isn't sure who. She thinks it may have been in regards to the transmission not reading correctly, but she says she was in the middle of moving so that could be why. Now it is set up she says. Can contact her at [phone].

## 2021-03-18 ENCOUNTER — Encounter: Payer: Self-pay | Admitting: Cardiology

## 2021-03-24 NOTE — Telephone Encounter (Signed)
This has been done. She transmitted and is no longer on overdue transmission list.

## 2021-04-04 ENCOUNTER — Telehealth: Payer: Self-pay

## 2021-04-04 ENCOUNTER — Other Ambulatory Visit: Payer: Self-pay | Admitting: Cardiology

## 2021-04-04 DIAGNOSIS — I480 Paroxysmal atrial fibrillation: Secondary | ICD-10-CM

## 2021-04-04 MED ORDER — AMIODARONE HCL 200 MG PO TABS: 100.0000 mg | ORAL_TABLET | Freq: Every day | ORAL | 3 refills | Status: DC

## 2021-04-04 NOTE — Telephone Encounter (Signed)
I sent it  

## 2021-04-04 NOTE — Telephone Encounter (Signed)
Pharmacy requesting refill for amiodarone 200 mg. Okay to refill?

## 2021-05-16 IMAGING — DX DG CHEST 1V PORT
1 series · 1 of 1 positions shown · non-contrast
Comparison: August 24, 2019.

CLINICAL DATA: Chest pain.

EXAM:
PORTABLE CHEST 1 VIEW

[chest ap]
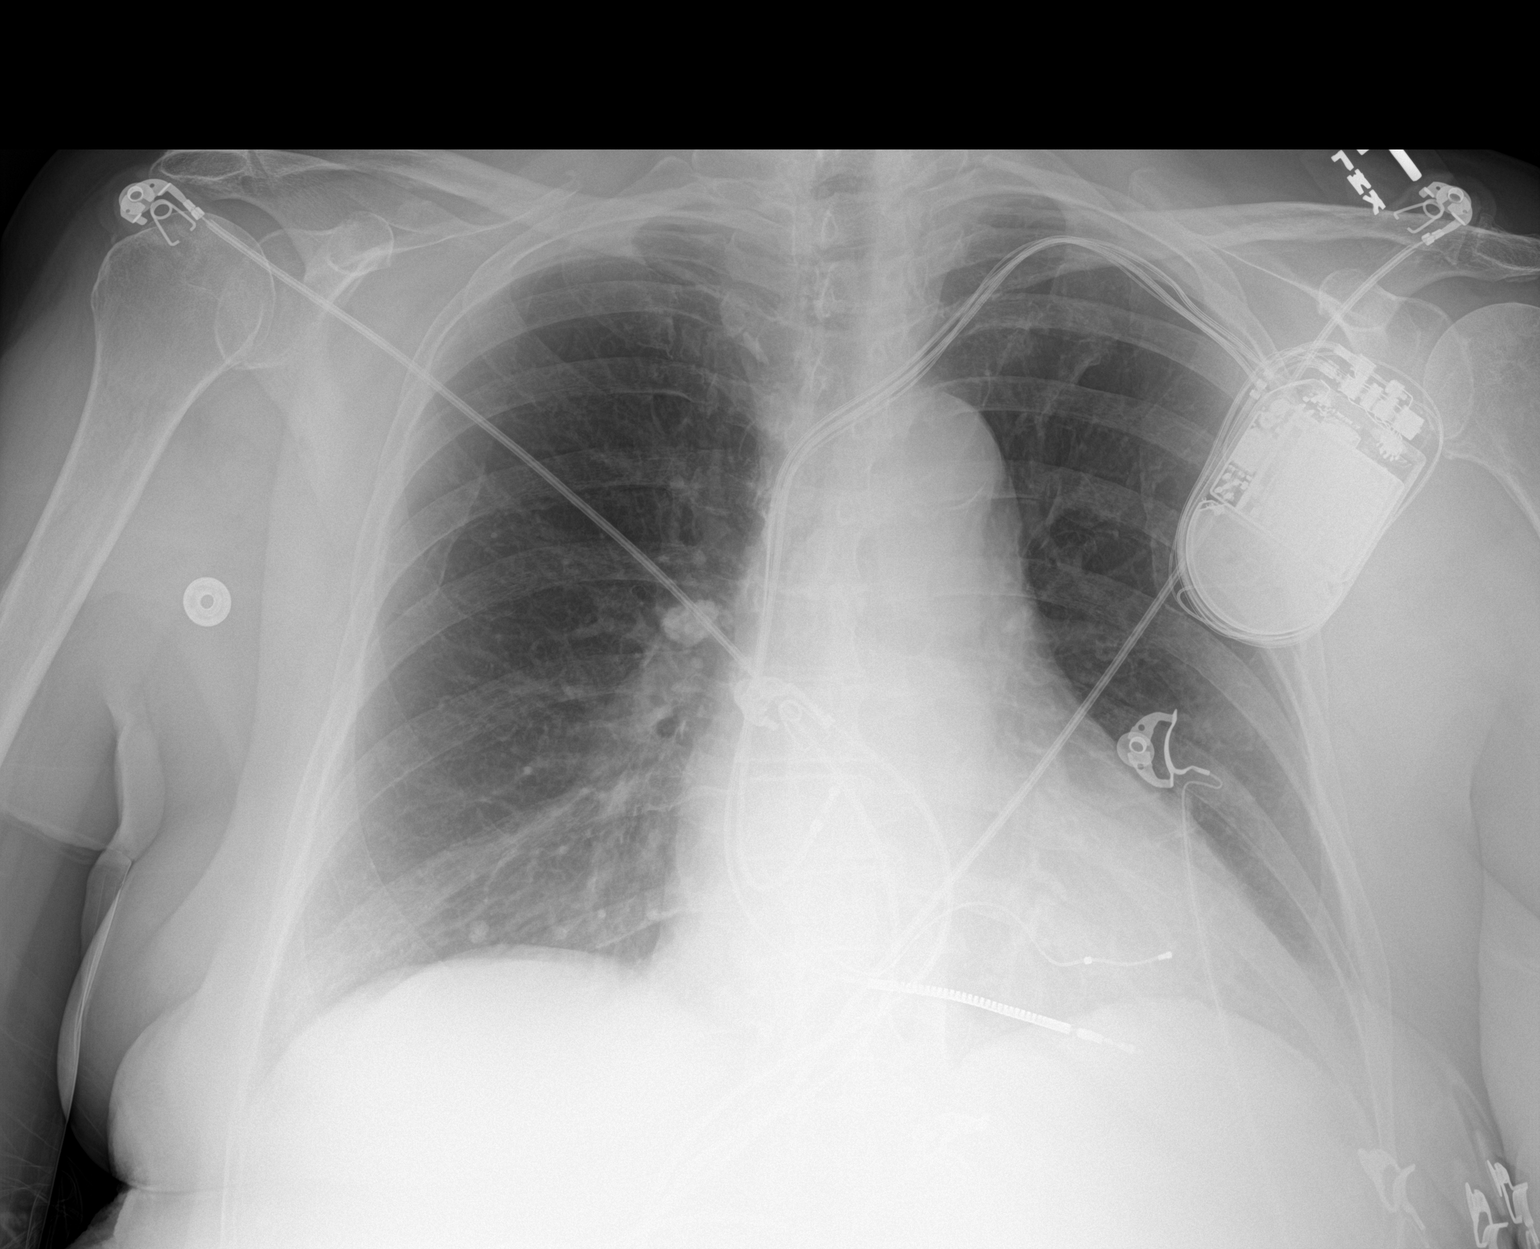

[1 of 1 positions shown; findings below may reference images not displayed]

FINDINGS: Cardiac silhouette is upper limits of normal in size, similar to
prior. Similar positioning of a left complaining of approach cardiac
rhythm maintenance device. Aortic atherosclerosis. Dense right lower
lung nodule, compatible with a granuloma. Calcified right hilar
lymph node, similar to prior. No consolidation. No visible pleural
effusions or pneumothorax. The visualized skeletal structures are
unremarkable.
IMPRESSION: No acute cardiopulmonary disease.

## 2021-06-14 ENCOUNTER — Ambulatory Visit: Payer: Medicare Other | Admitting: Cardiology

## 2021-06-19 ENCOUNTER — Other Ambulatory Visit: Payer: Self-pay | Admitting: Cardiology

## 2021-06-19 DIAGNOSIS — I5022 Chronic systolic (congestive) heart failure: Secondary | ICD-10-CM

## 2021-06-28 ENCOUNTER — Encounter: Payer: Self-pay | Admitting: Cardiology

## 2021-06-28 ENCOUNTER — Ambulatory Visit: Payer: Medicare Other | Admitting: Cardiology

## 2021-06-28 VITALS — BP 131/81 | HR 74 | Temp 98.3°F | Resp 16 | Ht 67.0 in | Wt 184.4 lb

## 2021-06-28 DIAGNOSIS — I5022 Chronic systolic (congestive) heart failure: Secondary | ICD-10-CM

## 2021-06-28 DIAGNOSIS — I480 Paroxysmal atrial fibrillation: Secondary | ICD-10-CM

## 2021-06-28 DIAGNOSIS — N184 Chronic kidney disease, stage 4 (severe): Secondary | ICD-10-CM

## 2021-06-28 DIAGNOSIS — Z95810 Presence of automatic (implantable) cardiac defibrillator: Secondary | ICD-10-CM

## 2021-06-28 MED ORDER — CARVEDILOL 12.5 MG PO TABS: 12.5000 mg | ORAL_TABLET | Freq: Two times a day (BID) | ORAL | 3 refills | Status: DC

## 2021-06-28 NOTE — Progress Notes (Signed)
? ?Primary Physician/Referring:  Berkley Harvey, NP ? ?Patient ID: Janet Owen, female    DOB: [birth year], 88 y.o.   MRN: [id number] ? ?Chief Complaint  ?Patient presents with  ? Chronic systolic congestive heart failure   ? Follow-up  ?  6 months  ? ?HPI:   ? ?Janet Owen  is a 88 y.o. Caucasian female with nonischemic cardiomyopathy diagnosed in 3 in [place] with severe LV systolic dysfunction, EF 99%, paroxysmal atrial fibrillation, h/o atrial flutter s/p ablation on 07/31/2015, and has been on chronic amiodarone. She has bi-V ICD placed on 11/09/2015, by Dr. Crissie Sickles.  Past medical history significant for hypertension, hyperlipidemia, type 2 diabetes mellitus and stage IV chronic kidney disease. ? ?This is her 6 month visit.  She is presently doing well and has not had any recent hospitalization or decompensated heart failure.  In view of chronic thrombocytopenia, stage IV kidney disease, fall risk, not on anticoagulation and she has not had any recurrence of atrial fibrillation over the past 2 years and also presently on amiodarone.  She has mild chronic dyspnea but mostly at her physical activity has been limited due to back pain and arthritis. ? ?Past Medical History:  ?Diagnosis Date  ? Atrial fibrillation ([place])   ? DM (diabetes mellitus) ([place])   ? Heart failure ([place])   ? Hyperlipidemia   ? Hypertension   ? ?Past Surgical History:  ?Procedure Laterality Date  ? A-FLUTTER ABLATION N/A 07/30/2016  ? Procedure: A-Flutter Ablation;  Surgeon: Evans Lance, MD;  Location: [facility];  Service: Cardiovascular;  Laterality: N/A;  ? APPENDECTOMY    ? BACK SURGERY    ? EP IMPLANTABLE DEVICE N/A 11/11/2015  ? Procedure: BiVi Upgrade;  Surgeon: Evans Lance, MD;  Location: [facility];  Service: Cardiovascular;  Laterality: N/A;  ? EP IMPLANTABLE DEVICE N/A 09/15/2015  ? Procedure: BiV ICD Insertion CRT-D;  Surgeon: Will Meredith Leeds, MD;  Location: [facility];  Service:  Cardiovascular;  Laterality: N/A;  ? PARTIAL HYSTERECTOMY    ? REPLACEMENT TOTAL KNEE    ? TOTAL HIP ARTHROPLASTY Right 08/25/2019  ? Procedure: TOTAL HIP ARTHROPLASTY ANTERIOR APPROACH;  Surgeon: Paralee Cancel, MD;  Location: [facility];  Service: Orthopedics;  Laterality: Right;  ? VESICOVAGINAL FISTULA CLOSURE W/ TAH    ? ?Family History  ?Problem Relation Age of Onset  ? Asthma Mother   ? Pancreatitis Mother   ? Alcohol abuse Father   ?  ?Social History  ? ?Tobacco Use  ? Smoking status: Never  ? Smokeless tobacco: Never  ?Substance Use Topics  ? Alcohol use: No  ?  Alcohol/week: 0.0 standard drinks  ? ?ROS  ?Review of Systems  ?Cardiovascular:  Positive for dyspnea on exertion (stable). Negative for chest pain and leg swelling.  ?Musculoskeletal:  Positive for back pain and joint pain.  ?Objective  ?Blood pressure 131/81, pulse 74, temperature 98.3 ?F (36.8 ?C), temperature source Temporal, resp. rate 16, height 5' 7"  (1.702 m), weight 184 lb 6.4 oz (83.6 kg), SpO2 97 %.  ? ?  06/28/2021  ? 10:58 AM 12/14/2020  ? 10:41 AM 06/13/2020  ?  1:37 PM  ?Vitals with BMI  ?Height 5' 7"  5' 7"  5' 7"   ?Weight 184 lbs 6 oz 186 lbs 13 oz 182 lbs  ?BMI 28.87 29.25 28.5  ?Systolic 371 696 789  ?Diastolic 81 89 85  ?Pulse 74 87 98  ?  ? Physical  Exam ?Neck:  ?   Vascular: No carotid bruit or JVD.  ?Cardiovascular:  ?   Rate and Rhythm: Normal rate and regular rhythm.  ?   Pulses: Intact distal pulses.  ?   Heart sounds: Normal heart sounds. No murmur heard. ?  No gallop.  ?   Comments: Bilateral superficial varicose veins noted. ?Pulmonary:  ?   Effort: Pulmonary effort is normal.  ?   Breath sounds: Normal breath sounds.  ?Abdominal:  ?   General: Bowel sounds are normal.  ?   Palpations: Abdomen is soft.  ?Musculoskeletal:  ?   Right lower leg: No edema.  ?   Left lower leg: No edema.  ? ?Laboratory examination:  ? ?External labs: ? ?Labs 07/19/2020: ? ?A1c 7.5%.  TSH normal at 3.31. ? ?BUN 40, creatinine 2.15, potassium 5.2.  eGFR 22  mL. ? ?Labs 10/23/2019: ? ?A1c 7.4%. ? ?Sodium 139, potassium 4.9, BUN 39, creatinine 1.70, EGFR 27 mL. ? ?Total cholesterol 150, triglycerides 159, HDL 61, LDL 57. ? ?Medications and allergies  ? ?Allergies  ?Allergen Reactions  ? Exenatide Nausea Only  ?  Dizziness  ? Raloxifene Other (See Comments)  ?  hot flashes  ?  ?Current Outpatient Medications:  ?  amiodarone (PACERONE) 200 MG tablet, Take 0.5 tablets (100 mg total) by mouth daily., Disp: 90 tablet, Rfl: 3 ?  atorvastatin (LIPITOR) 20 MG tablet, Take 20 mg by mouth daily., Disp: , Rfl:  ?  carvedilol (COREG) 12.5 MG tablet, TAKE 1 TABLET BY MOUTH TWICE DAILY, Disp: 180 tablet, Rfl: 3 ?  diphenhydramine-acetaminophen (TYLENOL PM) 25-500 MG TABS tablet, Take 2 tablets by mouth at bedtime., Disp: , Rfl:  ?  ezetimibe (ZETIA) 10 MG tablet, Take one-half tablet (5 mg) by mouth daily, Disp: , Rfl:  ?  furosemide (LASIX) 40 MG tablet, TAKE 1 TABLET BY MOUTH DAILY, Disp: 90 tablet, Rfl: 3 ?  glucose blood (ONETOUCH VERIO) test strip, USE TO TEST BLOOD SUGAR TWICE DAILY, Disp: , Rfl:  ?  insulin glargine, 1 Unit Dial, (TOUJEO SOLOSTAR) 300 UNIT/ML Solostar Pen, Inject 15 Units into the skin at bedtime. (Patient taking differently: Inject 30 Units into the skin at bedtime.), Disp: 1.5 pen, Rfl: 0 ?  Insulin Pen Needle (B-D UF III MINI PEN NEEDLES) 31G X 5 MM MISC, Use to inject Toujeo once daily., Disp: , Rfl:  ?  JANUVIA 50 MG tablet, Take 0.5 tablets (25 mg total) by mouth daily. (Patient taking differently: Take 50 mg by mouth daily.), Disp: 15 tablet, Rfl: 0 ?  nateglinide (STARLIX) 120 MG tablet, Take 60 mg by mouth 3 (three) times daily before meals., Disp: , Rfl:  ?  omeprazole (PRILOSEC) 20 MG capsule, Take 1 capsule (20 mg total) by mouth daily as needed (Acid reflux). During severe episodes take 2 capsules until symptoms resolved, Disp: 30 capsule, Rfl: 6  ?  ?Radiology:  ? ?No results found. ? ?Cardiac Studies:  ? ?Coronary Angiogram   [02/04/2014]:  ?Left  and right heart catheterization 02/04/2014: Right atrial pressure 10 mmHg. Right ventricular pressure 32/2 with RVEDP of 15 mmHg. PA pressure 29/15 with mean of 22 mmHg. Pulmonary capillary wedge pressure mean of 6 mmHg. PA saturation 64% and aortic saturation 92%. Fick cardiac output of 5.6 L and index 2.9 L/m. LVEF 55%.  ?Normal coronary arteries. ? ?Echocardiogram 05/17/2020: ?Left ventricle cavity is normal in size. Mild concentric hypertrophy of the left ventricle. Normal global wall motion. Normal LV systolic function  with EF 68%. Doppler evidence of grade II (pseudonormal) diastolic dysfunction, elevated LAP.  ?Left atrial cavity is mildly dilated. ?Mild (Grade I) aortic regurgitation. ?Moderate calcification of the mitral valve annulus. Mildly restricted mitral valve leaflets. Mild (Grade I) mitral regurgitation. ?Mild to moderate tricuspid regurgitation. Estimated pulmonary artery systolic pressure 28 mmHg. ?No significant change compared to previous study on 08/08/2017.  ? ?Device clinic CRTD 09/15/2015:  [facility]   ? ?Remote CRT-D transmission 06/07/2021: ?6%, CRT 90%. There were frequent brief AT/AF episodes longest 46 seconds., AT AF burden <1%. Thoracic impedance is at baseline and does not suggest volume overload state. Longevity 1 year and 11 months. Lead impedance and thresholds normal. ? ?EKG:  ?EKG 06/28/2021: Probably atrially paced rhythm with first-degree AV block, biventricular pacemaker detected.  No further analysis.  Compared to 12/14/2020, previously sinus rhythm.  No change in ventricularly paced rhythm.  ? ?Assessment  ? ?  ICD-10-CM   ?1. Chronic systolic congestive heart failure (HCC)  I50.22 EKG 12-Lead  ?  ?2. Encounter for management of biventricular implantable cardioverter-defibrillator (ICD)  Z45.02   ?  ?3. Paroxysmal atrial fibrillation (HCC)  I48.0   ?  ?4. ICD BV  09/15/2015:  [place] model [phone] (serial  Number [id number]) in situ  Z95.810    ?  ?  ?No orders of the defined types were placed in this encounter. ? ?  ?Medications Discontinued During This Encounter  ?Medication Reason  ? bismuth subsalicylate (PEPTO BISMOL) 262 MG/15ML suspensi

## 2021-12-25 ENCOUNTER — Ambulatory Visit: Payer: Medicare Other | Admitting: Cardiology

## 2021-12-25 ENCOUNTER — Encounter: Payer: Self-pay | Admitting: Cardiology

## 2021-12-25 VITALS — BP 130/78 | HR 81 | Resp 15 | Ht 67.0 in | Wt 191.6 lb

## 2021-12-25 DIAGNOSIS — Z95810 Presence of automatic (implantable) cardiac defibrillator: Secondary | ICD-10-CM

## 2021-12-25 DIAGNOSIS — I480 Paroxysmal atrial fibrillation: Secondary | ICD-10-CM

## 2021-12-25 DIAGNOSIS — I5022 Chronic systolic (congestive) heart failure: Secondary | ICD-10-CM

## 2021-12-25 DIAGNOSIS — E1122 Type 2 diabetes mellitus with diabetic chronic kidney disease: Secondary | ICD-10-CM

## 2021-12-25 MED ORDER — APIXABAN 2.5 MG PO TABS: 5.0000 mg | ORAL_TABLET | Freq: Two times a day (BID) | ORAL | 3 refills | Status: DC

## 2021-12-25 NOTE — Progress Notes (Signed)
Primary Physician/Referring:  Berkley Harvey, NP  Patient ID: Janet Owen, female    DOB: 05/17/1933, 88 y.o.   MRN: [id number]  Chief Complaint  Patient presents with   Congestive Heart Failure   Hypertension   Follow-up    6 months   HPI:    Janet Owen  is a 88 y.o. Caucasian female with nonischemic cardiomyopathy diagnosed in 73 in [place] with severe LV systolic dysfunction, EF 16%, paroxysmal atrial fibrillation, h/o atrial flutter s/p ablation on 07/31/2015, and has been on chronic amiodarone. She has bi-V ICD placed on 11/09/2015, by Dr. Crissie Sickles.  Past medical history significant for hypertension, hyperlipidemia, type 2 diabetes mellitus and stage IV chronic kidney disease.  This is her 6 month visit. She does endorse fatigue and low energy over the past several months. She has mild chronic dyspnea but mostly at rest given her physical activity has been limited due to back pain and arthritis. She is currently living at [facility] and does walk daily to meals and once weekly for hair appointment. She denies significant dyspnea with exertion. She denies chest pain, palpitations, syncope.  Past Medical History:  Diagnosis Date   Atrial fibrillation ([place])    DM (diabetes mellitus) ([place])    Heart failure ([place])    Hyperlipidemia    Hypertension    Past Surgical History:  Procedure Laterality Date   A-FLUTTER ABLATION N/A 07/30/2016   Procedure: A-Flutter Ablation;  Surgeon: Evans Lance, MD;  Location: [facility];  Service: Cardiovascular;  Laterality: N/A;   APPENDECTOMY     BACK SURGERY     EP IMPLANTABLE DEVICE N/A 11/11/2015   Procedure: BiVi Upgrade;  Surgeon: Evans Lance, MD;  Location: [facility];  Service: Cardiovascular;  Laterality: N/A;   EP IMPLANTABLE DEVICE N/A 09/15/2015   Procedure: BiV ICD Insertion CRT-D;  Surgeon: Will Meredith Leeds, MD;  Location: [facility];  Service: Cardiovascular;  Laterality:  N/A;   PARTIAL HYSTERECTOMY     REPLACEMENT TOTAL KNEE     TOTAL HIP ARTHROPLASTY Right 08/25/2019   Procedure: TOTAL HIP ARTHROPLASTY ANTERIOR APPROACH;  Surgeon: Paralee Cancel, MD;  Location: [facility];  Service: Orthopedics;  Laterality: Right;   VESICOVAGINAL FISTULA CLOSURE W/ TAH     Family History  Problem Relation Age of Onset   Asthma Mother    Pancreatitis Mother    Alcohol abuse Father     Social History   Tobacco Use   Smoking status: Never   Smokeless tobacco: Never  Substance Use Topics   Alcohol use: No    Alcohol/week: 0.0 standard drinks of alcohol   ROS  Review of Systems  Constitutional: Positive for malaise/fatigue.  Cardiovascular:  Positive for dyspnea on exertion (stable) and leg swelling (chronic, stable). Negative for chest pain, near-syncope and palpitations.  Musculoskeletal:  Positive for back pain and joint pain.  Neurological:  Negative for dizziness and light-headedness.   Objective  Blood pressure 130/78, pulse 81, resp. rate 15, height _0  (1.702 m), weight 191 lb 9.6 oz (86.9 kg), SpO2 96 %.     12/25/2021   10:28 AM 06/28/2021   10:58 AM 12/14/2020   10:41 AM  Vitals with BMI  Height _1  _2  _3   Weight 191 lbs 10 oz 184 lbs 6 oz 186 lbs 13 oz  BMI 30 01.09 32.35  Systolic 573 220 254  Diastolic 78 81 89  Pulse 81  74 87     Physical Exam Neck:     Vascular: No carotid bruit or JVD.  Cardiovascular:     Rate and Rhythm: Normal rate and regular rhythm.     Pulses: Intact distal pulses.          Dorsalis pedis pulses are 1+ on the right side and 1+ on the left side.     Heart sounds: Normal heart sounds. No murmur heard.    No gallop.  Pulmonary:     Effort: Pulmonary effort is normal.     Breath sounds: Normal breath sounds.  Abdominal:     General: Bowel sounds are normal.     Palpations: Abdomen is soft.  Musculoskeletal:     Right lower leg: Edema present.     Left lower leg: Edema present.     Comments: Bilateral  superficial varicose veins noted.    Laboratory examination:   External labs:  07/19/2020:  A1c 7.5%.  TSH normal at 3.31.  BUN 40, creatinine 2.15, potassium 5.2.  eGFR 22 mL.  10/23/2019:  A1c 7.4%.  Sodium 139, potassium 4.9, BUN 39, creatinine 1.70, EGFR 27 mL.  Total cholesterol 150, triglycerides 159, HDL 61, LDL 57.  Medications and allergies   Allergies  Allergen Reactions   Exenatide Nausea Only    Dizziness   Raloxifene Other (See Comments)    hot flashes    Current Outpatient Medications:    amiodarone (PACERONE) 200 MG tablet, Take 0.5 tablets (100 mg total) by mouth daily., Disp: 90 tablet, Rfl: 3   apixaban (ELIQUIS) 2.5 MG TABS tablet, Take 2 tablets (5 mg total) by mouth 2 (two) times daily., Disp: 60 tablet, Rfl: 3   atorvastatin (LIPITOR) 20 MG tablet, Take 20 mg by mouth daily., Disp: , Rfl:    carvedilol (COREG) 12.5 MG tablet, Take 1 tablet (12.5 mg total) by mouth 2 (two) times daily., Disp: 180 tablet, Rfl: 3   diphenhydramine-acetaminophen (TYLENOL PM) 25-500 MG TABS tablet, Take 2 tablets by mouth at bedtime., Disp: , Rfl:    doxylamine, Sleep, (UNISOM) 25 MG tablet, Take 25 mg by mouth at bedtime as needed., Disp: , Rfl:    ezetimibe (ZETIA) 10 MG tablet, Take one-half tablet (5 mg) by mouth daily, Disp: , Rfl:    fish oil-omega-3 fatty acids 1000 MG capsule, Take 2 g by mouth daily., Disp: , Rfl:    furosemide (LASIX) 40 MG tablet, TAKE 1 TABLET BY MOUTH DAILY, Disp: 90 tablet, Rfl: 3   glucose blood (ONETOUCH VERIO) test strip, USE TO TEST BLOOD SUGAR TWICE DAILY, Disp: , Rfl:    Insulin Pen Needle (B-D UF III MINI PEN NEEDLES) 31G X 5 MM MISC, Use to inject Toujeo once daily., Disp: , Rfl:    Multiple Vitamins-Minerals (CENTRUM SILVER 50+WOMEN PO), Take by mouth., Disp: , Rfl:    nateglinide (STARLIX) 120 MG tablet, Take 60 mg by mouth 3 (three) times daily before meals., Disp: , Rfl:    omeprazole (PRILOSEC) 20 MG capsule, Take 1 capsule (20 mg  total) by mouth daily as needed (Acid reflux). During severe episodes take 2 capsules until symptoms resolved, Disp: 30 capsule, Rfl: 6   sitaGLIPtin (JANUVIA) 25 MG tablet, Take 25 mg by mouth daily., Disp: , Rfl:    insulin glargine, 1 Unit Dial, (TOUJEO SOLOSTAR) 300 UNIT/ML Solostar Pen, Inject 15 Units into the skin at bedtime. (Patient taking differently: Inject 30 Units into the skin at bedtime.), Disp: 1.5 pen, Rfl:  0    Radiology:   No results found.  Cardiac Studies:   Coronary Angiogram   [12-Feb-2014]:  Left and right heart catheterization 02-12-14: Right atrial pressure 10 mmHg. Right ventricular pressure 32/2 with RVEDP of 15 mmHg. PA pressure 29/15 with mean of 22 mmHg. Pulmonary capillary wedge pressure mean of 6 mmHg. PA saturation 64% and aortic saturation 92%. Fick cardiac output of 5.6 L and index 2.9 L/m. LVEF 55%.  Normal coronary arteries.  Echocardiogram 05/17/2020: Left ventricle cavity is normal in size. Mild concentric hypertrophy of the left ventricle. Normal global wall motion. Normal LV systolic function with EF 68%. Doppler evidence of grade II (pseudonormal) diastolic dysfunction, elevated LAP.  Left atrial cavity is mildly dilated. Mild (Grade I) aortic regurgitation. Moderate calcification of the mitral valve annulus. Mildly restricted mitral valve leaflets. Mild (Grade I) mitral regurgitation. Mild to moderate tricuspid regurgitation. Estimated pulmonary artery systolic pressure 28 mmHg. No significant change compared to previous study on 08/08/2017.   Device clinic CRTD 09/15/2015:  [place]    Remote CRT-D transmission 12/10/2021:  AP 83%, CRT 99%.  Longevity 1 year and 10 months.  Lead impedance and thresholds are normal.  There are frequent mode switches, EGM = brief  A-fib, with reverse mode switch, AT/AF burden <1%.  Thoracic impedance is at baseline with no suggestion of heart failure.  Normal CRT-D function.   EKG:  EKG  06/28/2021: Probably atrially paced rhythm with first-degree AV block, biventricular pacemaker detected.  No further analysis.  Compared to 12/14/2020, previously sinus rhythm.  No change in ventricularly paced rhythm.   Assessment     ICD-10-CM   1. Chronic systolic congestive heart failure (HCC)  I50.22     2. Paroxysmal atrial fibrillation (HCC)  I48.0     3. ICD BV  09/15/2015:  [employer] [place] model [phone] (serial  Number [id number]) in situ  Z95.810     4. Type 2 diabetes mellitus with stage 4 chronic kidney disease, with long-term current use of insulin (HCC)  E11.22    N18.4    Z79.4        Meds ordered this encounter  Medications   apixaban (ELIQUIS) 2.5 MG TABS tablet    Sig: Take 2 tablets (5 mg total) by mouth 2 (two) times daily.    Dispense:  60 tablet    Refill:  3     Medications Discontinued During This Encounter  Medication Reason   JANUVIA 50 MG tablet    \ Recommendations:   SERINE KEA  is a 88 y.o. she will be 89 in 10 days, Caucasian female with nonischemic cardiomyopathy diagnosed in 2015 in [place] with severe LV systolic dysfunction, EF 10%, paroxysmal atrial fibrillation, h/o atrial flutter s/p ablation on 07/31/2015, and has been on chronic amiodarone therapy, she has bi-V ICD placed on 11/09/2015, by Dr. Crissie Sickles.  Past medical history significant for hypertension, hyperlipidemia, type 2 diabetes mellitus and stage IV chronic kidney disease.   Chronic systolic congestive heart failure (HCC) She does have mild dyspnea that is not worsened with activity and chronic lower extremity edema that has not worsened.  No other clinical evidence of heart failure. She is not on ACE or ARB given chronic kidney failure.  Paroxysmal atrial fibrillation (HCC) ICD check, suspect patient in persistent AF since > 4-6 months. Undersensing A and false sinus with underlying AF. Remains on Amiodarone 133m daily. She does endorse fatigue for the  past  several months. Will start Eliquis 2.1m twice daily and schedule for cardioversion in 3 weeks.  ICD BV  09/15/2015:  [employer] [phone](serial  Number [id number] in situ Scheduled  In office pacemaker check 12/25/21  Single (S)/Dual (D)/BV (M) B Presenting ASBP Pacer dependant: No, Underlying V escape 44.  AP 83%, BP 99% AMS Episodes Yes.  AT/AF burden <1%.   HVR 0.   Longevity 1.8 Years/Voltage.  Lead measurements: Stable Histogram: Low (L)/normal (N)/high (H)  Normal Patient activity Good. Thoracic impedance: Baseline.   Observations: [place] BV ICD function.  Changes: Decreased sensitivity for A Capture from auto to 2 V to capture AF.  Suspect patient in persistent AF since > 4-6 months. Undersensing A and false sinus with underlying AF.  Type 2 diabetes mellitus with stage 4 chronic kidney disease, with long-term current use of insulin ([place] reviewed her external labs, her renal function stable over the past 6 months at stage IV.  This is closely being followed by nephrology.  Diabetes continues to be still uncontrolled.  Follow-up in 6 weeks after cardioversion or sooner if needed.   BErnst Spell AGNP-C 12/25/2021, 1:04 PM Office: [phone]: [phone]

## 2022-01-10 ENCOUNTER — Telehealth: Payer: Self-pay

## 2022-01-10 NOTE — Telephone Encounter (Signed)
Yes, please postpone cardioversion to at least 10 days from today

## 2022-01-10 NOTE — Telephone Encounter (Signed)
Spoke with patient and we got her cardioversion pushed out 10 days from today.

## 2022-01-10 NOTE — Telephone Encounter (Signed)
Can you call this patient please?

## 2022-01-10 NOTE — Telephone Encounter (Signed)
Patient called because she was confused on how to take the Eliquis 2.5mg . She has been only taking 2.5mg   once a day since Monday November the 13th. She did not realize she was supposed to be taking 2.5mg  2 tabs ( 5mg  total) twice a day.  When she picked up her prescription, she read the instructions on the bottle and now she is taking the correct dose. 2.5mg  2 tabs BID, since yesterday. She wants to know if this is going to affect her cardioversion on Dec 5th.

## 2022-01-24 ENCOUNTER — Other Ambulatory Visit: Payer: Self-pay

## 2022-01-26 ENCOUNTER — Ambulatory Visit (HOSPITAL_COMMUNITY): Payer: Medicare Other | Admitting: Certified Registered Nurse Anesthetist

## 2022-01-26 ENCOUNTER — Ambulatory Visit (HOSPITAL_BASED_OUTPATIENT_CLINIC_OR_DEPARTMENT_OTHER): Payer: Medicare Other | Admitting: Certified Registered Nurse Anesthetist

## 2022-01-26 ENCOUNTER — Ambulatory Visit (HOSPITAL_COMMUNITY)
Admission: RE | Admit: 2022-01-26 | Discharge: 2022-01-26 | Disposition: A | Discharge status: Home / Self Care | Payer: Medicare Other | Source: Ambulatory Visit | Attending: Cardiology | Admitting: Cardiology

## 2022-01-26 ENCOUNTER — Encounter (HOSPITAL_COMMUNITY)
Admission: RE | Disposition: A | Discharge status: Home / Self Care | Payer: Self-pay | Source: Ambulatory Visit | Attending: Cardiology

## 2022-01-26 ENCOUNTER — Encounter (HOSPITAL_COMMUNITY): Payer: Self-pay | Admitting: Cardiology

## 2022-01-26 ENCOUNTER — Other Ambulatory Visit: Payer: Self-pay

## 2022-01-26 DIAGNOSIS — E119 Type 2 diabetes mellitus without complications: Secondary | ICD-10-CM

## 2022-01-26 DIAGNOSIS — N184 Chronic kidney disease, stage 4 (severe): Secondary | ICD-10-CM | POA: Diagnosis not present

## 2022-01-26 DIAGNOSIS — I4819 Other persistent atrial fibrillation: Secondary | ICD-10-CM | POA: Diagnosis present

## 2022-01-26 DIAGNOSIS — I4891 Unspecified atrial fibrillation: Secondary | ICD-10-CM

## 2022-01-26 DIAGNOSIS — I5022 Chronic systolic (congestive) heart failure: Secondary | ICD-10-CM | POA: Insufficient documentation

## 2022-01-26 DIAGNOSIS — I10 Essential (primary) hypertension: Secondary | ICD-10-CM

## 2022-01-26 DIAGNOSIS — E1122 Type 2 diabetes mellitus with diabetic chronic kidney disease: Secondary | ICD-10-CM | POA: Insufficient documentation

## 2022-01-26 DIAGNOSIS — Z79899 Other long term (current) drug therapy: Secondary | ICD-10-CM | POA: Insufficient documentation

## 2022-01-26 DIAGNOSIS — I130 Hypertensive heart and chronic kidney disease with heart failure and stage 1 through stage 4 chronic kidney disease, or unspecified chronic kidney disease: Secondary | ICD-10-CM | POA: Diagnosis not present

## 2022-01-26 DIAGNOSIS — I428 Other cardiomyopathies: Secondary | ICD-10-CM | POA: Insufficient documentation

## 2022-01-26 DIAGNOSIS — Z95810 Presence of automatic (implantable) cardiac defibrillator: Secondary | ICD-10-CM | POA: Diagnosis not present

## 2022-01-26 DIAGNOSIS — E785 Hyperlipidemia, unspecified: Secondary | ICD-10-CM | POA: Insufficient documentation

## 2022-01-26 HISTORY — PX: CARDIOVERSION: SHX1299

## 2022-01-26 LAB — POCT I-STAT, CHEM 8
BUN: 37 mg/dL — ABNORMAL HIGH (ref 8–23)
Calcium, Ion: 1.26 mmol/L (ref 1.15–1.40)
Chloride: 101 mmol/L (ref 98–111)
Creatinine, Ser: 2.1 mg/dL — ABNORMAL HIGH (ref 0.44–1.00)
Glucose, Bld: 101 mg/dL — ABNORMAL HIGH (ref 70–99)
HCT: 44 % (ref 36.0–46.0)
Hemoglobin: 15 g/dL (ref 12.0–15.0)
Potassium: 4.3 mmol/L (ref 3.5–5.1)
Sodium: 142 mmol/L (ref 135–145)
TCO2: 32 mmol/L (ref 22–32)

## 2022-01-26 SURGERY — CARDIOVERSION
Anesthesia: General

## 2022-01-26 MED ORDER — PROPOFOL 10 MG/ML IV BOLUS
INTRAVENOUS | Status: DC | PRN
  Administered 2022-01-26: 60 mg via INTRAVENOUS

## 2022-01-26 MED ORDER — LIDOCAINE 2% (20 MG/ML) 5 ML SYRINGE
INTRAMUSCULAR | Status: DC | PRN
  Administered 2022-01-26: 60 mg via INTRAVENOUS

## 2022-01-26 MED ORDER — SODIUM CHLORIDE 0.9 % IV SOLN: INTRAVENOUS | Status: DC

## 2022-01-26 NOTE — H&P (Signed)
Primary Physician/Referring:  Berkley Harvey, NP  Patient ID: Janet Owen, female    DOB: 11-01-1933, 88 y.o.   MRN: [id number]  Dyspnea.Atrial fib  HPI:    Janet Owen  is a 88 y.o. Caucasian female with nonischemic cardiomyopathy diagnosed in 89 in [place] with severe LV systolic dysfunction, EF 29%, paroxysmal atrial fibrillation, h/o atrial flutter s/p ablation on 07/31/2015, and has been on chronic amiodarone. She has bi-V ICD placed on 11/09/2015, by Dr. Crissie Sickles.  Past medical history significant for hypertension, hyperlipidemia, type 2 diabetes mellitus and stage IV chronic kidney disease.  This is her 6 month visit. She does endorse fatigue and low energy over the past several months. She has mild chronic dyspnea but mostly at rest given her physical activity has been limited due to back pain and arthritis. She is currently living at [facility] and does walk daily to meals and once weekly for hair appointment. She denies significant dyspnea with exertion. She denies chest pain, palpitations, syncope.  Past Medical History:  Diagnosis Date   Atrial fibrillation ([place])    DM (diabetes mellitus) ([place])    Heart failure ([place])    Hyperlipidemia    Hypertension    Past Surgical History:  Procedure Laterality Date   A-FLUTTER ABLATION N/A 07/30/2016   Procedure: A-Flutter Ablation;  Surgeon: Evans Lance, MD;  Location: [facility];  Service: Cardiovascular;  Laterality: N/A;   APPENDECTOMY     BACK SURGERY     EP IMPLANTABLE DEVICE N/A 11/11/2015   Procedure: BiVi Upgrade;  Surgeon: Evans Lance, MD;  Location: [facility];  Service: Cardiovascular;  Laterality: N/A;   EP IMPLANTABLE DEVICE N/A 09/15/2015   Procedure: BiV ICD Insertion CRT-D;  Surgeon: Will Meredith Leeds, MD;  Location: [facility];  Service: Cardiovascular;  Laterality: N/A;   PARTIAL HYSTERECTOMY     REPLACEMENT TOTAL KNEE     TOTAL HIP ARTHROPLASTY Right  08/25/2019   Procedure: TOTAL HIP ARTHROPLASTY ANTERIOR APPROACH;  Surgeon: Paralee Cancel, MD;  Location: [facility];  Service: Orthopedics;  Laterality: Right;   VESICOVAGINAL FISTULA CLOSURE W/ TAH     Family History  Problem Relation Age of Onset   Asthma Mother    Pancreatitis Mother    Alcohol abuse Father     Social History   Tobacco Use   Smoking status: Never   Smokeless tobacco: Never  Substance Use Topics   Alcohol use: No    Alcohol/week: 0.0 standard drinks of alcohol   ROS  Review of Systems  Constitutional: Positive for malaise/fatigue.  Cardiovascular:  Positive for dyspnea on exertion (stable) and leg swelling (chronic, stable). Negative for chest pain, near-syncope and palpitations.  Musculoskeletal:  Positive for back pain and joint pain.  Neurological:  Negative for dizziness and light-headedness.   Objective  Blood pressure 131/88, pulse 76, temperature (!) 97.1 F (36.2 C), temperature source Temporal, resp. rate 16, height _0  (1.676 m), weight 89.8 kg, SpO2 96 %.     01/26/2022    8:07 AM 12/25/2021   10:28 AM 06/28/2021   10:58 AM  Vitals with BMI  Height _1  _2  _3   Weight 198 lbs 191 lbs 10 oz 184 lbs 6 oz  BMI 02.11 30 15.52  Systolic 080 223 361  Diastolic 88 78 81  Pulse 76 81 74     Physical Exam Neck:     Vascular: No carotid  bruit or JVD.  Cardiovascular:     Rate and Rhythm: Normal rate and regular rhythm.     Pulses: Intact distal pulses.          Dorsalis pedis pulses are 1+ on the right side and 1+ on the left side.     Heart sounds: Normal heart sounds. No murmur heard.    No gallop.  Pulmonary:     Effort: Pulmonary effort is normal.     Breath sounds: Normal breath sounds.  Abdominal:     General: Bowel sounds are normal.     Palpations: Abdomen is soft.  Musculoskeletal:     Right lower leg: Edema present.     Left lower leg: Edema present.     Comments: Bilateral superficial varicose veins noted.    Laboratory  examination:   External labs:  07/19/2020:  A1c 7.5%.  TSH normal at 3.31.  BUN 40, creatinine 2.15, potassium 5.2.  eGFR 22 mL.  10/23/2019:  A1c 7.4%.  Sodium 139, potassium 4.9, BUN 39, creatinine 1.70, EGFR 27 mL.  Total cholesterol 150, triglycerides 159, HDL 61, LDL 57.  Medications and allergies   Allergies  Allergen Reactions   Byetta 10 Mcg Pen [Exenatide] Nausea Only    Dizziness   Evista [Raloxifene] Other (See Comments)    hot flashes    Current Facility-Administered Medications:    0.9 %  sodium chloride infusion, , Intravenous, Continuous, Adrian Prows, MD    Radiology:   No results found.  Cardiac Studies:   Coronary Angiogram   [Mar 01, 2014]:  Left and right heart catheterization 03-01-2014: Right atrial pressure 10 mmHg. Right ventricular pressure 32/2 with RVEDP of 15 mmHg. PA pressure 29/15 with mean of 22 mmHg. Pulmonary capillary wedge pressure mean of 6 mmHg. PA saturation 64% and aortic saturation 92%. Fick cardiac output of 5.6 L and index 2.9 L/m. LVEF 55%.  Normal coronary arteries.  Echocardiogram 05/17/2020: Left ventricle cavity is normal in size. Mild concentric hypertrophy of the left ventricle. Normal global wall motion. Normal LV systolic function with EF 68%. Doppler evidence of grade II (pseudonormal) diastolic dysfunction, elevated LAP.  Left atrial cavity is mildly dilated. Mild (Grade I) aortic regurgitation. Moderate calcification of the mitral valve annulus. Mildly restricted mitral valve leaflets. Mild (Grade I) mitral regurgitation. Mild to moderate tricuspid regurgitation. Estimated pulmonary artery systolic pressure 28 mmHg. No significant change compared to previous study on 08/08/2017.   Device clinic CRTD 09/15/2015:  [place]    Remote CRT-D transmission 12/10/2021:  AP 83%, CRT 99%.  Longevity 1 year and 10 months.  Lead impedance and thresholds are normal.  There are frequent mode switches, EGM = brief   A-fib, with reverse mode switch, AT/AF burden <1%.  Thoracic impedance is at baseline with no suggestion of heart failure.  Normal CRT-D function.   EKG:  EKG 06/28/2021: Probably atrially paced rhythm with first-degree AV block, biventricular pacemaker detected.  No further analysis.  Compared to 12/14/2020, previously sinus rhythm.  No change in ventricularly paced rhythm.   Assessment     ICD-10-CM   1. Persistent atrial fibrillation (HCC)  I48.19 EKG 12-Lead pre-cardioversion    EKG 12-Lead    EKG 12-Lead pre-cardioversion    EKG 12-Lead     Recommendations:   HONORE WIPPERFURTH  is a 88 y.o. she will be 46 in 10 days, Caucasian female with nonischemic cardiomyopathy diagnosed in 6 in [place] with severe LV systolic dysfunction, EF 82%, paroxysmal  atrial fibrillation, h/o atrial flutter s/p ablation on 07/31/2015, and has been on chronic amiodarone therapy, she has bi-V ICD placed on 11/09/2015, by Dr. Crissie Sickles.  Past medical history significant for hypertension, hyperlipidemia, type 2 diabetes mellitus and stage IV chronic kidney disease.   Chronic systolic congestive heart failure (HCC) She does have mild dyspnea that is not worsened with activity and chronic lower extremity edema that has not worsened.  No other clinical evidence of heart failure. She is not on ACE or ARB given chronic kidney failure.  Paroxysmal atrial fibrillation (HCC) ICD check, suspect patient in persistent AF since > 4-6 months. Undersensing A and false sinus with underlying AF. Remains on Amiodarone 158m daily. She does endorse fatigue for the past several months. Will start Eliquis 2.537mtwice daily and schedule for cardioversion in 3 weeks.   Schedule for Direct current cardioversion. I have discussed regarding risks benefits rate control vs rhythm control with the patient. Patient understands cardiac arrest and need for CPR, aspiration pneumonia, but not limited to these. Patient is willing.     JaAdrian ProwsAGNP-C 01/26/2022, 10:32 AM Office: [phone]: [phone]

## 2022-01-26 NOTE — Anesthesia Procedure Notes (Signed)
Procedure Name: General with mask airway Date/Time: 01/26/2022 10:31 AM  Performed by: Janace Litten, CRNAPre-anesthesia Checklist: Patient identified, Emergency Drugs available, Suction available, Timeout performed and Patient being monitored Patient Re-evaluated:Patient Re-evaluated prior to induction Oxygen Delivery Method: Ambu bag Preoxygenation: Pre-oxygenation with 100% oxygen Induction Type: IV induction

## 2022-01-26 NOTE — Anesthesia Preprocedure Evaluation (Signed)
Anesthesia Evaluation  Patient identified by MRN, date of birth, ID band Patient awake    Reviewed: Allergy & Precautions, H&P , NPO status , Patient's Chart, lab work & pertinent test results  Airway Mallampati: II   Neck ROM: full    Dental   Pulmonary shortness of breath   breath sounds clear to auscultation       Cardiovascular hypertension, + dysrhythmias Atrial Fibrillation  Rhythm:irregular Rate:Normal     Neuro/Psych    GI/Hepatic   Endo/Other  diabetes, Type 2    Renal/GU      Musculoskeletal   Abdominal   Peds  Hematology   Anesthesia Other Findings   Reproductive/Obstetrics                             Anesthesia Physical Anesthesia Plan  ASA: 3  Anesthesia Plan: General   Post-op Pain Management:    Induction: Intravenous  PONV Risk Score and Plan: 3 and Propofol infusion and Treatment may vary due to age or medical condition  Airway Management Planned: Mask  Additional Equipment:   Intra-op Plan:   Post-operative Plan:   Informed Consent: I have reviewed the patients History and Physical, chart, labs and discussed the procedure including the risks, benefits and alternatives for the proposed anesthesia with the patient or authorized representative who has indicated his/her understanding and acceptance.     Dental advisory given  Plan Discussed with: CRNA, Anesthesiologist and Surgeon  Anesthesia Plan Comments:        Anesthesia Quick Evaluation

## 2022-01-26 NOTE — Discharge Instructions (Signed)

## 2022-01-26 NOTE — Transfer of Care (Signed)
Immediate Anesthesia Transfer of Care Note  Patient: Janet Owen  Procedure(s) Performed: CARDIOVERSION  Patient Location: PACU and Endoscopy Unit  Anesthesia Type:General  Level of Consciousness: drowsy and responds to stimulation  Airway & Oxygen Therapy: Patient Spontanous Breathing  Post-op Assessment: Report given to RN and Post -op Vital signs reviewed and stable  Post vital signs: Reviewed and stable  Last Vitals:  Vitals Value Taken Time  BP    Temp    Pulse    Resp    SpO2      Last Pain:  Vitals:   01/26/22 0807  TempSrc: Temporal         Complications: No notable events documented.

## 2022-01-26 NOTE — CV Procedure (Signed)
Direct current cardioversion 01/26/2022 10:43 AM  Indication symptomatic A. Fibrillation.  Procedure: Using 60 mg of IV Propofol and 60 IV Lidocaine (for reducing venous pain) for achieving deep sedation, synchronized direct current cardioversion performed. Patient was delivered with 200 Joules of electricity X 1 with success to NSR. Patient tolerated the procedure well. No immediate complication noted.     Pre-cardioversion     Post cardioversion ICD interrogation    Allergies as of 01/26/2022       Reactions   Byetta 10 Mcg Pen [exenatide] Nausea Only   Dizziness   Evista [raloxifene] Other (See Comments)   hot flashes        Medication List     TAKE these medications    amiodarone 200 MG tablet Commonly known as: PACERONE Take 0.5 tablets (100 mg total) by mouth daily.   apixaban 2.5 MG Tabs tablet Commonly known as: ELIQUIS Take 2 tablets (5 mg total) by mouth 2 (two) times daily.   atorvastatin 20 MG tablet Commonly known as: LIPITOR Take 20 mg by mouth in the morning.   B-D UF III MINI PEN NEEDLES 31G X 5 MM Misc Generic drug: Insulin Pen Needle Use to inject Toujeo once daily.   carvedilol 12.5 MG tablet Commonly known as: COREG Take 1 tablet (12.5 mg total) by mouth 2 (two) times daily.   CENTRUM SILVER 50+WOMEN PO Take 1 tablet by mouth daily.   diphenhydramine-acetaminophen 25-500 MG Tabs tablet Commonly known as: TYLENOL PM Take 2 tablets by mouth at bedtime.   doxylamine (Sleep) 25 MG tablet Commonly known as: UNISOM Take 25 mg by mouth at bedtime as needed for sleep.   ezetimibe 10 MG tablet Commonly known as: ZETIA Take 5 mg by mouth in the morning. Take one-half tablet (5 mg) by mouth daily   fish oil-omega-3 fatty acids 1000 MG capsule Take 2 g by mouth daily.   furosemide 40 MG tablet Commonly known as: LASIX TAKE 1 TABLET BY MOUTH DAILY   nateglinide 60 MG tablet Commonly known as: STARLIX Take 60 mg by mouth 3 (three) times  daily.   omeprazole 20 MG capsule Commonly known as: PRILOSEC Take 1 capsule (20 mg total) by mouth daily as needed (Acid reflux). During severe episodes take 2 capsules until symptoms resolved   OneTouch Verio test strip Generic drug: glucose blood USE TO TEST BLOOD SUGAR TWICE DAILY   sitaGLIPtin 25 MG tablet Commonly known as: JANUVIA Take 25 mg by mouth in the morning.   Toujeo SoloStar 300 UNIT/ML Solostar Pen Generic drug: insulin glargine (1 Unit Dial) Inject 15 Units into the skin at bedtime. What changed: how much to take          Adrian Prows, MD, [facility] 01/26/2022, 10:43 AM Office: [phone] Fax: [phone] Pager: [phone]

## 2022-01-26 NOTE — Interval H&P Note (Signed)
History and Physical Interval Note:  01/26/2022 10:33 AM  Janet Owen  has presented today for surgery, with the diagnosis of AFIB.  The various methods of treatment have been discussed with the patient and family. After consideration of risks, benefits and other options for treatment, the patient has consented to  Procedure(s): CARDIOVERSION (N/A) as a surgical intervention.  The patient's history has been reviewed, patient examined, no change in status, stable for surgery.  I have reviewed the patient's chart and labs.  Questions were answered to the patient's satisfaction.     Adrian Prows

## 2022-01-29 ENCOUNTER — Encounter (HOSPITAL_COMMUNITY): Payer: Self-pay | Admitting: Cardiology

## 2022-01-29 NOTE — Anesthesia Postprocedure Evaluation (Signed)
Anesthesia Post Note  Patient: Janet Owen  Procedure(s) Performed: CARDIOVERSION     Patient location during evaluation: Endoscopy Anesthesia Type: General Level of consciousness: awake and alert Pain management: pain level controlled Vital Signs Assessment: post-procedure vital signs reviewed and stable Respiratory status: spontaneous breathing, nonlabored ventilation, respiratory function stable and patient connected to nasal cannula oxygen Cardiovascular status: blood pressure returned to baseline and stable Postop Assessment: no apparent nausea or vomiting Anesthetic complications: no   No notable events documented.  Last Vitals:  Vitals:   01/26/22 1100 01/26/22 1108  BP: (!) 143/81 (!) 152/71  Pulse: 60 60  Resp: 17 17  Temp:    SpO2: 95% 95%    Last Pain:  Vitals:   01/26/22 1108  TempSrc:   PainSc: (P) 0-No pain                 Wandell Scullion S

## 2022-02-07 NOTE — Progress Notes (Unsigned)
Primary Physician/Referring:  Berkley Harvey, NP  Patient ID: Janet Owen, female    DOB: [birth year], 88 y.o.   MRN: [id number]  No chief complaint on file.  HPI:    Janet Owen  is a 88 y.o. Caucasian female with nonischemic cardiomyopathy since 2015 with moderate to severe LV systolic dysfunction, EF 22%, paroxysmal atrial fibrillation, h/o atrial flutter s/p ablation on 07/31/2015, and has been on chronic amiodarone therapy, she has bi-V ICD placed on 11/09/2015, by Dr. Crissie Sickles.  Past medical history significant for hypertension, hyperlipidemia, type 2 diabetes mellitus and stage IV chronic kidney disease.    Due to persistent atrial fibrillation, underwent direct-current cardioversion on 01/26/2022 and amiodarone dose was increased to 200 mg daily, now presents for follow-up. ***   Past Medical History:  Diagnosis Date   Atrial fibrillation ([place])    DM (diabetes mellitus) ([place])    Heart failure ([place])    Hyperlipidemia    Hypertension    Past Surgical History:  Procedure Laterality Date   A-FLUTTER ABLATION N/A 07/30/2016   Procedure: A-Flutter Ablation;  Surgeon: Evans Lance, MD;  Location: [facility];  Service: Cardiovascular;  Laterality: N/A;   APPENDECTOMY     BACK SURGERY     CARDIOVERSION N/A 01/26/2022   Procedure: CARDIOVERSION;  Surgeon: Adrian Prows, MD;  Location: [facility];  Service: Cardiovascular;  Laterality: N/A;   EP IMPLANTABLE DEVICE N/A 11/11/2015   Procedure: BiVi Upgrade;  Surgeon: Evans Lance, MD;  Location: [facility];  Service: Cardiovascular;  Laterality: N/A;   EP IMPLANTABLE DEVICE N/A 09/15/2015   Procedure: BiV ICD Insertion CRT-D;  Surgeon: Will Meredith Leeds, MD;  Location: [facility];  Service: Cardiovascular;  Laterality: N/A;   PARTIAL HYSTERECTOMY     REPLACEMENT TOTAL KNEE     TOTAL HIP ARTHROPLASTY Right 08/25/2019   Procedure: TOTAL HIP ARTHROPLASTY ANTERIOR APPROACH;  Surgeon: Paralee Cancel, MD;  Location: [facility];  Service: Orthopedics;  Laterality: Right;   VESICOVAGINAL FISTULA CLOSURE W/ TAH     Family History  Problem Relation Age of Onset   Asthma Mother    Pancreatitis Mother    Alcohol abuse Father     Social History   Tobacco Use   Smoking status: Never   Smokeless tobacco: Never  Substance Use Topics   Alcohol use: No    Alcohol/week: 0.0 standard drinks of alcohol   ROS  Review of Systems  Constitutional: Positive for malaise/fatigue.  Cardiovascular:  Positive for dyspnea on exertion (stable) and leg swelling (chronic, stable). Negative for chest pain, near-syncope and palpitations.  Musculoskeletal:  Positive for back pain and joint pain.  Neurological:  Negative for dizziness and light-headedness.   Objective  There were no vitals taken for this visit.     01/26/2022   11:08 AM 01/26/2022   11:00 AM 01/26/2022   10:50 AM  Vitals with BMI  Systolic 449 753 005  Diastolic 71 81 86  Pulse 60 60 60     Physical Exam Neck:     Vascular: No carotid bruit or JVD.  Cardiovascular:     Rate and Rhythm: Normal rate and regular rhythm.     Pulses: Intact distal pulses.          Dorsalis pedis pulses are 1+ on the right side and 1+ on the left side.     Heart sounds: Normal heart sounds. No murmur heard.    No  gallop.  Pulmonary:     Effort: Pulmonary effort is normal.     Breath sounds: Normal breath sounds.  Abdominal:     General: Bowel sounds are normal.     Palpations: Abdomen is soft.  Musculoskeletal:     Right lower leg: Edema present.     Left lower leg: Edema present.     Comments: Bilateral superficial varicose veins noted.    Laboratory examination:   External labs:  07/19/2020:  A1c 7.5%.  TSH normal at 3.31.  BUN 40, creatinine 2.15, potassium 5.2.  eGFR 22 mL.  10/23/2019:  A1c 7.4%.  Sodium 139, potassium 4.9, BUN 39, creatinine 1.70, EGFR 27 mL.  Total cholesterol 150, triglycerides 159, HDL 61, LDL  57.  Medications and allergies   Allergies  Allergen Reactions   Byetta 10 Mcg Pen [Exenatide] Nausea Only    Dizziness   Evista [Raloxifene] Other (See Comments)    hot flashes    Current Outpatient Medications:    amiodarone (PACERONE) 200 MG tablet, Take 0.5 tablets (100 mg total) by mouth daily., Disp: 90 tablet, Rfl: 3   apixaban (ELIQUIS) 2.5 MG TABS tablet, Take 2 tablets (5 mg total) by mouth 2 (two) times daily., Disp: 60 tablet, Rfl: 3   atorvastatin (LIPITOR) 20 MG tablet, Take 20 mg by mouth in the morning., Disp: , Rfl:    carvedilol (COREG) 12.5 MG tablet, Take 1 tablet (12.5 mg total) by mouth 2 (two) times daily., Disp: 180 tablet, Rfl: 3   diphenhydramine-acetaminophen (TYLENOL PM) 25-500 MG TABS tablet, Take 2 tablets by mouth at bedtime., Disp: , Rfl:    doxylamine, Sleep, (UNISOM) 25 MG tablet, Take 25 mg by mouth at bedtime as needed for sleep., Disp: , Rfl:    ezetimibe (ZETIA) 10 MG tablet, Take 5 mg by mouth in the morning. Take one-half tablet (5 mg) by mouth daily, Disp: , Rfl:    fish oil-omega-3 fatty acids 1000 MG capsule, Take 2 g by mouth daily., Disp: , Rfl:    furosemide (LASIX) 40 MG tablet, TAKE 1 TABLET BY MOUTH DAILY, Disp: 90 tablet, Rfl: 3   glucose blood (ONETOUCH VERIO) test strip, USE TO TEST BLOOD SUGAR TWICE DAILY, Disp: , Rfl:    insulin glargine, 1 Unit Dial, (TOUJEO SOLOSTAR) 300 UNIT/ML Solostar Pen, Inject 15 Units into the skin at bedtime. (Patient taking differently: Inject 35 Units into the skin at bedtime.), Disp: 1.5 pen, Rfl: 0   Insulin Pen Needle (B-D UF III MINI PEN NEEDLES) 31G X 5 MM MISC, Use to inject Toujeo once daily., Disp: , Rfl:    Multiple Vitamins-Minerals (CENTRUM SILVER 50+WOMEN PO), Take 1 tablet by mouth daily., Disp: , Rfl:    nateglinide (STARLIX) 60 MG tablet, Take 60 mg by mouth 3 (three) times daily., Disp: , Rfl:    omeprazole (PRILOSEC) 20 MG capsule, Take 1 capsule (20 mg total) by mouth daily as needed (Acid  reflux). During severe episodes take 2 capsules until symptoms resolved, Disp: 30 capsule, Rfl: 6   sitaGLIPtin (JANUVIA) 25 MG tablet, Take 25 mg by mouth in the morning., Disp: , Rfl:     Radiology:   No results found.  Cardiac Studies:   Coronary Angiogram   [22-Feb-2014]:  Left and right heart catheterization 02/22/2014: Right atrial pressure 10 mmHg. Right ventricular pressure 32/2 with RVEDP of 15 mmHg. PA pressure 29/15 with mean of 22 mmHg. Pulmonary capillary wedge pressure mean of 6 mmHg. PA saturation 64% and  aortic saturation 92%. Fick cardiac output of 5.6 L and index 2.9 L/m. LVEF 55%.  Normal coronary arteries.  Echocardiogram 05/17/2020: Left ventricle cavity is normal in size. Mild concentric hypertrophy of the left ventricle. Normal global wall motion. Normal LV systolic function with EF 68%. Doppler evidence of grade II (pseudonormal) diastolic dysfunction, elevated LAP.  Left atrial cavity is mildly dilated. Mild (Grade I) aortic regurgitation. Moderate calcification of the mitral valve annulus. Mildly restricted mitral valve leaflets. Mild (Grade I) mitral regurgitation. Mild to moderate tricuspid regurgitation. Estimated pulmonary artery systolic pressure 28 mmHg. No significant change compared to previous study on 08/08/2017.   Device clinic CRTD 09/15/2015:  [place]    Remote CRT-D transmission 01/28/2022:  Longevity 1 year 8 months.  AP 5.8 %, CRT 99%.  Lead impedance and thresholds are normal.  There are frequent mode switches, EGM = persistent atrial fibrillation, 100%.  Thoracic impedance is at baseline with no suggestion of heart failure.  Normal CRT-D function.   EKG:   *** EKG 06/28/2021: Probably atrially paced rhythm with first-degree AV block, biventricular pacemaker detected.  No further analysis.  Compared to 12/14/2020, previously sinus rhythm.  No change in ventricularly paced rhythm.   Assessment     ICD-10-CM   1. Paroxysmal  atrial fibrillation (HCC)  I48.0     2. Chronic systolic congestive heart failure (HCC)  I50.22     3. ICD BV  09/15/2015:  [place] [phone] (serial  Number [id number]) in situ  Z95.810        No orders of the defined types were placed in this encounter.    There are no discontinued medications.   Recommendations:   Janet Owen  is a 88 y.o.  Caucasian female with nonischemic cardiomyopathy since 2015 with moderate to severe LV systolic dysfunction, EF 88%, paroxysmal atrial fibrillation, h/o atrial flutter s/p ablation on 07/31/2015, and has been on chronic amiodarone therapy, she has bi-V ICD placed on 11/09/2015, by Dr. Crissie Sickles.  Past medical history significant for hypertension, hyperlipidemia, type 2 diabetes mellitus and stage IV chronic kidney disease.  Due to persistent atrial fibrillation, underwent direct-current cardioversion on 01/26/2022 and amiodarone dose was increased to 200 mg daily.  1. Paroxysmal atrial fibrillation (HCC) ***  2. Chronic systolic congestive heart failure ([place]) ***  3. ICD BV  09/15/2015:  [employer] [place] model [id number] (serial  Number [id number]) in situ ***     Adrian Prows, MD, [facility] 02/07/2022, 3:36 PM Office: [phone] Fax: [phone] Pager: [phone]

## 2022-02-08 ENCOUNTER — Ambulatory Visit: Payer: Medicare Other | Admitting: Cardiology

## 2022-02-08 ENCOUNTER — Encounter: Payer: Self-pay | Admitting: Cardiology

## 2022-02-08 VITALS — BP 139/83 | HR 80 | Resp 16 | Ht 66.0 in | Wt 194.0 lb

## 2022-02-08 DIAGNOSIS — Z95810 Presence of automatic (implantable) cardiac defibrillator: Secondary | ICD-10-CM

## 2022-02-08 DIAGNOSIS — I480 Paroxysmal atrial fibrillation: Secondary | ICD-10-CM

## 2022-02-08 DIAGNOSIS — I5022 Chronic systolic (congestive) heart failure: Secondary | ICD-10-CM

## 2022-02-08 MED ORDER — FUROSEMIDE 40 MG PO TABS: 40.0000 mg | ORAL_TABLET | Freq: Every day | ORAL | 3 refills | Status: DC | PRN

## 2022-02-08 MED ORDER — APIXABAN 2.5 MG PO TABS: 2.5000 mg | ORAL_TABLET | Freq: Two times a day (BID) | ORAL | 3 refills | Status: DC

## 2022-02-08 MED ORDER — AMIODARONE HCL 200 MG PO TABS: 200.0000 mg | ORAL_TABLET | Freq: Every day | ORAL | 3 refills | Status: DC

## 2022-03-14 ENCOUNTER — Encounter: Payer: Self-pay | Admitting: Cardiology

## 2022-04-05 ENCOUNTER — Ambulatory Visit: Payer: Medicare Other | Admitting: Cardiology

## 2022-04-05 DIAGNOSIS — Z4502 Encounter for adjustment and management of automatic implantable cardiac defibrillator: Secondary | ICD-10-CM

## 2022-04-05 DIAGNOSIS — Z95810 Presence of automatic (implantable) cardiac defibrillator: Secondary | ICD-10-CM

## 2022-04-05 DIAGNOSIS — I5022 Chronic systolic (congestive) heart failure: Secondary | ICD-10-CM

## 2022-04-05 NOTE — Progress Notes (Signed)
Chief Complaint  Patient presents with   ICD check   Encounter for management of biventricular implantable cardioverter-defibrillator (ICD)  ICD BV  09/15/2015:  [place] model [phone] (serial  Number [id number]) in situ  Chronic systolic congestive heart failure ([place])  Scheduled  In office BiVCD 04/05/2022  Single (S)/Dual (D)/BV (M) B Presenting APBP Pacer dependant: No, Underlying SR with long PR 57/min.  AP 96%, BP >99% AMS Episodes Yes.  AT/AF burden <1%.   HVR 0.   Longevity 1.6 Years/Voltage.  Lead measurements: Stable Histogram: Low (L)/normal (N)/high (H)  Normal Patient activity Good. Thoracic impedance: Baseline.   Observations: [place] BV ICD function.  Changes: None

## 2022-06-27 ENCOUNTER — Other Ambulatory Visit: Payer: Medicare Other

## 2022-06-27 ENCOUNTER — Ambulatory Visit: Payer: Medicare Other

## 2022-06-27 DIAGNOSIS — I5022 Chronic systolic (congestive) heart failure: Secondary | ICD-10-CM

## 2022-06-27 DIAGNOSIS — I480 Paroxysmal atrial fibrillation: Secondary | ICD-10-CM

## 2022-08-08 ENCOUNTER — Encounter: Payer: Self-pay | Admitting: Cardiology

## 2022-08-08 ENCOUNTER — Ambulatory Visit: Payer: Medicare Other | Admitting: Cardiology

## 2022-08-08 VITALS — BP 132/73 | HR 88 | Resp 16 | Ht 66.0 in | Wt 194.2 lb

## 2022-08-08 DIAGNOSIS — Z95810 Presence of automatic (implantable) cardiac defibrillator: Secondary | ICD-10-CM

## 2022-08-08 DIAGNOSIS — N184 Chronic kidney disease, stage 4 (severe): Secondary | ICD-10-CM

## 2022-08-08 DIAGNOSIS — I480 Paroxysmal atrial fibrillation: Secondary | ICD-10-CM

## 2022-08-08 DIAGNOSIS — I10 Essential (primary) hypertension: Secondary | ICD-10-CM

## 2022-08-08 DIAGNOSIS — R2681 Unsteadiness on feet: Secondary | ICD-10-CM

## 2022-08-08 DIAGNOSIS — I5022 Chronic systolic (congestive) heart failure: Secondary | ICD-10-CM

## 2022-08-08 DIAGNOSIS — E1122 Type 2 diabetes mellitus with diabetic chronic kidney disease: Secondary | ICD-10-CM

## 2022-08-08 NOTE — Addendum Note (Signed)
Addended by: Delrae Rend on: 08/08/2022 11:14 AM   Modules accepted: Orders

## 2022-08-08 NOTE — Progress Notes (Addendum)
Primary Physician/Referring:  Iona Hansen, NP  Patient ID: Janet Owen, female    DOB: 1933-04-30, 89 y.o.   MRN: [id number]  Chief Complaint  Patient presents with   Paroxysmal atrial fibrillation   Chronic systolic congestive heart failure    Follow-up    6 months   HPI:    Janet Owen  is a 89 y.o. Caucasian female with nonischemic cardiomyopathy since 2015 with moderate to severe LV systolic dysfunction, EF 35%, paroxysmal atrial fibrillation, h/o atrial flutter s/p ablation on 07/31/2015, and has been on chronic amiodarone therapy, she has bi-V ICD placed on 11/09/2015, by Dr. Sharrell Ku.  Past medical history significant for hypertension, hyperlipidemia, type 2 diabetes mellitus and stage IV chronic kidney disease.    Due to persistent atrial fibrillation, underwent direct-current cardioversion on 01/26/2022 and amiodarone dose was increased to 200 mg daily.  She is presently asymptomatic.  Accompanied by husband.   Past Medical History:  Diagnosis Date   Atrial fibrillation (HCC)    DM (diabetes mellitus) (HCC)    Heart failure (HCC)    Hyperlipidemia    Hypertension    Past Surgical History:  Procedure Laterality Date   A-FLUTTER ABLATION N/A 07/30/2016   Procedure: A-Flutter Ablation;  Surgeon: Marinus Maw, MD;  Location: [facility] INVASIVE CV LAB;  Service: Cardiovascular;  Laterality: N/A;   APPENDECTOMY     BACK SURGERY     CARDIOVERSION N/A 01/26/2022   Procedure: CARDIOVERSION;  Surgeon: Yates Decamp, MD;  Location: [facility] ENDOSCOPY;  Service: Cardiovascular;  Laterality: N/A;   EP IMPLANTABLE DEVICE N/A 11/11/2015   Procedure: BiVi Upgrade;  Surgeon: Marinus Maw, MD;  Location: [facility] INVASIVE CV LAB;  Service: Cardiovascular;  Laterality: N/A;   EP IMPLANTABLE DEVICE N/A 09/15/2015   Procedure: BiV ICD Insertion CRT-D;  Surgeon: Will Jorja Loa, MD;  Location: [facility] INVASIVE CV LAB;  Service: Cardiovascular;  Laterality: N/A;   PARTIAL HYSTERECTOMY      REPLACEMENT TOTAL KNEE     TOTAL HIP ARTHROPLASTY Right 08/25/2019   Procedure: TOTAL HIP ARTHROPLASTY ANTERIOR APPROACH;  Surgeon: Durene Romans, MD;  Location: [facility] ORS;  Service: Orthopedics;  Laterality: Right;   VESICOVAGINAL FISTULA CLOSURE W/ TAH     Family History  Problem Relation Age of Onset   Asthma Mother    Pancreatitis Mother    Alcohol abuse Father     Social History   Tobacco Use   Smoking status: Never   Smokeless tobacco: Never  Substance Use Topics   Alcohol use: No    Alcohol/week: 0.0 standard drinks of alcohol   ROS  Review of Systems  Constitutional: Negative for malaise/fatigue.  Cardiovascular:  Positive for leg swelling (chronic, stable). Negative for chest pain, dyspnea on exertion, near-syncope and palpitations.  Musculoskeletal:  Positive for back pain and joint pain.  Neurological:  Negative for dizziness and light-headedness.   Objective  Blood pressure 132/73, pulse 88, resp. rate 16, height 5\' 6"  (1.676 m), weight 194 lb 3.2 oz (88.1 kg), SpO2 91 %.     08/08/2022   10:33 AM 02/08/2022   10:19 AM 02/08/2022   10:16 AM  Vitals with BMI  Height 5\' 6"   5\' 6"   Weight 194 lbs 3 oz  194 lbs  BMI 31.36  31.33  Systolic 132 139 034  Diastolic 73 83 84  Pulse 88 80 79     Physical Exam Neck:     Vascular: No carotid bruit or JVD.  Cardiovascular:     Rate and Rhythm: Normal rate and regular rhythm.     Pulses: Intact distal pulses.          Dorsalis pedis pulses are 1+ on the right side and 1+ on the left side.     Heart sounds: Normal heart sounds. No murmur heard.    No gallop.  Pulmonary:     Effort: Pulmonary effort is normal.     Breath sounds: Normal breath sounds.  Abdominal:     General: Bowel sounds are normal.     Palpations: Abdomen is soft.  Musculoskeletal:     Right lower leg: Edema (1-2+ chronic) present.     Left lower leg: Edema (1-2 + chronic) present.     Comments: Bilateral superficial varicose veins noted.     Laboratory examination:   Lab Results  Component Value Date   NA 142 01/26/2022   K 4.3 01/26/2022   CO2 25 12/31/2019   GLUCOSE 101 (H) 01/26/2022   BUN 37 (H) 01/26/2022   CREATININE 2.10 (H) 01/26/2022   CALCIUM 9.1 12/31/2019   GFRNONAA 31 (L) 12/31/2019       Latest Ref Rng & Units 01/26/2022    8:19 AM 12/31/2019    5:05 AM 12/30/2019    7:22 PM  CBC  WBC 4.0 - 10.5 K/uL  7.6  9.2   Hemoglobin 12.0 - 15.0 g/dL 16.1  09.6  04.5   Hematocrit 36.0 - 46.0 % 44.0  41.5  44.5   Platelets 150 - 400 K/uL  121  122    External labs:  07/19/2020:  A1c 7.5%.  TSH normal at 3.31.  BUN 40, creatinine 2.15, potassium 5.2.  eGFR 22 mL.  10/23/2019:  A1c 7.4%.  Sodium 139, potassium 4.9, BUN 39, creatinine 1.70, EGFR 27 mL.  Total cholesterol 150, triglycerides 159, HDL 61, LDL 57.  Radiology:   No results found.  Cardiac Studies:   Coronary Angiogram   [Mar 05, 2014]:  Left and right heart catheterization March 05, 2014: Right atrial pressure 10 mmHg. Right ventricular pressure 32/2 with RVEDP of 15 mmHg. PA pressure 29/15 with mean of 22 mmHg. Pulmonary capillary wedge pressure mean of 6 mmHg. PA saturation 64% and aortic saturation 92%. Fick cardiac output of 5.6 L and index 2.9 L/m. LVEF 55%.  Normal coronary arteries.  Echocardiogram 05/17/2020: Left ventricle cavity is normal in size. Mild concentric hypertrophy of the left ventricle. Normal global wall motion. Normal LV systolic function with EF 68%. Doppler evidence of grade II (pseudonormal) diastolic dysfunction, elevated LAP.  Left atrial cavity is mildly dilated. Mild (Grade I) aortic regurgitation. Moderate calcification of the mitral valve annulus. Mildly restricted mitral valve leaflets. Mild (Grade I) mitral regurgitation. Mild to moderate tricuspid regurgitation. Estimated pulmonary artery systolic pressure 28 mmHg. No significant change compared to previous study on 08/08/2017.    Device clinic CRTD  09/15/2015:  [employer]. [employer] [place]    Remote CRT-D transmission 07/30/2022 Longevity 1 year and 4 months.  AP 98 %, CRT 99%.  Lead impedance and thresholds are normal.  There were no mode switches.  AT/AF burden 0%. Thoracic impedance is at baseline with no suggestion of heart failure.  Normal CRT-D function.   EKG:   EKG 08/08/2022: AP, BiV paced rhythm.  No further analysis.  Compared to 02/08/2022, atrial pacing absent, patient was in sinus rhythm.  Otherwise no change.   Medications and allergies   Allergies  Allergen Reactions   Byetta 10 Mcg  Pen [Exenatide] Nausea Only    Dizziness   Evista [Raloxifene] Other (See Comments)    hot flashes    Current Outpatient Medications:    amiodarone (PACERONE) 200 MG tablet, Take 1 tablet (200 mg total) by mouth daily., Disp: 90 tablet, Rfl: 3   apixaban (ELIQUIS) 2.5 MG TABS tablet, Take 1 tablet (2.5 mg total) by mouth 2 (two) times daily., Disp: 180 tablet, Rfl: 3   atorvastatin (LIPITOR) 20 MG tablet, Take 20 mg by mouth in the morning., Disp: , Rfl:    carvedilol (COREG) 12.5 MG tablet, Take 1 tablet (12.5 mg total) by mouth 2 (two) times daily., Disp: 180 tablet, Rfl: 3   diphenhydramine-acetaminophen (TYLENOL PM) 25-500 MG TABS tablet, Take 2 tablets by mouth at bedtime., Disp: , Rfl:    ezetimibe (ZETIA) 10 MG tablet, Take 5 mg by mouth in the morning. Take one-half tablet (5 mg) by mouth daily, Disp: , Rfl:    furosemide (LASIX) 40 MG tablet, Take 1 tablet (40 mg total) by mouth daily as needed for edema or fluid (Take if weight increase >3Lbs in 2 days. Shortness of breath, leg swelling)., Disp: 90 tablet, Rfl: 3   glucose blood (ONETOUCH VERIO) test strip, USE TO TEST BLOOD SUGAR TWICE DAILY, Disp: , Rfl:    insulin glargine, 1 Unit Dial, (TOUJEO SOLOSTAR) 300 UNIT/ML Solostar Pen, Inject 15 Units into the skin at bedtime. (Patient taking differently: Inject 35 Units into the skin at bedtime.), Disp: 1.5 pen, Rfl: 0   Insulin Pen  Needle (B-D UF III MINI PEN NEEDLES) 31G X 5 MM MISC, Use to inject Toujeo once daily., Disp: , Rfl:    Multiple Vitamins-Minerals (CENTRUM SILVER 50+WOMEN PO), Take 1 tablet by mouth daily., Disp: , Rfl:    nateglinide (STARLIX) 60 MG tablet, Take 60 mg by mouth 3 (three) times daily., Disp: , Rfl:    sitaGLIPtin (JANUVIA) 25 MG tablet, Take 25 mg by mouth in the morning., Disp: , Rfl:    Assessment     ICD-10-CM   1. Chronic systolic congestive heart failure (HCC)  I50.22 EKG 12-Lead    2. Paroxysmal atrial fibrillation (HCC)  I48.0     3. Essential (primary) hypertension  I10     4. Type 2 diabetes mellitus with stage 4 chronic kidney disease, with long-term current use of insulin (HCC)  E11.22    N18.4    Z79.4     5. ICD BV  09/15/2015:  St. Jude Medical [place] model [phone] (serial  Number [id number]) in situ  Z95.810     6. Gait instability  R26.81 Ambulatory referral to Physical Therapy     Orders Placed This Encounter  Procedures   Ambulatory referral to Physical Therapy    Referral Priority:   Routine    Referral Type:   Physical Medicine    Referral Reason:   Specialty Services Required    Requested Specialty:   Physical Therapy    Number of Visits Requested:   1   EKG 12-Lead     No orders of the defined types were placed in this encounter.    Medications Discontinued During This Encounter  Medication Reason   fish oil-omega-3 fatty acids 1000 MG capsule    omeprazole (PRILOSEC) 20 MG capsule    doxylamine, Sleep, (UNISOM) 25 MG tablet     Recommendations:   Janet Owen  is a 89 y.o.  Caucasian female with nonischemic cardiomyopathy since 2015 with moderate to  severe LV systolic dysfunction, EF 35%, paroxysmal atrial fibrillation, h/o atrial flutter s/p ablation on 07/31/2015, and has been on chronic amiodarone therapy, she has bi-V ICD placed on 11/09/2015, by Dr. Sharrell Ku.  Past medical history significant for hypertension, hyperlipidemia, type  2 diabetes mellitus and stage IV chronic kidney disease.  Due to persistent atrial fibrillation, underwent direct-current cardioversion on 01/26/2022 and amiodarone dose was increased to 200 mg daily.  1. Chronic systolic congestive heart failure (HCC) Patient is presently doing well without recurrence of heart failure symptoms, her LVEF has normalized since being on BiV ICD. - EKG 12-Lead  2. Paroxysmal atrial fibrillation [facility]) She is maintaining sinus rhythm since cardioversion in December 2023, she will continue with Eliquis indefinitely.  3. Essential (primary) hypertension Blood pressure is well-controlled and she is on appropriate medical therapy.  She does have stage IV chronic kidney disease and needs to be reestablished with nephrology.  Chauncey Mann would be an appropriate medication to try as long as serum creatinine remained stable, she will need very close monitoring hence have not   4. Type 2 diabetes mellitus with stage 4 chronic kidney disease, with long-term current use of insulin (HCC) I sent a text message to Ms. Zoe Lan, NP to set up an early appointment for the management of diabetes mellitus.  5. ICD BV  09/15/2015:  St. Jude Medical [place] model [phone] (serial  Number [id number]) in situ Her BiV ICD is functioning normally.  She has not had any recurrence of atrial fibrillation.  She will continue remote monitoring.  She still has approximately 1 year and 4 months of battery life.  I would like to see her back in 6 months for follow-up.   Yates Decamp, MD, [facility] 08/08/2022, 11:15 AM Office: [phone] Fax: [phone] Pager: [phone]

## 2022-10-20 ENCOUNTER — Other Ambulatory Visit: Payer: Self-pay | Admitting: Cardiology

## 2022-10-20 DIAGNOSIS — I5022 Chronic systolic (congestive) heart failure: Secondary | ICD-10-CM

## 2022-10-21 ENCOUNTER — Encounter: Payer: Self-pay | Admitting: Cardiology

## 2022-10-22 ENCOUNTER — Telehealth: Payer: Self-pay

## 2022-10-22 NOTE — Telephone Encounter (Signed)
Please have her take furosemide 40 mg daily for the next 1 week to 10 days until her weight is back to baseline and to be extremely careful and cautious about excess salt and eating out.

## 2022-10-23 ENCOUNTER — Other Ambulatory Visit: Payer: Self-pay

## 2022-10-23 DIAGNOSIS — I5022 Chronic systolic (congestive) heart failure: Secondary | ICD-10-CM

## 2022-10-23 MED ORDER — FUROSEMIDE 40 MG PO TABS
40.0000 mg | ORAL_TABLET | Freq: Every day | ORAL | 0 refills | Status: AC
Start: 2022-10-23 — End: ?

## 2022-10-23 NOTE — Telephone Encounter (Signed)
Patient made aware.

## 2022-10-29 ENCOUNTER — Encounter: Payer: Self-pay | Admitting: Cardiology

## 2022-12-21 ENCOUNTER — Ambulatory Visit (INDEPENDENT_AMBULATORY_CARE_PROVIDER_SITE_OTHER): Payer: Medicare Other

## 2022-12-21 DIAGNOSIS — I480 Paroxysmal atrial fibrillation: Secondary | ICD-10-CM | POA: Diagnosis not present

## 2022-12-25 LAB — CUP PACEART REMOTE DEVICE CHECK
Battery Remaining Longevity: 9 mo
Battery Remaining Percentage: 10 %
Battery Voltage: 2.68 V
Brady Statistic AP VP Percent: 98 %
Brady Statistic AP VS Percent: 1 %
Brady Statistic AS VP Percent: 2.2 %
Brady Statistic AS VS Percent: 1 %
Brady Statistic RA Percent Paced: 98 %
Date Time Interrogation Session: 20241108020016
HighPow Impedance: 71 Ohm
HighPow Impedance: 71 Ohm
Implantable Lead Connection Status: 753985
Implantable Lead Connection Status: 753985
Implantable Lead Connection Status: 753985
Implantable Lead Implant Date: 20170803
Implantable Lead Implant Date: 20170803
Implantable Lead Implant Date: 20170803
Implantable Lead Location: 753858
Implantable Lead Location: 753859
Implantable Lead Location: 753860
Implantable Lead Model: 4296
Implantable Pulse Generator Implant Date: 20170803
Lead Channel Impedance Value: 390 Ohm
Lead Channel Impedance Value: 460 Ohm
Lead Channel Impedance Value: 540 Ohm
Lead Channel Pacing Threshold Amplitude: 0.75 V
Lead Channel Pacing Threshold Amplitude: 0.75 V
Lead Channel Pacing Threshold Amplitude: 0.875 V
Lead Channel Pacing Threshold Pulse Width: 0.5 ms
Lead Channel Pacing Threshold Pulse Width: 0.5 ms
Lead Channel Pacing Threshold Pulse Width: 0.5 ms
Lead Channel Sensing Intrinsic Amplitude: 0.3 mV
Lead Channel Sensing Intrinsic Amplitude: 12 mV
Lead Channel Setting Pacing Amplitude: 2 V
Lead Channel Setting Pacing Amplitude: 2 V
Lead Channel Setting Pacing Amplitude: 2 V
Lead Channel Setting Pacing Pulse Width: 0.5 ms
Lead Channel Setting Pacing Pulse Width: 0.5 ms
Lead Channel Setting Sensing Sensitivity: 0.5 mV
Pulse Gen Serial Number: 7348146
Zone Setting Status: 755011

## 2023-01-01 NOTE — Progress Notes (Signed)
Remote ICD transmission.   

## 2023-01-16 ENCOUNTER — Ambulatory Visit: Payer: Medicare Other | Admitting: Cardiology

## 2023-01-16 ENCOUNTER — Ambulatory Visit: Payer: Medicare Other | Attending: Cardiology | Admitting: Cardiology

## 2023-01-16 ENCOUNTER — Encounter: Payer: Self-pay | Admitting: Cardiology

## 2023-01-16 VITALS — BP 118/60 | HR 62 | Ht 66.0 in | Wt 199.0 lb

## 2023-01-16 DIAGNOSIS — I10 Essential (primary) hypertension: Secondary | ICD-10-CM | POA: Insufficient documentation

## 2023-01-16 DIAGNOSIS — I428 Other cardiomyopathies: Secondary | ICD-10-CM | POA: Insufficient documentation

## 2023-01-16 DIAGNOSIS — I480 Paroxysmal atrial fibrillation: Secondary | ICD-10-CM | POA: Diagnosis present

## 2023-01-16 DIAGNOSIS — Z95810 Presence of automatic (implantable) cardiac defibrillator: Secondary | ICD-10-CM | POA: Diagnosis present

## 2023-01-16 NOTE — Patient Instructions (Signed)
Medication Instructions:  Your physician recommends that you continue on your current medications as directed. Please refer to the Current Medication list given to you today.  *If you need a refill on your cardiac medications before your next appointment, please call your pharmacy*   Lab Work: none If you have labs (blood work) drawn today and your tests are completely normal, you will receive your results only by: MyChart Message (if you have MyChart) OR A paper copy in the mail If you have any lab test that is abnormal or we need to change your treatment, we will call you to review the results.   Testing/Procedures: none   Follow-Up: At [facility], you and your health needs are our priority.  As part of our continuing mission to provide you with exceptional heart care, we have created designated Provider Care Teams.  These Care Teams include your primary Cardiologist (physician) and Advanced Practice Providers (APPs -  Physician Assistants and Nurse Practitioners) who all work together to provide you with the care you need, when you need it.  We recommend signing up for the patient portal called "MyChart".  Sign up information is provided on this After Visit Summary.  MyChart is used to connect with patients for Virtual Visits (Telemedicine).  Patients are able to view lab/test results, encounter notes, upcoming appointments, etc.  Non-urgent messages can be sent to your provider as well.   To learn more about what you can do with MyChart, go to [email or web address].    Your next appointment:   6 month(s)  Provider:   Yates Decamp, MD     Other Instructions Call our office and let us know what dose of Atorvastatin you are taking

## 2023-01-16 NOTE — Progress Notes (Signed)
Cardiology Office Note:  .   Date:  01/16/2023  ID:  Janet Owen, DOB 05-Aug-1933, MRN [id number] PCP: Karna Dupes, MD  [facility] Providers Cardiologist:  Yates Decamp, MD   History of Present Illness: .   Janet Owen is a 89 y.o. Caucasian female with nonischemic cardiomyopathy since 2015 with moderate to severe LV systolic dysfunction, EF 35%, paroxysmal atrial fibrillation, h/o atrial flutter s/p ablation on 07/31/2015, and has been on chronic amiodarone therapy, she has bi-V ICD placed on 11/09/2015, by Dr. Sharrell Ku with recovery of LV systolic function.  Past medical history significant for hypertension, hyperlipidemia, type 2 diabetes mellitus and stage IV chronic kidney disease.     Due to persistent atrial fibrillation, underwent direct-current cardioversion on 01/26/2022 and amiodarone dose was increased to 200 mg daily.  She is presently asymptomatic except for chronic mild leg edema.   Discussed the use of AI scribe software for clinical note transcription with the patient, who gave verbal consent to proceed.  History of Present Illness   The patient, with a history of non-ischemic cardiomyopathy, primary hypertension, paroxysmal atrial fibrillation, and chronic kidney disease, presents for a routine follow-up. She reports feeling tired but overall well. She has been participating in a rehabilitation program for balance and continues to walk regularly. She has not had any specific concerns. She has been maintaining a regular rhythm with no signs of heart failure or atrial fibrillation. Her kidney function has remained stable. She has some leg swelling, which has been a long-standing issue. She has been wearing stockings, but they are not the tight, prescribed ones.        Review of Systems  Cardiovascular:  Positive for leg swelling (chronic and stable). Negative for chest pain and dyspnea on exertion.    Labs    External Labs:  Labs  11/22/2022:  BUN 36, creatinine 2.13, EGFR 22 mL.  A1c 8.6%.  Vitamin D28.0.  Hb 12.1/HCT 38.7, platelets 161.  Labs 10/23/2019:  Total cholesterol 150, triglycerides 159, HDL 61, LDL 57.  Physical Exam:   VS:  BP 118/60 (BP Location: Right Arm, Patient Position: Sitting, Cuff Size: Large)   Pulse 62   Ht 5\' 6"  (1.676 m)   Wt 199 lb (90.3 kg)   BMI 32.12 kg/m    Wt Readings from Last 3 Encounters:  01/16/23 199 lb (90.3 kg)  08/08/22 194 lb 3.2 oz (88.1 kg)  02/08/22 194 lb (88 kg)     Physical Exam Neck:     Vascular: No carotid bruit or JVD.  Cardiovascular:     Rate and Rhythm: Normal rate and regular rhythm.     Pulses: Intact distal pulses.          Dorsalis pedis pulses are 1+ on the right side and 1+ on the left side.     Heart sounds: Normal heart sounds. No murmur heard.    No gallop.  Pulmonary:     Effort: Pulmonary effort is normal.     Breath sounds: Normal breath sounds.  Abdominal:     General: Bowel sounds are normal.     Palpations: Abdomen is soft.  Musculoskeletal:     Right lower leg: Edema (1-2+ chronic) present.     Left lower leg: Edema (1-2 + chronic) present.     Comments: Bilateral superficial varicose veins noted.     Studies Reviewed: .    Echocardiogram 06/27/2022: Normal LV systolic function with visual EF 55-60%. Left ventricle  cavity is normal in size. Moderate concentric remodeling of the left ventricle. Normal global wall motion. Indeterminate diastolic filling pattern. Calculated EF 53%. Left atrial cavity is severely dilated at 51.8 ml/m^2. Trileaflet aortic valve. Trace aortic regurgitation. Mild aortic valve leaflet calcification. Mild to moderate mitral regurgitation. Moderate calcification of the mitral valve annulus. Mild mitral valve leaflet thickening. E-wave dominant mitral inflow. Structurally normal tricuspid valve. Mild to moderate tricuspid regurgitation. Mild pulmonary hypertension. RVSP measures 39 mmHg Compared  to 05/2020, LA is severely dilated. No significant change in LVEFcompared to previous study on 08/08/2017.   P[employer]art remote ICD transmission 12/21/2022: AP 98%, CRT 99%.  AT/AF burden <1%.  Thoracic impedance is at baseline and does not suggest volume overload state.  EKG:        Medications and allergies    Allergies  Allergen Reactions   Byetta 10 Mcg Pen [Exenatide] Nausea Only    Dizziness   Evista [Raloxifene] Other (See Comments)    hot flashes     Current Outpatient Medications:    amiodarone (PACERONE) 200 MG tablet, Take 1 tablet (200 mg total) by mouth daily., Disp: 90 tablet, Rfl: 3   apixaban (ELIQUIS) 2.5 MG TABS tablet, Take 1 tablet (2.5 mg total) by mouth 2 (two) times daily., Disp: 180 tablet, Rfl: 3   carvedilol (COREG) 12.5 MG tablet, TAKE 1 TABLET(12.5 MG) BY MOUTH TWICE DAILY, Disp: 180 tablet, Rfl: 3   Cholecalciferol (VITAMIN D3) 25 MCG (1000 UT) CAPS, Take 1 capsule by mouth daily., Disp: , Rfl:    diphenhydramine-acetaminophen (TYLENOL PM) 25-500 MG TABS tablet, Take 2 tablets by mouth at bedtime., Disp: , Rfl:    ezetimibe (ZETIA) 10 MG tablet, Take 5 mg by mouth in the morning. Take one-half tablet (5 mg) by mouth daily, Disp: , Rfl:    furosemide (LASIX) 40 MG tablet, Take 1 tablet (40 mg total) by mouth daily., Disp: 90 tablet, Rfl: 0   glucose blood (ONETOUCH VERIO) test strip, USE TO TEST BLOOD SUGAR TWICE DAILY, Disp: , Rfl:    insulin glargine, 1 Unit Dial, (TOUJEO SOLOSTAR) 300 UNIT/ML Solostar Pen, Inject 15 Units into the skin at bedtime. (Patient taking differently: Inject 35 Units into the skin at bedtime.), Disp: 1.5 pen, Rfl: 0   Insulin Pen Needle (B-D UF III MINI PEN NEEDLES) 31G X 5 MM MISC, Use to inject Toujeo once daily., Disp: , Rfl:    nateglinide (STARLIX) 60 MG tablet, Take 60 mg by mouth 3 (three) times daily., Disp: , Rfl:    sitaGLIPtin (JANUVIA) 25 MG tablet, Take 25 mg by mouth in the morning., Disp: , Rfl:    atorvastatin (LIPITOR)  20 MG tablet, Take 20 mg by mouth in the morning., Disp: , Rfl:    Multiple Vitamins-Minerals (CENTRUM SILVER 50+WOMEN PO), Take 1 tablet by mouth daily. (Patient not taking: Reported on 01/16/2023), Disp: , Rfl:    ASSESSMENT AND PLAN: .      ICD-10-CM   1. Paroxysmal atrial fibrillation (HCC)  I48.0 EKG 12-Lead    2. Essential (primary) hypertension  I10 EKG 12-Lead    3. Non-ischemic cardiomyopathy (HCC)  I42.8 EKG 12-Lead    4. ICD BV  09/15/2015:  St. Jude Medical [place] model [phone] (serial  Number [id number]) in situ  Z95.810       CHA2DS2-VASc Score = 6  The patient's score is based upon: CHF History: 1 HTN History: 1 Diabetes History: 1 Stroke History: 0 Vascular Disease History: 0 Age  Score: 2 Gender Score: 1  Assessment and Plan    Non-ischemic Cardiomyopathy Stable with normal heart function on recent echocardiogram. No signs of heart failure. -Continue current medications including Carvedilol and Amiodarone.  Atrial Fibrillation Maintaining regular rhythm with no recent episodes of AFib. -Continue Eliquis 2.5mg  twice daily.  Primary Hypertension Well controlled. -Continue current antihypertensive regimen.  Chronic Kidney Disease Stable kidney function based on recent labs and consultation with nephrologist. -Continue current management.  Lower Extremity Edema Persistent, more pronounced in the left leg. Patient is using non-prescription compression stockings with limited efficacy. -Consider using tighter compression stockings, to be worn within 30 minutes of waking up.  Follow-up in 6 months.      I have reviewed her ICD data from transmission few weeks ago, no clinical evidence of heart failure, she has not had any atrial fibrillation, normal device function.  I also reviewed her external labs.  Renal function has remained stable.  No bleeding diathesis and hemoglobin is stable as well.  Signed,  Yates Decamp, MD, [facility] 01/16/2023, 8:44 AM [facility] [address] [place], [place] [address] Phone: [phone]. Fax:  [phone]

## 2023-02-03 ENCOUNTER — Other Ambulatory Visit: Payer: Self-pay | Admitting: Cardiology

## 2023-02-03 DIAGNOSIS — I480 Paroxysmal atrial fibrillation: Secondary | ICD-10-CM

## 2023-02-04 NOTE — Telephone Encounter (Signed)
Eliquis 2.5mg  refill request received. Patient is 89 years old, weight-90.3kg, Crea-2.17 on 09/07/22 via Care Everywhere from [facility], Diagnosis-Afib, and last seen by Dr. Mellody Memos on 01/16/23. Dose is appropriate based on dosing criteria. Will send in refill to requested pharmacy.

## 2023-03-08 ENCOUNTER — Ambulatory Visit (INDEPENDENT_AMBULATORY_CARE_PROVIDER_SITE_OTHER): Payer: Medicare Other

## 2023-03-08 DIAGNOSIS — I480 Paroxysmal atrial fibrillation: Secondary | ICD-10-CM

## 2023-03-09 LAB — CUP PACEART REMOTE DEVICE CHECK
Battery Remaining Longevity: 6 mo
Battery Remaining Percentage: 7 %
Battery Voltage: 2.65 V
Brady Statistic AP VP Percent: 99 %
Brady Statistic AP VS Percent: 1 %
Brady Statistic AS VP Percent: 1.4 %
Brady Statistic AS VS Percent: 1 %
Brady Statistic RA Percent Paced: 98 %
Date Time Interrogation Session: 20250124020017
HighPow Impedance: 59 Ohm
HighPow Impedance: 59 Ohm
Implantable Lead Connection Status: 753985
Implantable Lead Connection Status: 753985
Implantable Lead Connection Status: 753985
Implantable Lead Implant Date: 20170803
Implantable Lead Implant Date: 20170803
Implantable Lead Implant Date: 20170803
Implantable Lead Location: 753858
Implantable Lead Location: 753859
Implantable Lead Location: 753860
Implantable Lead Model: 4296
Implantable Pulse Generator Implant Date: 20170803
Lead Channel Impedance Value: 390 Ohm
Lead Channel Impedance Value: 430 Ohm
Lead Channel Impedance Value: 550 Ohm
Lead Channel Pacing Threshold Amplitude: 0.625 V
Lead Channel Pacing Threshold Amplitude: 0.75 V
Lead Channel Pacing Threshold Amplitude: 0.875 V
Lead Channel Pacing Threshold Pulse Width: 0.5 ms
Lead Channel Pacing Threshold Pulse Width: 0.5 ms
Lead Channel Pacing Threshold Pulse Width: 0.5 ms
Lead Channel Sensing Intrinsic Amplitude: 0.4 mV
Lead Channel Sensing Intrinsic Amplitude: 12 mV
Lead Channel Setting Pacing Amplitude: 2 V
Lead Channel Setting Pacing Amplitude: 2 V
Lead Channel Setting Pacing Amplitude: 2 V
Lead Channel Setting Pacing Pulse Width: 0.5 ms
Lead Channel Setting Pacing Pulse Width: 0.5 ms
Lead Channel Setting Sensing Sensitivity: 0.5 mV
Pulse Gen Serial Number: 7348146
Zone Setting Status: 755011

## 2023-03-11 ENCOUNTER — Encounter: Payer: Self-pay | Admitting: Internal Medicine

## 2023-04-08 ENCOUNTER — Ambulatory Visit (INDEPENDENT_AMBULATORY_CARE_PROVIDER_SITE_OTHER): Payer: Medicare Other

## 2023-04-08 DIAGNOSIS — I480 Paroxysmal atrial fibrillation: Secondary | ICD-10-CM

## 2023-04-08 DIAGNOSIS — I428 Other cardiomyopathies: Secondary | ICD-10-CM

## 2023-04-09 LAB — CUP PACEART REMOTE DEVICE CHECK
Battery Remaining Longevity: 5 mo
Battery Remaining Percentage: 6 %
Battery Voltage: 2.63 V
Brady Statistic AP VP Percent: 99 %
Brady Statistic AP VS Percent: 1 %
Brady Statistic AS VP Percent: 1.5 %
Brady Statistic AS VS Percent: 1 %
Brady Statistic RA Percent Paced: 98 %
Date Time Interrogation Session: 20250224020018
HighPow Impedance: 66 Ohm
HighPow Impedance: 66 Ohm
Implantable Lead Connection Status: 753985
Implantable Lead Connection Status: 753985
Implantable Lead Connection Status: 753985
Implantable Lead Implant Date: 20170803
Implantable Lead Implant Date: 20170803
Implantable Lead Implant Date: 20170803
Implantable Lead Location: 753858
Implantable Lead Location: 753859
Implantable Lead Location: 753860
Implantable Lead Model: 4296
Implantable Pulse Generator Implant Date: 20170803
Lead Channel Impedance Value: 340 Ohm
Lead Channel Impedance Value: 400 Ohm
Lead Channel Impedance Value: 460 Ohm
Lead Channel Pacing Threshold Amplitude: 0.625 V
Lead Channel Pacing Threshold Amplitude: 0.75 V
Lead Channel Pacing Threshold Amplitude: 0.875 V
Lead Channel Pacing Threshold Pulse Width: 0.5 ms
Lead Channel Pacing Threshold Pulse Width: 0.5 ms
Lead Channel Pacing Threshold Pulse Width: 0.5 ms
Lead Channel Sensing Intrinsic Amplitude: 0.2 mV
Lead Channel Sensing Intrinsic Amplitude: 12 mV
Lead Channel Setting Pacing Amplitude: 2 V
Lead Channel Setting Pacing Amplitude: 2 V
Lead Channel Setting Pacing Amplitude: 2 V
Lead Channel Setting Pacing Pulse Width: 0.5 ms
Lead Channel Setting Pacing Pulse Width: 0.5 ms
Lead Channel Setting Sensing Sensitivity: 0.5 mV
Pulse Gen Serial Number: 7348146
Zone Setting Status: 755011

## 2023-04-14 ENCOUNTER — Encounter: Payer: Self-pay | Admitting: Internal Medicine

## 2023-04-16 ENCOUNTER — Ambulatory Visit: Payer: Medicare Other | Admitting: Cardiology

## 2023-04-17 NOTE — Addendum Note (Signed)
 Addended by: Elease Etienne A on: 04/17/2023 11:32 AM   Modules accepted: Orders

## 2023-04-17 NOTE — Progress Notes (Signed)
 Remote ICD transmission.

## 2023-05-07 ENCOUNTER — Telehealth: Payer: Self-pay

## 2023-05-07 NOTE — Telephone Encounter (Signed)
 Alert received from [employer]:  Alert remote transmission:  Exceed AT/AF AF in progress from the 25th, under sensed on presenting, overall controlled rates  Hx of PAF, Eliquis per EPIC  Prior Pt of Dr. Jacinto Halim.  Will schedule follow up with next available EP APP to establish care and evaluate alert for afib.

## 2023-05-07 NOTE — Telephone Encounter (Signed)
 Outreach made to Pt.  Pt states she was unaware that she was in afib.  She states she may feel more fatigued.  Advised Pt she has about 3 months left on her battery.    Follow up made with Dr. Ladona Ridgel for 05/13/2023 to establish care.

## 2023-05-09 ENCOUNTER — Ambulatory Visit (INDEPENDENT_AMBULATORY_CARE_PROVIDER_SITE_OTHER): Payer: Medicare Other

## 2023-05-09 DIAGNOSIS — I428 Other cardiomyopathies: Secondary | ICD-10-CM

## 2023-05-09 LAB — CUP PACEART REMOTE DEVICE CHECK
Battery Remaining Longevity: 4 mo
Battery Remaining Percentage: 4 %
Battery Voltage: 2.62 V
Brady Statistic AP VP Percent: 98 %
Brady Statistic AP VS Percent: 1 %
Brady Statistic AS VP Percent: 1.8 %
Brady Statistic AS VS Percent: 1 %
Brady Statistic RA Percent Paced: 96 %
Date Time Interrogation Session: 20250327020016
HighPow Impedance: 64 Ohm
HighPow Impedance: 64 Ohm
Implantable Lead Connection Status: 753985
Implantable Lead Connection Status: 753985
Implantable Lead Connection Status: 753985
Implantable Lead Implant Date: 20170803
Implantable Lead Implant Date: 20170803
Implantable Lead Implant Date: 20170803
Implantable Lead Location: 753858
Implantable Lead Location: 753859
Implantable Lead Location: 753860
Implantable Lead Model: 4296
Implantable Pulse Generator Implant Date: 20170803
Lead Channel Impedance Value: 360 Ohm
Lead Channel Impedance Value: 410 Ohm
Lead Channel Impedance Value: 530 Ohm
Lead Channel Pacing Threshold Amplitude: 0.625 V
Lead Channel Pacing Threshold Amplitude: 0.75 V
Lead Channel Pacing Threshold Amplitude: 0.875 V
Lead Channel Pacing Threshold Pulse Width: 0.5 ms
Lead Channel Pacing Threshold Pulse Width: 0.5 ms
Lead Channel Pacing Threshold Pulse Width: 0.5 ms
Lead Channel Sensing Intrinsic Amplitude: 0.2 mV
Lead Channel Sensing Intrinsic Amplitude: 12 mV
Lead Channel Setting Pacing Amplitude: 2 V
Lead Channel Setting Pacing Amplitude: 2 V
Lead Channel Setting Pacing Amplitude: 2 V
Lead Channel Setting Pacing Pulse Width: 0.5 ms
Lead Channel Setting Pacing Pulse Width: 0.5 ms
Lead Channel Setting Sensing Sensitivity: 0.5 mV
Pulse Gen Serial Number: 7348146
Zone Setting Status: 755011

## 2023-05-13 ENCOUNTER — Encounter: Payer: Self-pay | Admitting: Internal Medicine

## 2023-05-13 ENCOUNTER — Ambulatory Visit: Attending: Internal Medicine | Admitting: Internal Medicine

## 2023-05-13 VITALS — BP 126/62 | HR 71 | Ht 67.0 in | Wt 184.0 lb

## 2023-05-13 DIAGNOSIS — I5022 Chronic systolic (congestive) heart failure: Secondary | ICD-10-CM | POA: Insufficient documentation

## 2023-05-13 DIAGNOSIS — I480 Paroxysmal atrial fibrillation: Secondary | ICD-10-CM | POA: Diagnosis not present

## 2023-05-13 LAB — CUP PACEART INCLINIC DEVICE CHECK
Battery Remaining Longevity: 3 mo
Brady Statistic RA Percent Paced: 95 %
Brady Statistic RV Percent Paced: 99.99 %
Date Time Interrogation Session: 20250331092814
HighPow Impedance: 58.5 Ohm
Implantable Lead Connection Status: 753985
Implantable Lead Connection Status: 753985
Implantable Lead Connection Status: 753985
Implantable Lead Implant Date: 20170803
Implantable Lead Implant Date: 20170803
Implantable Lead Implant Date: 20170803
Implantable Lead Location: 753858
Implantable Lead Location: 753859
Implantable Lead Location: 753860
Implantable Lead Model: 4296
Implantable Pulse Generator Implant Date: 20170803
Lead Channel Impedance Value: 375 Ohm
Lead Channel Impedance Value: 412.5 Ohm
Lead Channel Impedance Value: 537.5 Ohm
Lead Channel Pacing Threshold Amplitude: 0.75 V
Lead Channel Pacing Threshold Amplitude: 0.75 V
Lead Channel Pacing Threshold Amplitude: 1 V
Lead Channel Pacing Threshold Amplitude: 1 V
Lead Channel Pacing Threshold Amplitude: 1.25 V
Lead Channel Pacing Threshold Pulse Width: 0.5 ms
Lead Channel Pacing Threshold Pulse Width: 0.5 ms
Lead Channel Pacing Threshold Pulse Width: 0.5 ms
Lead Channel Pacing Threshold Pulse Width: 0.5 ms
Lead Channel Pacing Threshold Pulse Width: 0.5 ms
Lead Channel Sensing Intrinsic Amplitude: 0.2 mV
Lead Channel Sensing Intrinsic Amplitude: 11.7 mV
Lead Channel Setting Pacing Amplitude: 2 V
Lead Channel Setting Pacing Amplitude: 2 V
Lead Channel Setting Pacing Amplitude: 2.25 V
Lead Channel Setting Pacing Pulse Width: 0.5 ms
Lead Channel Setting Pacing Pulse Width: 0.5 ms
Lead Channel Setting Sensing Sensitivity: 0.5 mV
Pulse Gen Serial Number: 7348146
Zone Setting Status: 755011

## 2023-05-13 NOTE — Progress Notes (Signed)
 HPI Mrs. Ringler returns today after a long absence from our EP clinic. She has CHB, and LV dysfunction and underwent BIv ICD insertion about 7 years ago. The patient has developed atrial fib and underwent DCCV. She could not tell that she had gone back to atrial fib. She does not have palpitations. She is able to perform activities of daily living.  Allergies  Allergen Reactions   Byetta 10 Mcg Pen [Exenatide] Nausea Only    Dizziness   Evista [Raloxifene] Other (See Comments)    hot flashes     Current Outpatient Medications  Medication Sig Dispense Refill   amiodarone (PACERONE) 200 MG tablet TAKE 1 TABLET(200 MG) BY MOUTH DAILY 90 tablet 3   apixaban (ELIQUIS) 2.5 MG TABS tablet TAKE 1 TABLET(2.5 MG) BY MOUTH TWICE DAILY 180 tablet 1   atorvastatin (LIPITOR) 20 MG tablet Take 20 mg by mouth in the morning.     carvedilol (COREG) 12.5 MG tablet TAKE 1 TABLET(12.5 MG) BY MOUTH TWICE DAILY 180 tablet 3   Cholecalciferol (VITAMIN D3) 25 MCG (1000 UT) CAPS Take 1 capsule by mouth daily.     diphenhydramine-acetaminophen (TYLENOL PM) 25-500 MG TABS tablet Take 2 tablets by mouth at bedtime.     ezetimibe (ZETIA) 10 MG tablet Take 5 mg by mouth in the morning. Take one-half tablet (5 mg) by mouth daily     furosemide (LASIX) 40 MG tablet Take 1 tablet (40 mg total) by mouth daily. 90 tablet 0   glucose blood (ONETOUCH VERIO) test strip USE TO TEST BLOOD SUGAR TWICE DAILY     insulin glargine, 1 Unit Dial, (TOUJEO SOLOSTAR) 300 UNIT/ML Solostar Pen Inject 15 Units into the skin at bedtime. (Patient taking differently: Inject 35 Units into the skin at bedtime.) 1.5 pen 0   Insulin Pen Needle (B-D UF III MINI PEN NEEDLES) 31G X 5 MM MISC Use to inject Toujeo once daily.     Multiple Vitamins-Minerals (CENTRUM SILVER 50+WOMEN PO) Take 1 tablet by mouth daily.     nateglinide (STARLIX) 60 MG tablet Take 60 mg by mouth 3 (three) times daily.     sitaGLIPtin (JANUVIA) 25 MG tablet Take  25 mg by mouth in the morning.     No current facility-administered medications for this visit.     Past Medical History:  Diagnosis Date   Atrial fibrillation (HCC)    DM (diabetes mellitus) (HCC)    Heart failure (HCC)    Hyperlipidemia    Hypertension     ROS:   All systems reviewed and negative except as noted in the HPI.   Past Surgical History:  Procedure Laterality Date   A-FLUTTER ABLATION N/A 07/30/2016   Procedure: A-Flutter Ablation;  Surgeon: Marinus Maw, MD;  Location: [facility] INVASIVE CV LAB;  Service: Cardiovascular;  Laterality: N/A;   APPENDECTOMY     BACK SURGERY     CARDIOVERSION N/A 01/26/2022   Procedure: CARDIOVERSION;  Surgeon: Yates Decamp, MD;  Location: [facility] ENDOSCOPY;  Service: Cardiovascular;  Laterality: N/A;   EP IMPLANTABLE DEVICE N/A 11/11/2015   Procedure: BiVi Upgrade;  Surgeon: Marinus Maw, MD;  Location: [facility] INVASIVE CV LAB;  Service: Cardiovascular;  Laterality: N/A;   EP IMPLANTABLE DEVICE N/A 09/15/2015   Procedure: BiV ICD Insertion CRT-D;  Surgeon: Will Jorja Loa, MD;  Location: [facility] INVASIVE CV LAB;  Service: Cardiovascular;  Laterality: N/A;   PARTIAL HYSTERECTOMY     REPLACEMENT TOTAL KNEE  TOTAL HIP ARTHROPLASTY Right 08/25/2019   Procedure: TOTAL HIP ARTHROPLASTY ANTERIOR APPROACH;  Surgeon: Durene Romans, MD;  Location: [facility] ORS;  Service: Orthopedics;  Laterality: Right;   VESICOVAGINAL FISTULA CLOSURE W/ TAH       Family History  Problem Relation Age of Onset   Asthma Mother    Pancreatitis Mother    Alcohol abuse Father      Social History   Socioeconomic History   Marital status: Married    Spouse name: Not on file   Number of children: 2   Years of education: Not on file   Highest education level: Not on file  Occupational History   Occupation: Retired  Tobacco Use   Smoking status: Never   Smokeless tobacco: Never  Vaping Use   Vaping status: Never Used  Substance and Sexual Activity   Alcohol use: No     Alcohol/week: 0.0 standard drinks of alcohol   Drug use: No   Sexual activity: Never    Birth control/protection: None  Other Topics Concern   Not on file  Social History Narrative   Not on file   Social Drivers of Health   Financial Resource Strain: Low Risk  (04/11/2021)   Received from [facility]   Overall Financial Resource Strain (CARDIA)  Food Insecurity: Low Risk  (10/09/2022)   Received from [facility]   Hunger Vital Sign    Worried About Running Out of Food in the Last Year: Never true    Ran Out of Food in the Last Year: Never true  Transportation Needs: No Transportation Needs (10/09/2022)   Received from [facility]    In the past 12 months, has lack of reliable transportation kept you from medical appointments, meetings, work or from getting things needed for daily living? : No  Physical Activity: Inactive (04/11/2021)   Received from [facility]   Exercise Vital Sign  Stress: No Stress Concern Present (04/11/2021)   Received from [facility] - Occupational Stress Questionnaire  Social Connections: Unknown (04/11/2021)   [facility] visits prior to 04/14/2022., [facility], [facility] visits prior to 04/14/2022., [facility]   Social Connection and Isolation Panel [NHANES]    Frequency of Communication with Friends and Family: More than three times a week    Frequency of Social Gatherings with Friends and Family: More than three times a week    Attends Religious Services: Patient declined    Active Member of Clubs or Organizations: No    Attends Banker Meetings: Never    Marital Status: Married  Catering manager Violence: Not At Risk (04/11/2021)   [facility] from [facility] visits prior to 04/14/2022., [facility] visits prior to 04/14/2022.   Humiliation, Afraid, Rape, and Kick  questionnaire    Fear of Current or Ex-Partner: No    Emotionally Abused: No    Physically Abused: No    Sexually Abused: No     BP 126/62   Pulse 71   Ht 5\' 7"  (1.702 m)   Wt 184 lb (83.5 kg)   SpO2 96%   BMI 28.82 kg/m   Physical Exam:  Well appearing NAD HEENT: Unremarkable Neck:  No JVD, no thyromegally Lymphatics:  No adenopathy Back:  No CVA tenderness Lungs:  Clear with no wheezes HEART:  Regular rate rhythm, no murmurs, no rubs, no clicks Abd:  soft, positive bowel sounds, no organomegally, no rebound, no guarding Ext:  2 plus pulses, no edema, no cyanosis, no clubbing Skin:  No rashes no nodules Neuro:  CN II through XII intact, motor grossly intact  EKG - afib with ventricular pacing  DEVICE  Normal device function.  See PaceArt for details.   Assess/Plan: Persistent atrial fib - she has minimal if any symptoms. I have asked her to stop the amiodarone. Rate control. ICD - her device is 3 months from ERI. We will plan gen change out at [facility] Might consider downgrade to a biv ppm. HTN - her bp is well controlled. No change in meds.   Sharlot Gowda Orvill Coulthard,MD

## 2023-05-13 NOTE — Patient Instructions (Addendum)
 Medication Instructions:  Your physician has recommended you make the following change in your medication:  Stop amiodarone.  Lab Work: None ordered.  You may go to any [facility] Location for your lab work:  [facility] - [address] ([facility]) - [address] [phone] [address]  [place] - [address]   [place]  - [address]   [place] - [address] - [address] [employer]  - [address] - [address] ([facility]   If you have labs (blood work) drawn today and your tests are completely normal, you will receive your results only by: [employer] (if you have MyChart)  If you have any lab test that is abnormal or we need to change your treatment, we will call you or send a MyChart message to review the results.  Testing/Procedures: None ordered.  Follow-Up: At [facility], you and your health needs are our priority.  As part of our continuing mission to provide you with exceptional heart care, we have created designated Provider Care Teams.  These Care Teams include your primary Cardiologist (physician) and Advanced Practice Providers (APPs -  Physician Assistants and Nurse Practitioners) who all work together to provide you with the care you need, when you need it.  We recommend signing up for the patient portal called "MyChart".  Sign up information is provided on this After Visit Summary.  MyChart is used to connect with patients for Virtual Visits (Telemedicine).  Patients are able to view lab/test results, encounter notes, upcoming appointments, etc.  Non-urgent messages can be sent to your provider as well.   To learn more about what you can do with MyChart, go to [email or web address].    Your next appointment:   7 months  The format for your next appointment:   In Person  Provider:   Lewayne Bunting, MD{or one of the  following Advanced Practice Providers on your designated Care Team:   Francis Dowse, [place] Casimiro Needle "Mardelle Matte" [place], [place] Earnest Rosier, NP  Note: Remote monitoring is used to monitor your Pacemaker/ ICD from home. This monitoring reduces the number of office visits required to check your device to one time per year. It allows [place] to keep an eye on the functioning of your device to ensure it is working properly.            Valet parking services will be available as well.

## 2023-05-15 NOTE — Addendum Note (Signed)
 Addended by: Geralyn Flash D on: 05/15/2023 02:26 PM   Modules accepted: Orders, Level of Service

## 2023-05-15 NOTE — Progress Notes (Signed)
 Remote ICD transmission.

## 2023-05-19 ENCOUNTER — Encounter: Payer: Self-pay | Admitting: Internal Medicine

## 2023-06-10 ENCOUNTER — Ambulatory Visit (INDEPENDENT_AMBULATORY_CARE_PROVIDER_SITE_OTHER): Payer: Medicare Other

## 2023-06-10 DIAGNOSIS — I5022 Chronic systolic (congestive) heart failure: Secondary | ICD-10-CM

## 2023-06-10 DIAGNOSIS — I428 Other cardiomyopathies: Secondary | ICD-10-CM

## 2023-06-12 ENCOUNTER — Encounter: Payer: Self-pay | Admitting: Internal Medicine

## 2023-06-12 LAB — CUP PACEART REMOTE DEVICE CHECK
Battery Remaining Longevity: 3 mo
Battery Remaining Percentage: 3 %
Battery Voltage: 2.62 V
Date Time Interrogation Session: 20250428020019
HighPow Impedance: 54 Ohm
HighPow Impedance: 54 Ohm
Implantable Lead Connection Status: 753985
Implantable Lead Connection Status: 753985
Implantable Lead Connection Status: 753985
Implantable Lead Implant Date: 20170803
Implantable Lead Implant Date: 20170803
Implantable Lead Implant Date: 20170803
Implantable Lead Location: 753858
Implantable Lead Location: 753859
Implantable Lead Location: 753860
Implantable Lead Model: 4296
Implantable Pulse Generator Implant Date: 20170803
Lead Channel Impedance Value: 340 Ohm
Lead Channel Impedance Value: 390 Ohm
Lead Channel Impedance Value: 460 Ohm
Lead Channel Pacing Threshold Amplitude: 0.625 V
Lead Channel Pacing Threshold Amplitude: 0.875 V
Lead Channel Pacing Threshold Pulse Width: 0.5 ms
Lead Channel Pacing Threshold Pulse Width: 0.5 ms
Lead Channel Sensing Intrinsic Amplitude: 0.2 mV
Lead Channel Sensing Intrinsic Amplitude: 11.7 mV
Lead Channel Setting Pacing Amplitude: 2 V
Lead Channel Setting Pacing Amplitude: 2 V
Lead Channel Setting Pacing Pulse Width: 0.5 ms
Lead Channel Setting Pacing Pulse Width: 0.5 ms
Lead Channel Setting Sensing Sensitivity: 0.5 mV
Pulse Gen Serial Number: 7348146
Zone Setting Status: 755011

## 2023-06-18 ENCOUNTER — Ambulatory Visit (INDEPENDENT_AMBULATORY_CARE_PROVIDER_SITE_OTHER): Admitting: Physician Assistant

## 2023-06-18 ENCOUNTER — Other Ambulatory Visit (INDEPENDENT_AMBULATORY_CARE_PROVIDER_SITE_OTHER): Payer: Self-pay

## 2023-06-18 VITALS — Wt 201.0 lb

## 2023-06-18 DIAGNOSIS — M7052 Other bursitis of knee, left knee: Secondary | ICD-10-CM

## 2023-06-18 MED ORDER — BUPIVACAINE HCL 0.25 % IJ SOLN
2.0000 mL | INTRAMUSCULAR | Status: AC | PRN
  Administered 2023-06-18: 2 mL via INTRA_ARTICULAR

## 2023-06-18 MED ORDER — TRAMADOL HCL 50 MG PO TABS: 50.0000 mg | ORAL_TABLET | Freq: Two times a day (BID) | ORAL | 2 refills | Status: DC | PRN

## 2023-06-18 MED ORDER — LIDOCAINE HCL 1 % IJ SOLN
2.0000 mL | INTRAMUSCULAR | Status: AC | PRN
  Administered 2023-06-18: 2 mL

## 2023-06-18 MED ORDER — METHYLPREDNISOLONE ACETATE 40 MG/ML IJ SUSP
40.0000 mg | INTRAMUSCULAR | Status: AC | PRN
  Administered 2023-06-18: 40 mg via INTRA_ARTICULAR

## 2023-06-18 NOTE — Progress Notes (Signed)
 Office Visit Note   Patient: Janet Owen           Date of Birth: Nov 23, 1933           MRN: [id number] Visit Date: 06/18/2023              Requested by: Hoover Luz, MD [address] [place],  [place] [address] PCP: Hoover Luz, MD   Assessment & Plan: Visit Diagnoses:  1. Pes anserinus bursitis of left knee     Plan: Impression is left knee pes bursitis.  There is nothing today to suggest infection or prosthetic joint loosening.  The majority of her pain is to the pes bursa.  We have discussed injecting this with cortisone.  If her symptoms persist she will call and let us  know.  Follow-up as needed otherwise.  Follow-Up Instructions: Return if symptoms worsen or fail to improve.   Orders:  Orders Placed This Encounter  Procedures   Large Joint Inj: L knee   XR Knee 1-2 Views Left   Meds ordered this encounter  Medications   traMADol (ULTRAM) 50 MG tablet    Sig: Take 1 tablet (50 mg total) by mouth 2 (two) times daily as needed.    Dispense:  60 tablet    Refill:  2      Procedures: Large Joint Inj: L knee on 06/18/2023 1:18 PM Indications: pain Details: 22 G needle, anterolateral approach Medications: 2 mL lidocaine  1 %; 2 mL bupivacaine  0.25 %; 40 mg methylPREDNISolone acetate 40 MG/ML      Clinical Data: No additional findings.   Subjective: Chief Complaint  Patient presents with   Left Knee - Pain    HPI patient is a pleasant 89 year old female who comes in today with left knee pain.  She is status post left total knee replacement in 2005 by a surgeon in [place] .  She was doing well until about 5 days ago when her symptoms began.  All of her pain is to the medial aspect.  Symptoms occur when she is standing and walking.  She gets relief at rest.  She has been taking Tylenol  without relief.  She denies any injury or change in activity.  No fevers or chills or any other systemic symptoms.  No pain into the thigh or groin.  Review of  Systems as detailed in HPI.  All others reviewed and are negative.   Objective: Vital Signs: Wt 201 lb (91.2 kg)   BMI 31.48 kg/m   Physical Exam well-developed well-nourished female in no acute distress.  Alert and oriented x 3.  Ortho Exam left knee exam: She does have moderate swelling around the pes bursa.  No actual joint effusion.  Range of motion 0 to 120 degrees.  She has mild tenderness to the medial joint line.  Exquisite tenderness over the pes bursa.  She is stable to valgus and varus stress.  She is neurovascularly intact distally.  Specialty Comments:  No specialty comments available.  Imaging: XR Knee 1-2 Views Left Result Date: 06/18/2023 Well-seated prosthesis without complication    PMFS History: Patient Active Problem List   Diagnosis Date Noted   Persistent atrial fibrillation (HCC) 01/26/2022   UTI (urinary tract infection) 12/30/2019   Hip fracture (HCC) 08/24/2019   PAF (paroxysmal atrial fibrillation) (HCC) 08/24/2019   DNR (do not resuscitate) 08/24/2019   ICD BV  09/15/2015:  [employer] Elmo model [id number] (serial  Number [id number]) in situ 07/23/2018  Encounter for management of biventricular implantable cardioverter-defibrillator (ICD) 07/23/2018   Type 2 diabetes mellitus with hyperglycemia (HCC) 08/31/2016   CKD (chronic kidney disease) stage 3, GFR 30-59 ml/min 08/14/2016   Mixed dyslipidemia 08/14/2016   Vitamin D insufficiency 08/14/2016   Chronic systolic heart failure (HCC) 11/11/2015   Chronic systolic congestive heart failure (HCC)    Dyspnea 08/20/2014   Abnormal CT scan, chest 08/20/2014   Essential (primary) hypertension 08/20/2014   Abnormal computed tomography scan 08/20/2014   Encounter to establish care 08/09/2014   Past Medical History:  Diagnosis Date   Atrial fibrillation (HCC)    DM (diabetes mellitus) (HCC)    Heart failure (HCC)    Hyperlipidemia    Hypertension     Family History  Problem Relation Age  of Onset   Asthma Mother    Pancreatitis Mother    Alcohol abuse Father     Past Surgical History:  Procedure Laterality Date   A-FLUTTER ABLATION N/A 07/30/2016   Procedure: A-Flutter Ablation;  Surgeon: Tammie Fall, MD;  Location: [facility] INVASIVE CV LAB;  Service: Cardiovascular;  Laterality: N/A;   APPENDECTOMY     BACK SURGERY     CARDIOVERSION N/A 01/26/2022   Procedure: CARDIOVERSION;  Surgeon: Knox Perl, MD;  Location: [facility] ENDOSCOPY;  Service: Cardiovascular;  Laterality: N/A;   EP IMPLANTABLE DEVICE N/A 11/11/2015   Procedure: BiVi Upgrade;  Surgeon: Tammie Fall, MD;  Location: [facility] INVASIVE CV LAB;  Service: Cardiovascular;  Laterality: N/A;   EP IMPLANTABLE DEVICE N/A 09/15/2015   Procedure: BiV ICD Insertion CRT-D;  Surgeon: Will Cortland Ding, MD;  Location: [facility] INVASIVE CV LAB;  Service: Cardiovascular;  Laterality: N/A;   PARTIAL HYSTERECTOMY     REPLACEMENT TOTAL KNEE     TOTAL HIP ARTHROPLASTY Right 08/25/2019   Procedure: TOTAL HIP ARTHROPLASTY ANTERIOR APPROACH;  Surgeon: Claiborne Crew, MD;  Location: [facility] ORS;  Service: Orthopedics;  Laterality: Right;   VESICOVAGINAL FISTULA CLOSURE W/ TAH     Social History   Occupational History   Occupation: Retired  Tobacco Use   Smoking status: Never   Smokeless tobacco: Never  Vaping Use   Vaping status: Never Used  Substance and Sexual Activity   Alcohol use: No    Alcohol/week: 0.0 standard drinks of alcohol   Drug use: No   Sexual activity: Never    Birth control/protection: None

## 2023-06-20 NOTE — Progress Notes (Signed)
 Remote ICD transmission.

## 2023-07-05 ENCOUNTER — Emergency Department (HOSPITAL_BASED_OUTPATIENT_CLINIC_OR_DEPARTMENT_OTHER)
Admission: EM | Admit: 2023-07-05 | Discharge: 2023-07-05 | Disposition: A | Discharge status: Home / Self Care | Attending: Emergency Medicine | Admitting: Emergency Medicine

## 2023-07-05 ENCOUNTER — Emergency Department (HOSPITAL_BASED_OUTPATIENT_CLINIC_OR_DEPARTMENT_OTHER)

## 2023-07-05 ENCOUNTER — Encounter (HOSPITAL_BASED_OUTPATIENT_CLINIC_OR_DEPARTMENT_OTHER): Payer: Self-pay | Admitting: *Deleted

## 2023-07-05 ENCOUNTER — Other Ambulatory Visit: Payer: Self-pay

## 2023-07-05 DIAGNOSIS — N183 Chronic kidney disease, stage 3 unspecified: Secondary | ICD-10-CM | POA: Diagnosis not present

## 2023-07-05 DIAGNOSIS — N3000 Acute cystitis without hematuria: Secondary | ICD-10-CM | POA: Diagnosis not present

## 2023-07-05 DIAGNOSIS — D649 Anemia, unspecified: Secondary | ICD-10-CM | POA: Diagnosis not present

## 2023-07-05 DIAGNOSIS — E1122 Type 2 diabetes mellitus with diabetic chronic kidney disease: Secondary | ICD-10-CM | POA: Insufficient documentation

## 2023-07-05 DIAGNOSIS — Z794 Long term (current) use of insulin: Secondary | ICD-10-CM | POA: Insufficient documentation

## 2023-07-05 DIAGNOSIS — Z79899 Other long term (current) drug therapy: Secondary | ICD-10-CM | POA: Diagnosis not present

## 2023-07-05 DIAGNOSIS — E1165 Type 2 diabetes mellitus with hyperglycemia: Secondary | ICD-10-CM | POA: Diagnosis not present

## 2023-07-05 DIAGNOSIS — I130 Hypertensive heart and chronic kidney disease with heart failure and stage 1 through stage 4 chronic kidney disease, or unspecified chronic kidney disease: Secondary | ICD-10-CM | POA: Diagnosis not present

## 2023-07-05 DIAGNOSIS — Z7901 Long term (current) use of anticoagulants: Secondary | ICD-10-CM | POA: Insufficient documentation

## 2023-07-05 DIAGNOSIS — R531 Weakness: Secondary | ICD-10-CM | POA: Diagnosis present

## 2023-07-05 DIAGNOSIS — I509 Heart failure, unspecified: Secondary | ICD-10-CM | POA: Insufficient documentation

## 2023-07-05 LAB — CBC
HCT: 35.4 % — ABNORMAL LOW (ref 36.0–46.0)
Hemoglobin: 10.4 g/dL — ABNORMAL LOW (ref 12.0–15.0)
MCH: 23.5 pg — ABNORMAL LOW (ref 26.0–34.0)
MCHC: 29.4 g/dL — ABNORMAL LOW (ref 30.0–36.0)
MCV: 80.1 fL (ref 80.0–100.0)
Platelets: 174 10*3/uL (ref 150–400)
RBC: 4.42 MIL/uL (ref 3.87–5.11)
RDW: 17.8 % — ABNORMAL HIGH (ref 11.5–15.5)
WBC: 8.9 10*3/uL (ref 4.0–10.5)
nRBC: 0 % (ref 0.0–0.2)

## 2023-07-05 LAB — COMPREHENSIVE METABOLIC PANEL WITH GFR
ALT: 16 U/L (ref 0–44)
AST: 19 U/L (ref 15–41)
Albumin: 4.2 g/dL (ref 3.5–5.0)
Alkaline Phosphatase: 101 U/L (ref 38–126)
Anion gap: 12 (ref 5–15)
BUN: 26 mg/dL — ABNORMAL HIGH (ref 8–23)
CO2: 24 mmol/L (ref 22–32)
Calcium: 9.5 mg/dL (ref 8.9–10.3)
Chloride: 103 mmol/L (ref 98–111)
Creatinine, Ser: 1.81 mg/dL — ABNORMAL HIGH (ref 0.44–1.00)
GFR, Estimated: 26 mL/min — ABNORMAL LOW (ref >60)
Glucose, Bld: 204 mg/dL — ABNORMAL HIGH (ref 70–99)
Potassium: 5.1 mmol/L (ref 3.5–5.1)
Sodium: 139 mmol/L (ref 135–145)
Total Bilirubin: 0.3 mg/dL (ref 0.0–1.2)
Total Protein: 7.3 g/dL (ref 6.5–8.1)

## 2023-07-05 LAB — URINALYSIS, ROUTINE W REFLEX MICROSCOPIC
Bilirubin Urine: NEGATIVE
Glucose, UA: NEGATIVE mg/dL
Hgb urine dipstick: NEGATIVE
Ketones, ur: NEGATIVE mg/dL
Nitrite: POSITIVE — AB
Protein, ur: NEGATIVE mg/dL
Specific Gravity, Urine: 1.015 (ref 1.005–1.030)
pH: 6 (ref 5.0–8.0)

## 2023-07-05 LAB — URINALYSIS, MICROSCOPIC (REFLEX)
RBC / HPF: NONE SEEN RBC/hpf (ref 0–5)
Squamous Epithelial / HPF: NONE SEEN /HPF (ref 0–5)

## 2023-07-05 LAB — TSH: TSH: 4.033 u[IU]/mL (ref 0.350–4.500)

## 2023-07-05 LAB — CBG MONITORING, ED: Glucose-Capillary: 190 mg/dL — ABNORMAL HIGH (ref 70–99)

## 2023-07-05 MED ORDER — CEPHALEXIN 500 MG PO CAPS: 500.0000 mg | ORAL_CAPSULE | Freq: Four times a day (QID) | ORAL | 0 refills | Status: DC

## 2023-07-05 MED ORDER — SODIUM CHLORIDE 0.9 % IV SOLN
1.0000 g | Freq: Once | INTRAVENOUS | Status: AC
  Administered 2023-07-05: 1 g via INTRAVENOUS
  Filled 2023-07-05: qty 10

## 2023-07-05 MED ORDER — CEPHALEXIN 500 MG PO CAPS: 500.0000 mg | ORAL_CAPSULE | Freq: Three times a day (TID) | ORAL | 0 refills | Status: AC

## 2023-07-05 NOTE — ED Provider Notes (Addendum)
 [place] [facility] AT [facility] Provider Note  CSN: [id number] Arrival date & time: 07/05/23 1222  Chief Complaint(s) Fatigue  HPI Janet Owen is a 89 y.o. female who is here today for generalized weakness. Patient reports that over the last 1 month, she has been having a lack of energy, decreased appetite, and felt more tired.  She says that this all began 3 weeks ago, prior to that she had felt fine.  She denies any fever, cough.  She denies any dark stool or blood in her stools.  She does report that she has a sore on her buttock that is currently being treated with zinc cream.  Patient has a past medical history significant for atrial fibrillation, AICD, on Eliquis .   Past Medical History Past Medical History:  Diagnosis Date   Atrial fibrillation (HCC)    DM (diabetes mellitus) (HCC)    Heart failure (HCC)    Hyperlipidemia    Hypertension    Patient Active Problem List   Diagnosis Date Noted   Persistent atrial fibrillation (HCC) 01/26/2022   UTI (urinary tract infection) 12/30/2019   Hip fracture (HCC) 08/24/2019   PAF (paroxysmal atrial fibrillation) (HCC) 08/24/2019   DNR (do not resuscitate) 08/24/2019   ICD BV  09/15/2015:  [employer] model [id number] (serial  Number [phone]) in situ 07/23/2018   Encounter for management of biventricular implantable cardioverter-defibrillator (ICD) 07/23/2018   Type 2 diabetes mellitus with hyperglycemia (HCC) 08/31/2016   CKD (chronic kidney disease) stage 3, GFR 30-59 ml/min 08/14/2016   Mixed dyslipidemia 08/14/2016   Vitamin D insufficiency 08/14/2016   Chronic systolic heart failure (HCC) 11/11/2015   Chronic systolic congestive heart failure (HCC)    Dyspnea 08/20/2014   Abnormal CT scan, chest 08/20/2014   Essential (primary) hypertension 08/20/2014   Abnormal computed tomography scan 08/20/2014   Encounter to establish care 08/09/2014   Home Medication(s) Prior to  Admission medications   Medication Sig Start Date End Date Taking? Authorizing Provider  apixaban  (ELIQUIS ) 2.5 MG TABS tablet TAKE 1 TABLET(2.5 MG) BY MOUTH TWICE DAILY 02/04/23   Knox Perl, MD  atorvastatin  (LIPITOR) 20 MG tablet Take 20 mg by mouth in the morning.    [provider]  carvedilol  (COREG ) 12.5 MG tablet TAKE 1 TABLET(12.5 MG) BY MOUTH TWICE DAILY 10/22/22   Knox Perl, MD  cephALEXin  (KEFLEX ) 500 MG capsule Take 1 capsule (500 mg total) by mouth 3 (three) times daily for 7 days. 07/05/23 07/12/23  Afton Horse T, DO  Cholecalciferol (VITAMIN D3) 25 MCG (1000 UT) CAPS Take 1 capsule by mouth daily.    [provider]  diphenhydramine-acetaminophen  (TYLENOL  PM) 25-500 MG TABS tablet Take 2 tablets by mouth at bedtime.    [provider]  ezetimibe  (ZETIA ) 10 MG tablet Take 5 mg by mouth in the morning. Take one-half tablet (5 mg) by mouth daily    [provider]  furosemide  (LASIX ) 40 MG tablet Take 1 tablet (40 mg total) by mouth daily. 10/23/22   Knox Perl, MD  glucose blood (ONETOUCH VERIO) test strip USE TO TEST BLOOD SUGAR TWICE DAILY 03/23/19   [provider]  insulin  glargine, 1 Unit Dial, (TOUJEO  SOLOSTAR) 300 UNIT/ML Solostar Pen Inject 15 Units into the skin at bedtime. Patient taking differently: Inject 35 Units into the skin at bedtime. 08/27/19 01/25/24  Etter Hermann., MD  Insulin  Pen Needle (B-D UF III MINI PEN NEEDLES) 31G X 5 MM  MISC Use to inject Toujeo  once daily. 01/30/19   [provider]  Multiple Vitamins-Minerals (CENTRUM SILVER 50+WOMEN PO) Take 1 tablet by mouth daily.    [provider]  nateglinide (STARLIX) 60 MG tablet Take 60 mg by mouth 3 (three) times daily. 12/29/21   [provider]  sitaGLIPtin  (JANUVIA ) 25 MG tablet Take 25 mg by mouth in the morning.    [provider]  traMADol  (ULTRAM ) 50 MG tablet Take 1 tablet (50 mg total) by mouth 2 (two) times daily as  needed. 06/18/23   Sandie Cross, PA-C                                                                                                                                    Past Surgical History Past Surgical History:  Procedure Laterality Date   A-FLUTTER ABLATION N/A 07/30/2016   Procedure: A-Flutter Ablation;  Surgeon: Tammie Fall, MD;  Location: [facility] INVASIVE CV LAB;  Service: Cardiovascular;  Laterality: N/A;   APPENDECTOMY     BACK SURGERY     CARDIOVERSION N/A 01/26/2022   Procedure: CARDIOVERSION;  Surgeon: Knox Perl, MD;  Location: [facility] ENDOSCOPY;  Service: Cardiovascular;  Laterality: N/A;   EP IMPLANTABLE DEVICE N/A 11/11/2015   Procedure: BiVi Upgrade;  Surgeon: Tammie Fall, MD;  Location: [facility] INVASIVE CV LAB;  Service: Cardiovascular;  Laterality: N/A;   EP IMPLANTABLE DEVICE N/A 09/15/2015   Procedure: BiV ICD Insertion CRT-D;  Surgeon: Will Cortland Ding, MD;  Location: [facility] INVASIVE CV LAB;  Service: Cardiovascular;  Laterality: N/A;   PARTIAL HYSTERECTOMY     REPLACEMENT TOTAL KNEE     TOTAL HIP ARTHROPLASTY Right 08/25/2019   Procedure: TOTAL HIP ARTHROPLASTY ANTERIOR APPROACH;  Surgeon: Claiborne Crew, MD;  Location: [facility] ORS;  Service: Orthopedics;  Laterality: Right;   VESICOVAGINAL FISTULA CLOSURE W/ TAH     Family History Family History  Problem Relation Age of Onset   Asthma Mother    Pancreatitis Mother    Alcohol abuse Father     Social History Social History   Tobacco Use   Smoking status: Never   Smokeless tobacco: Never  Vaping Use   Vaping status: Never Used  Substance Use Topics   Alcohol use: No    Alcohol/week: 0.0 standard drinks of alcohol   Drug use: No   Allergies Byetta 10 mcg pen [exenatide] and Evista [raloxifene]  Review of Systems Review of Systems  Physical Exam Vital Signs  I have reviewed the triage vital signs BP (!) 190/92 (BP Location: Right Arm)   Pulse 88   Temp 98.2 F (36.8 C) (Oral)   Resp 17   Ht 5\' 6"  (1.676 m)    Wt 85.7 kg   SpO2 99%   BMI 30.51 kg/m   Physical Exam Vitals and nursing note reviewed.  Cardiovascular:     Rate and Rhythm: Normal rate.  Pulmonary:  Effort: Pulmonary effort is normal.  Abdominal:     General: Abdomen is flat. There is no distension.     Palpations: Abdomen is soft.     Tenderness: There is no abdominal tenderness. There is no guarding.  Neurological:     General: No focal deficit present.     Mental Status: She is alert.     Motor: No weakness.     Gait: Gait normal.     ED Results and Treatments Labs (all labs ordered are listed, but only abnormal results are displayed) Labs Reviewed  COMPREHENSIVE METABOLIC PANEL WITH GFR - Abnormal; Notable for the following components:      Result Value   Glucose, Bld 204 (*)    BUN 26 (*)    Creatinine, Ser 1.81 (*)    GFR, Estimated 26 (*)    All other components within normal limits  CBC - Abnormal; Notable for the following components:   Hemoglobin 10.4 (*)    HCT 35.4 (*)    MCH 23.5 (*)    MCHC 29.4 (*)    RDW 17.8 (*)    All other components within normal limits  URINALYSIS, ROUTINE W REFLEX MICROSCOPIC - Abnormal; Notable for the following components:   APPearance CLOUDY (*)    Nitrite POSITIVE (*)    Leukocytes,Ua SMALL (*)    All other components within normal limits  URINALYSIS, MICROSCOPIC (REFLEX) - Abnormal; Notable for the following components:   Bacteria, UA MANY (*)    All other components within normal limits  CBG MONITORING, ED - Abnormal; Notable for the following components:   Glucose-Capillary 190 (*)    All other components within normal limits  TSH                                                                                                                          Radiology DG Chest Port 1 View Result Date: 07/05/2023 EXAM: 1 VIEW XRAY OF THE CHEST 07/05/2023 03:59:00 PM COMPARISON: 12/30/2019 CLINICAL HISTORY: Generalized weakness over the last 3 weeks. FINDINGS: LUNGS  AND PLEURA: No focal pulmonary opacity. No pulmonary edema. No pleural effusion. No pneumothorax. HEART AND MEDIASTINUM: Cardiomegaly as stable without evidence of edema or effusion to suggest failure. Atherosclerotic changes are present at the aortic arch. AICD is stable. BONES AND SOFT TISSUES: No acute osseous abnormality. IMPRESSION: 1. No acute findings. 2. Stable cardiomegaly without evidence of edema or effusion to suggest failure. Electronically signed by: Audree Leas MD 07/05/2023 06:08 PM EDT RP Workstation: [id number]    Pertinent labs & imaging results that were available during my care of the patient were reviewed by me and considered in my medical decision making (see MDM for details).  Medications Ordered in ED Medications  cefTRIAXone  (ROCEPHIN ) 1 g in sodium chloride  0.9 % 100 mL IVPB (0 g Intravenous Stopped 07/05/23 1737)  Procedures Procedures  (including critical care time)  Medical Decision Making / ED Course   This patient presents to the ED for concern of generalized weakness, this involves an extensive number of treatment options, and is a complaint that carries with it a high risk of complications and morbidity.  The differential diagnosis includes anemia, underlying infection, cellulitis, consider cystitis, thyroid  dysfunction.  MDM: Patient with reassuring vital signs.  EKG shows a paced rhythm.  Looking to the patient's labs, she has hemoglobin today of 10.4.  The last hemoglobin that I have this patient is from 1 year ago when it was in the 15's.  Patient is on Eliquis .  She states she had a colonoscopy "many years ago."  Denies any dark or bloody stools.  Digital rectal exam, patient did not have any stool in her vault.  The sore in her buttock appears fine and healing well.  No evidence of underlying infection.  Patient does  have a nitrate positive urine.  Will treat with some antibiotics.  Spoke with Dr. Kimble Pennant from GI.  He was amenable to admission.  Will admit to hospitalist.   Reassessment 6:30 PM-was able to see patient's previous labs from October of this past year, hemoglobin was 12.4 at that time.  Patient without any active bleeding.  Certainly could be related to UTI.  She has received a dose of Rocephin  here in the ED.  Will discharge patient with prescription for Keflex .  Patient ambulatory time discharge, overall well-appearing.   Additional history obtained: -Additional history obtained from husband at bedside -External records from outside source obtained and reviewed including: Chart review including previous notes, labs, imaging, consultation notes   Lab Tests: -I ordered, reviewed, and interpreted labs.   The pertinent results include:   Labs Reviewed  COMPREHENSIVE METABOLIC PANEL WITH GFR - Abnormal; Notable for the following components:      Result Value   Glucose, Bld 204 (*)    BUN 26 (*)    Creatinine, Ser 1.81 (*)    GFR, Estimated 26 (*)    All other components within normal limits  CBC - Abnormal; Notable for the following components:   Hemoglobin 10.4 (*)    HCT 35.4 (*)    MCH 23.5 (*)    MCHC 29.4 (*)    RDW 17.8 (*)    All other components within normal limits  URINALYSIS, ROUTINE W REFLEX MICROSCOPIC - Abnormal; Notable for the following components:   APPearance CLOUDY (*)    Nitrite POSITIVE (*)    Leukocytes,Ua SMALL (*)    All other components within normal limits  URINALYSIS, MICROSCOPIC (REFLEX) - Abnormal; Notable for the following components:   Bacteria, UA MANY (*)    All other components within normal limits  CBG MONITORING, ED - Abnormal; Notable for the following components:   Glucose-Capillary 190 (*)    All other components within normal limits  TSH      EKG paced rhythm, no acute ischemia  EKG Interpretation Date/Time:  Friday Jul 05 2023  14:03:20 EDT Ventricular Rate:  65 PR Interval:    QRS Duration:  154 QT Interval:  493 QTC Calculation: 513 R Axis:   -73  Text Interpretation: Junctional rhythm Right bundle branch block LVH with secondary repolarization abnormality Confirmed by Afton Horse [phone]) on 07/05/2023 3:11:02 PM         Imaging Studies ordered: I ordered imaging studies including chest x-ray I independently visualized and interpreted imaging. I agree with the radiologist  interpretation   Medicines ordered and prescription drug management: Meds ordered this encounter  Medications   cefTRIAXone  (ROCEPHIN ) 1 g in sodium chloride  0.9 % 100 mL IVPB    Antibiotic Indication::   UTI   DISCONTD: cephALEXin  (KEFLEX ) 500 MG capsule    Sig: Take 1 capsule (500 mg total) by mouth 4 (four) times daily.    Dispense:  28 capsule    Refill:  0   cephALEXin  (KEFLEX ) 500 MG capsule    Sig: Take 1 capsule (500 mg total) by mouth 3 (three) times daily for 7 days.    Dispense:  21 capsule    Refill:  0    -I have reviewed the patients home medicines and have made adjustments as needed   Cardiac Monitoring: The patient was maintained on a cardiac monitor.  I personally viewed and interpreted the cardiac monitored which showed an underlying rhythm of: Normal sinus rhythm  Social Determinants of Health:  Factors impacting patients care include: Advanced age, atrial fibrillation on Eliquis    Reevaluation: After the interventions noted above, I reevaluated the patient and found that they have :improved  Co morbidities that complicate the patient evaluation  Past Medical History:  Diagnosis Date   Atrial fibrillation (HCC)    DM (diabetes mellitus) (HCC)    Heart failure (HCC)    Hyperlipidemia    Hypertension       Dispostion: Admission     Final Clinical Impression(s) / ED Diagnoses Final diagnoses:  Anemia, unspecified type  Acute cystitis without hematuria     @PCDICTATION @     Afton Horse T, DO 07/05/23 1724    Afton Horse T, DO 07/05/23 1839

## 2023-07-05 NOTE — ED Triage Notes (Addendum)
 BIB husband from home for general weakness, fatigue, falls asleep easily when sit down, dizzy in the am, and increased nervousness. Onset 3 weeks ago. Lives in independent living at [facility]. Alert, NAD, calm, interactive, steady gait. Denies NVD, fever, bleeding. LS scant wheeze noted.

## 2023-07-05 NOTE — ED Notes (Signed)
 Reviewed discharge instructions, medications and follow up with pt. Pt states understanding. Ambulate with Rollator to vehicle with husband

## 2023-07-05 NOTE — Discharge Instructions (Addendum)
 I have sent in a prescription for a medication called Keflex .  This is an antibiotic that you can take 3 times per day for the next 1 week.  You were diagnosed with a urinary tract infection.  Like we discussed, your hemoglobin was a little bit low.  I would recommend you follow-up with your primary care doctor for repeat blood work to make sure that your hemoglobin is not dropping.  When you were in the emergency room, your hemoglobin was 10.4.  They may recommend a referral to a GI doctor.  Return to the emergency department if you lose consciousness or noticed blood in your stool.

## 2023-07-11 ENCOUNTER — Ambulatory Visit: Payer: Medicare Other

## 2023-07-11 ENCOUNTER — Ambulatory Visit: Payer: Self-pay | Admitting: Internal Medicine

## 2023-07-11 DIAGNOSIS — I428 Other cardiomyopathies: Secondary | ICD-10-CM

## 2023-07-11 LAB — CUP PACEART REMOTE DEVICE CHECK
Battery Remaining Longevity: 0 mo
Battery Voltage: 2.59 V
Date Time Interrogation Session: 20250529020017
HighPow Impedance: 62 Ohm
HighPow Impedance: 62 Ohm
Implantable Lead Connection Status: 753985
Implantable Lead Connection Status: 753985
Implantable Lead Connection Status: 753985
Implantable Lead Implant Date: 20170803
Implantable Lead Implant Date: 20170803
Implantable Lead Implant Date: 20170803
Implantable Lead Location: 753858
Implantable Lead Location: 753859
Implantable Lead Location: 753860
Implantable Lead Model: 4296
Implantable Pulse Generator Implant Date: 20170803
Lead Channel Impedance Value: 340 Ohm
Lead Channel Impedance Value: 400 Ohm
Lead Channel Impedance Value: 430 Ohm
Lead Channel Pacing Threshold Amplitude: 0.625 V
Lead Channel Pacing Threshold Amplitude: 1 V
Lead Channel Pacing Threshold Pulse Width: 0.5 ms
Lead Channel Pacing Threshold Pulse Width: 0.5 ms
Lead Channel Sensing Intrinsic Amplitude: 0.2 mV
Lead Channel Sensing Intrinsic Amplitude: 11.7 mV
Lead Channel Setting Pacing Amplitude: 2 V
Lead Channel Setting Pacing Amplitude: 2 V
Lead Channel Setting Pacing Pulse Width: 0.5 ms
Lead Channel Setting Pacing Pulse Width: 0.5 ms
Lead Channel Setting Sensing Sensitivity: 0.5 mV
Pulse Gen Serial Number: 7348146
Zone Setting Status: 755011

## 2023-08-01 NOTE — Progress Notes (Signed)
 Remote ICD transmission.

## 2023-08-01 NOTE — Addendum Note (Signed)
 Addended by: Edra Govern D on: 08/01/2023 05:19 PM   Modules accepted: Orders, Level of Service

## 2023-08-30 NOTE — Progress Notes (Signed)
 Remote ICD transmission.

## 2023-09-11 ENCOUNTER — Telehealth: Payer: Self-pay

## 2023-09-11 NOTE — Telephone Encounter (Signed)
 The pt husband states she is going to get a new monitor. He will call us  to let us  know when they receive the new monitor and send a transmission.

## 2023-09-18 NOTE — Telephone Encounter (Signed)
 Spoke w/ patients husband - patient is scheduled to see Dr. Waddell to discuss upgrade on 9/14.

## 2023-09-18 NOTE — Telephone Encounter (Signed)
 Patient triggered ERI on 07/11/23.   Needs to be seen ASAP to discuss plan for upgrade with gen change.  Sending high priority to [facility] RN for Dr. Waddell, EP scheduling and Dr. Waddell

## 2023-09-18 NOTE — Telephone Encounter (Signed)
 Call x1; lvmtcb, text message sent to patient + MyChart message sent, to get patient in ASAP with Dr. Waddell (this week - 8/7 and 8/8 availability) for her to discuss upgrade on ICD d/t RRT 07/10/23.

## 2023-09-23 ENCOUNTER — Ambulatory Visit

## 2023-09-23 DIAGNOSIS — I428 Other cardiomyopathies: Secondary | ICD-10-CM

## 2023-09-24 LAB — CUP PACEART REMOTE DEVICE CHECK
Battery Remaining Longevity: 0 mo
Battery Voltage: 2.57 V
Date Time Interrogation Session: 20250811214648
HighPow Impedance: 65 Ohm
HighPow Impedance: 65 Ohm
Implantable Lead Connection Status: 753985
Implantable Lead Connection Status: 753985
Implantable Lead Connection Status: 753985
Implantable Lead Implant Date: 20170803
Implantable Lead Implant Date: 20170803
Implantable Lead Implant Date: 20170803
Implantable Lead Location: 753858
Implantable Lead Location: 753859
Implantable Lead Location: 753860
Implantable Lead Model: 4296
Implantable Pulse Generator Implant Date: 20170803
Lead Channel Impedance Value: 380 Ohm
Lead Channel Impedance Value: 400 Ohm
Lead Channel Impedance Value: 550 Ohm
Lead Channel Pacing Threshold Amplitude: 0.625 V
Lead Channel Pacing Threshold Amplitude: 0.75 V
Lead Channel Pacing Threshold Pulse Width: 0.5 ms
Lead Channel Pacing Threshold Pulse Width: 0.5 ms
Lead Channel Sensing Intrinsic Amplitude: 0.2 mV
Lead Channel Sensing Intrinsic Amplitude: 11.7 mV
Lead Channel Setting Pacing Amplitude: 2 V
Lead Channel Setting Pacing Amplitude: 2 V
Lead Channel Setting Pacing Pulse Width: 0.5 ms
Lead Channel Setting Pacing Pulse Width: 0.5 ms
Lead Channel Setting Sensing Sensitivity: 0.5 mV
Pulse Gen Serial Number: 7348146
Zone Setting Status: 755011

## 2023-09-25 ENCOUNTER — Ambulatory Visit: Payer: Self-pay | Admitting: Internal Medicine

## 2023-09-26 ENCOUNTER — Encounter: Payer: Self-pay | Admitting: Internal Medicine

## 2023-09-26 ENCOUNTER — Ambulatory Visit: Attending: Internal Medicine | Admitting: Internal Medicine

## 2023-09-26 VITALS — BP 119/72 | HR 65 | Ht 66.0 in | Wt 173.0 lb

## 2023-09-26 DIAGNOSIS — E1122 Type 2 diabetes mellitus with diabetic chronic kidney disease: Secondary | ICD-10-CM | POA: Diagnosis present

## 2023-09-26 DIAGNOSIS — I5022 Chronic systolic (congestive) heart failure: Secondary | ICD-10-CM | POA: Insufficient documentation

## 2023-09-26 DIAGNOSIS — I480 Paroxysmal atrial fibrillation: Secondary | ICD-10-CM | POA: Insufficient documentation

## 2023-09-26 DIAGNOSIS — Z794 Long term (current) use of insulin: Secondary | ICD-10-CM | POA: Insufficient documentation

## 2023-09-26 DIAGNOSIS — Z01812 Encounter for preprocedural laboratory examination: Secondary | ICD-10-CM | POA: Insufficient documentation

## 2023-09-26 DIAGNOSIS — N184 Chronic kidney disease, stage 4 (severe): Secondary | ICD-10-CM | POA: Diagnosis present

## 2023-09-26 NOTE — Progress Notes (Signed)
 HPI [name] returns today after a long absence from our EP clinic. She has CHB, and LV dysfunction and underwent BIv ICD insertion about 7 years ago. The patient has developed atrial fib and underwent DCCV but has had return of atrial fib. She could not tell that she had gone back to atrial fib. She does not have palpitations. She is able to perform activities of daily living. She has reached ERI on her device.  Allergies  Allergen Reactions   Byetta 10 Mcg Pen [Exenatide] Nausea Only    Dizziness   Evista [Raloxifene] Other (See Comments)    hot flashes     Current Outpatient Medications  Medication Sig Dispense Refill   apixaban  (ELIQUIS ) 2.5 MG TABS tablet TAKE 1 TABLET(2.5 MG) BY MOUTH TWICE DAILY 180 tablet 1   atorvastatin  (LIPITOR) 20 MG tablet Take 20 mg by mouth in the morning.     carvedilol  (COREG ) 12.5 MG tablet TAKE 1 TABLET(12.5 MG) BY MOUTH TWICE DAILY 180 tablet 3   Cholecalciferol (VITAMIN D3) 25 MCG (1000 UT) CAPS Take 1 capsule by mouth daily.     diphenhydramine-acetaminophen  (TYLENOL  PM) 25-500 MG TABS tablet Take 2 tablets by mouth at bedtime.     ezetimibe  (ZETIA ) 10 MG tablet Take 5 mg by mouth in the morning. Take one-half tablet (5 mg) by mouth daily     furosemide  (LASIX ) 40 MG tablet Take 1 tablet (40 mg total) by mouth daily. 90 tablet 0   glucose blood (ONETOUCH VERIO) test strip USE TO TEST BLOOD SUGAR TWICE DAILY     insulin  glargine, 1 Unit Dial, (TOUJEO  SOLOSTAR) 300 UNIT/ML Solostar Pen Inject 15 Units into the skin at bedtime. (Patient taking differently: Inject 35 Units into the skin at bedtime.) 1.5 pen 0   Insulin  Pen Needle (B-D UF III MINI PEN NEEDLES) 31G X 5 MM MISC Use to inject Toujeo  once daily.     Multiple Vitamins-Minerals (CENTRUM SILVER 50+WOMEN PO) Take 1 tablet by mouth daily.     nateglinide (STARLIX) 60 MG tablet Take 60 mg by mouth 3 (three) times daily.     sitaGLIPtin  (JANUVIA ) 25 MG tablet Take 25 mg by mouth in the  morning.     traMADol  (ULTRAM ) 50 MG tablet Take 1 tablet (50 mg total) by mouth 2 (two) times daily as needed. 60 tablet 2   No current facility-administered medications for this visit.     Past Medical History:  Diagnosis Date   Atrial fibrillation (HCC)    DM (diabetes mellitus) (HCC)    Heart failure (HCC)    Hyperlipidemia    Hypertension     ROS:   All systems reviewed and negative except as noted in the HPI.   Past Surgical History:  Procedure Laterality Date   A-FLUTTER ABLATION N/A 07/30/2016   Procedure: A-Flutter Ablation;  Surgeon: Waddell Danelle ORN, MD;  Location: [facility] INVASIVE CV LAB;  Service: Cardiovascular;  Laterality: N/A;   APPENDECTOMY     BACK SURGERY     CARDIOVERSION N/A 01/26/2022   Procedure: CARDIOVERSION;  Surgeon: Ladona Heinz, MD;  Location: [facility] ENDOSCOPY;  Service: Cardiovascular;  Laterality: N/A;   EP IMPLANTABLE DEVICE N/A 11/11/2015   Procedure: BiVi Upgrade;  Surgeon: Danelle ORN Waddell, MD;  Location: [facility] INVASIVE CV LAB;  Service: Cardiovascular;  Laterality: N/A;   EP IMPLANTABLE DEVICE N/A 09/15/2015   Procedure: BiV ICD Insertion CRT-D;  Surgeon: Will Gladis Norton, MD;  Location: [facility] INVASIVE CV  LAB;  Service: Cardiovascular;  Laterality: N/A;   PARTIAL HYSTERECTOMY     REPLACEMENT TOTAL KNEE     TOTAL HIP ARTHROPLASTY Right 08/25/2019   Procedure: TOTAL HIP ARTHROPLASTY ANTERIOR APPROACH;  Surgeon: Ernie Cough, MD;  Location: [facility] ORS;  Service: Orthopedics;  Laterality: Right;   VESICOVAGINAL FISTULA CLOSURE W/ TAH       Family History  Problem Relation Age of Onset   Asthma Mother    Pancreatitis Mother    Alcohol abuse Father      Social History   Socioeconomic History   Marital status: Married    Spouse name: Not on file   Number of children: 2   Years of education: Not on file   Highest education level: Not on file  Occupational History   Occupation: Retired  Tobacco Use   Smoking status: Never   Smokeless tobacco: Never   Vaping Use   Vaping status: Never Used  Substance and Sexual Activity   Alcohol use: No    Alcohol/week: 0.0 standard drinks of alcohol   Drug use: No   Sexual activity: Never    Birth control/protection: None  Other Topics Concern   Not on file  Social History Narrative   Not on file   Social Drivers of Health   Financial Resource Strain: Low Risk  (04/11/2021)   Received from [facility]   Overall Financial Resource Strain (CARDIA)  Food Insecurity: Low Risk  (07/17/2023)   Received from [facility]   Hunger Vital Sign    Within the past 12 months, you worried that your food would run out before you got money to buy more: Never true    Within the past 12 months, the food you bought just didn't last and you didn't have money to get more. : Never true  Transportation Needs: No Transportation Needs (07/17/2023)   Received from [facility]    In the past 12 months, has lack of reliable transportation kept you from medical appointments, meetings, work or from getting things needed for daily living? : No  Physical Activity: Inactive (04/11/2021)   Received from [facility] visits prior to 04/14/2022., [facility]   Exercise Vital Sign    On average, how many days per week do you engage in moderate to strenuous exercise (like a brisk walk)?: 0 days    On average, how many minutes do you engage in exercise at this level?: 0 min  Stress: No Stress Concern Present (04/11/2021)   Received from [facility] - Occupational Stress Questionnaire  Social Connections: Unknown (04/11/2021)   Received from [facility] visits prior to 04/14/2022., [facility]   Social Connection and Isolation Panel    In a typical week, how many times do you talk on the phone with family, friends, or neighbors?: More than three times a week    How often do you get together with friends or relatives?: More  than three times a week    How often do you attend church or religious services?: Patient declined    Do you belong to any clubs or organizations such as church groups, unions, fraternal or athletic groups, or school groups?: No    How often do you attend meetings of the clubs or organizations you belong to?: Never    Are you married, widowed, divorced, separated, never married, or living with a partner?: Married  Intimate Partner Violence:  Not At Risk (04/11/2021)   Received from [facility] visits prior to 04/14/2022.   Humiliation, Afraid, Rape, and Kick questionnaire    Within the last year, have you been afraid of your partner or ex-partner?: No    Within the last year, have you been humiliated or emotionally abused in other ways by your partner or ex-partner?: No    Within the last year, have you been kicked, hit, slapped, or otherwise physically hurt by your partner or ex-partner?: No    Within the last year, have you been raped or forced to have any kind of sexual activity by your partner or ex-partner?: No     BP 119/72   Pulse 65   Ht 5' 6 (1.676 m)   Wt 173 lb (78.5 kg)   SpO2 96%   BMI 27.92 kg/m   Physical Exam:  Well appearing NAD HEENT: Unremarkable Neck:  No JVD, no thyromegally Lymphatics:  No adenopathy Back:  No CVA tenderness Lungs:  Clear HEART:  Regular rate rhythm, no murmurs, no rubs, no clicks Abd:  soft, positive bowel sounds, no organomegally, no rebound, no guarding Ext:  2 plus pulses, no edema, no cyanosis, no clubbing Skin:  No rashes no nodules Neuro:  CN II through XII intact, motor grossly intact  DEVICE  Normal device function.  See PaceArt for details. ERI on 5/25.  Assess/Plan:  Persistent atrial fib - she has minimal if any symptoms. I have asked her to stop the amiodarone . Rate control. ICD - her device is at [facility]. We will plan gen change out  Might consider downgrade to a biv ppm if her ICD lead is IS-1.  HTN - her  bp is well controlled. No change in meds.    Danelle Alanda Colton,MD

## 2023-09-26 NOTE — Patient Instructions (Addendum)
 Medication Instructions:  Your physician recommends that you continue on your current medications as directed. Please refer to the Current Medication list given to you today.  *If you need a refill on your cardiac medications before your next appointment, please call your pharmacy*  Lab Work: BMET, CBC - Today  You may go to any [facility] Location for your lab work:  [facility] - [address] ([facility]) - [phone] [address]  [place] - [address]   [place]  - [address]   [place] - [address] - [address] [place] [place]  - [address] ([facility]   If you have labs (blood work) drawn today and your tests are completely normal, you will receive your results only by: [employer] (if you have MyChart)  If you have any lab test that is abnormal or we need to change your treatment, we will call you or send a MyChart message to review the results.  Testing/Procedures: None ordered.  Follow-Up: At [facility], you and your health needs are our priority.  As part of our continuing mission to provide you with exceptional heart care, we have created designated Provider Care Teams.  These Care Teams include your primary Cardiologist (physician) and Advanced Practice Providers (APPs -  Physician Assistants and Nurse Practitioners) who all work together to provide you with the care you need, when you need it.  Your next appointment:   To be scheduled

## 2023-09-26 NOTE — H&P (View-Only) (Signed)
 HPI Janet Owen returns today after a long absence from our EP clinic. She has CHB, and LV dysfunction and underwent BIv ICD insertion about 7 years ago. The patient has developed atrial fib and underwent DCCV but has had return of atrial fib. She could not tell that she had gone back to atrial fib. She does not have palpitations. She is able to perform activities of daily living. She has reached ERI on her device.  Allergies  Allergen Reactions   Byetta 10 Mcg Pen [Exenatide] Nausea Only    Dizziness   Evista [Raloxifene] Other (See Comments)    hot flashes     Current Outpatient Medications  Medication Sig Dispense Refill   apixaban  (ELIQUIS ) 2.5 MG TABS tablet TAKE 1 TABLET(2.5 MG) BY MOUTH TWICE DAILY 180 tablet 1   atorvastatin  (LIPITOR) 20 MG tablet Take 20 mg by mouth in the morning.     carvedilol  (COREG ) 12.5 MG tablet TAKE 1 TABLET(12.5 MG) BY MOUTH TWICE DAILY 180 tablet 3   Cholecalciferol (VITAMIN D3) 25 MCG (1000 UT) CAPS Take 1 capsule by mouth daily.     diphenhydramine-acetaminophen  (TYLENOL  PM) 25-500 MG TABS tablet Take 2 tablets by mouth at bedtime.     ezetimibe  (ZETIA ) 10 MG tablet Take 5 mg by mouth in the morning. Take one-half tablet (5 mg) by mouth daily     furosemide  (LASIX ) 40 MG tablet Take 1 tablet (40 mg total) by mouth daily. 90 tablet 0   glucose blood (ONETOUCH VERIO) test strip USE TO TEST BLOOD SUGAR TWICE DAILY     insulin  glargine, 1 Unit Dial, (TOUJEO  SOLOSTAR) 300 UNIT/ML Solostar Pen Inject 15 Units into the skin at bedtime. (Patient taking differently: Inject 35 Units into the skin at bedtime.) 1.5 pen 0   Insulin  Pen Needle (B-D UF III MINI PEN NEEDLES) 31G X 5 MM MISC Use to inject Toujeo  once daily.     Multiple Vitamins-Minerals (CENTRUM SILVER 50+WOMEN PO) Take 1 tablet by mouth daily.     nateglinide (STARLIX) 60 MG tablet Take 60 mg by mouth 3 (three) times daily.     sitaGLIPtin  (JANUVIA ) 25 MG tablet Take 25 mg by mouth in the  morning.     traMADol  (ULTRAM ) 50 MG tablet Take 1 tablet (50 mg total) by mouth 2 (two) times daily as needed. 60 tablet 2   No current facility-administered medications for this visit.     Past Medical History:  Diagnosis Date   Atrial fibrillation (HCC)    DM (diabetes mellitus) (HCC)    Heart failure (HCC)    Hyperlipidemia    Hypertension     ROS:   All systems reviewed and negative except as noted in the HPI.   Past Surgical History:  Procedure Laterality Date   A-FLUTTER ABLATION N/A 07/30/2016   Procedure: A-Flutter Ablation;  Surgeon: Waddell Danelle ORN, MD;  Location: [facility] INVASIVE CV LAB;  Service: Cardiovascular;  Laterality: N/A;   APPENDECTOMY     BACK SURGERY     CARDIOVERSION N/A 01/26/2022   Procedure: CARDIOVERSION;  Surgeon: Ladona Heinz, MD;  Location: [facility] ENDOSCOPY;  Service: Cardiovascular;  Laterality: N/A;   EP IMPLANTABLE DEVICE N/A 11/11/2015   Procedure: BiVi Upgrade;  Surgeon: Danelle ORN Waddell, MD;  Location: [facility] INVASIVE CV LAB;  Service: Cardiovascular;  Laterality: N/A;   EP IMPLANTABLE DEVICE N/A 09/15/2015   Procedure: BiV ICD Insertion CRT-D;  Surgeon: Will Gladis Norton, MD;  Location: [facility] INVASIVE CV  LAB;  Service: Cardiovascular;  Laterality: N/A;   PARTIAL HYSTERECTOMY     REPLACEMENT TOTAL KNEE     TOTAL HIP ARTHROPLASTY Right 08/25/2019   Procedure: TOTAL HIP ARTHROPLASTY ANTERIOR APPROACH;  Surgeon: Ernie Cough, MD;  Location: [facility] ORS;  Service: Orthopedics;  Laterality: Right;   VESICOVAGINAL FISTULA CLOSURE W/ TAH       Family History  Problem Relation Age of Onset   Asthma Mother    Pancreatitis Mother    Alcohol abuse Father      Social History   Socioeconomic History   Marital status: Married    Spouse name: Not on file   Number of children: 2   Years of education: Not on file   Highest education level: Not on file  Occupational History   Occupation: Retired  Tobacco Use   Smoking status: Never   Smokeless tobacco: Never   Vaping Use   Vaping status: Never Used  Substance and Sexual Activity   Alcohol use: No    Alcohol/week: 0.0 standard drinks of alcohol   Drug use: No   Sexual activity: Never    Birth control/protection: None  Other Topics Concern   Not on file  Social History Narrative   Not on file   Social Drivers of Health   Financial Resource Strain: Low Risk  (04/11/2021)   Received from [facility]   Overall Financial Resource Strain (CARDIA)  Food Insecurity: Low Risk  (07/17/2023)   Received from [facility]   Hunger Vital Sign    Within the past 12 months, you worried that your food would run out before you got money to buy more: Never true    Within the past 12 months, the food you bought just didn't last and you didn't have money to get more. : Never true  Transportation Needs: No Transportation Needs (07/17/2023)   Received from [facility]    In the past 12 months, has lack of reliable transportation kept you from medical appointments, meetings, work or from getting things needed for daily living? : No  Physical Activity: Inactive (04/11/2021)   Received from [facility] visits prior to 04/14/2022., [facility]   Exercise Vital Sign    On average, how many days per week do you engage in moderate to strenuous exercise (like a brisk walk)?: 0 days    On average, how many minutes do you engage in exercise at this level?: 0 min  Stress: No Stress Concern Present (04/11/2021)   Received from [facility] - Occupational Stress Questionnaire  Social Connections: Unknown (04/11/2021)   Received from [facility] visits prior to 04/14/2022., [facility]   Social Connection and Isolation Panel    In a typical week, how many times do you talk on the phone with family, friends, or neighbors?: More than three times a week    How often do you get together with friends or relatives?: More  than three times a week    How often do you attend church or religious services?: Patient declined    Do you belong to any clubs or organizations such as church groups, unions, fraternal or athletic groups, or school groups?: No    How often do you attend meetings of the clubs or organizations you belong to?: Never    Are you married, widowed, divorced, separated, never married, or living with a partner?: Married  Intimate Partner Violence:  Not At Risk (04/11/2021)   Received from [facility] visits prior to 04/14/2022.   Humiliation, Afraid, Rape, and Kick questionnaire    Within the last year, have you been afraid of your partner or ex-partner?: No    Within the last year, have you been humiliated or emotionally abused in other ways by your partner or ex-partner?: No    Within the last year, have you been kicked, hit, slapped, or otherwise physically hurt by your partner or ex-partner?: No    Within the last year, have you been raped or forced to have any kind of sexual activity by your partner or ex-partner?: No     BP 119/72   Pulse 65   Ht 5' 6 (1.676 m)   Wt 173 lb (78.5 kg)   SpO2 96%   BMI 27.92 kg/m   Physical Exam:  Well appearing NAD HEENT: Unremarkable Neck:  No JVD, no thyromegally Lymphatics:  No adenopathy Back:  No CVA tenderness Lungs:  Clear HEART:  Regular rate rhythm, no murmurs, no rubs, no clicks Abd:  soft, positive bowel sounds, no organomegally, no rebound, no guarding Ext:  2 plus pulses, no edema, no cyanosis, no clubbing Skin:  No rashes no nodules Neuro:  CN II through XII intact, motor grossly intact  DEVICE  Normal device function.  See PaceArt for details. ERI on 5/25.  Assess/Plan:  Persistent atrial fib - she has minimal if any symptoms. I have asked her to stop the amiodarone . Rate control. ICD - her device is at [facility]. We will plan gen change out  Might consider downgrade to a biv ppm if her ICD lead is IS-1.  HTN - her  bp is well controlled. No change in meds.    Danelle Fard Borunda,MD

## 2023-09-27 LAB — CBC
Hematocrit: 33.3 % — ABNORMAL LOW (ref 34.0–46.6)
Hemoglobin: 10.1 g/dL — ABNORMAL LOW (ref 11.1–15.9)
MCH: 27.6 pg (ref 26.6–33.0)
MCHC: 30.3 g/dL — ABNORMAL LOW (ref 31.5–35.7)
MCV: 91 fL (ref 79–97)
Platelets: 240 x10E3/uL (ref 150–450)
RBC: 3.66 x10E6/uL — ABNORMAL LOW (ref 3.77–5.28)
RDW: 16.6 % — ABNORMAL HIGH (ref 11.7–15.4)
WBC: 9.8 x10E3/uL (ref 3.4–10.8)

## 2023-09-27 LAB — BASIC METABOLIC PANEL WITH GFR
BUN/Creatinine Ratio: 18 (ref 12–28)
BUN: 34 mg/dL — ABNORMAL HIGH (ref 8–27)
CO2: 26 mmol/L (ref 20–29)
Calcium: 10.6 mg/dL — ABNORMAL HIGH (ref 8.7–10.3)
Chloride: 98 mmol/L (ref 96–106)
Creatinine, Ser: 1.87 mg/dL — ABNORMAL HIGH (ref 0.57–1.00)
Glucose: 194 mg/dL — ABNORMAL HIGH (ref 70–99)
Potassium: 4.5 mmol/L (ref 3.5–5.2)
Sodium: 140 mmol/L (ref 134–144)
eGFR: 26 mL/min/1.73 — ABNORMAL LOW (ref >59)

## 2023-09-30 NOTE — Pre-Procedure Instructions (Signed)
 Attempted to call patient regarding procedure instructions.  Left voicemail on the following items: Arrival time 0930 Nothing to eat or drink after midnight No meds AM of procedure Responsible person to drive you home and stay with you for 24 hrs Wash with special soap night before and morning of procedure If on anti-coagulant drug instructions Eliquis - last dose 8/16

## 2023-10-01 ENCOUNTER — Ambulatory Visit (HOSPITAL_COMMUNITY)
Admission: RE | Admit: 2023-10-01 | Discharge: 2023-10-01 | Disposition: A | Discharge status: Home / Self Care | Attending: Internal Medicine | Admitting: Internal Medicine

## 2023-10-01 ENCOUNTER — Other Ambulatory Visit: Payer: Self-pay

## 2023-10-01 ENCOUNTER — Ambulatory Visit (HOSPITAL_COMMUNITY)
Admission: RE | Disposition: A | Discharge status: Home / Self Care | Payer: Self-pay | Source: Home / Self Care | Attending: Internal Medicine

## 2023-10-01 DIAGNOSIS — I5022 Chronic systolic (congestive) heart failure: Secondary | ICD-10-CM | POA: Diagnosis not present

## 2023-10-01 DIAGNOSIS — Z4502 Encounter for adjustment and management of automatic implantable cardiac defibrillator: Secondary | ICD-10-CM | POA: Diagnosis present

## 2023-10-01 DIAGNOSIS — I442 Atrioventricular block, complete: Secondary | ICD-10-CM | POA: Diagnosis not present

## 2023-10-01 DIAGNOSIS — I110 Hypertensive heart disease with heart failure: Secondary | ICD-10-CM | POA: Insufficient documentation

## 2023-10-01 HISTORY — PX: BIV ICD GENERATOR CHANGEOUT: EP1194

## 2023-10-01 LAB — GLUCOSE, CAPILLARY: Glucose-Capillary: 162 mg/dL — ABNORMAL HIGH (ref 70–99)

## 2023-10-01 SURGERY — BIV ICD GENERATOR CHANGEOUT

## 2023-10-01 MED ORDER — SODIUM CHLORIDE 0.9 % IV SOLN: INTRAVENOUS | Status: DC

## 2023-10-01 MED ORDER — FENTANYL CITRATE (PF) 100 MCG/2ML IJ SOLN
INTRAMUSCULAR | Status: AC
  Filled 2023-10-01: qty 2

## 2023-10-01 MED ORDER — CEFAZOLIN SODIUM-DEXTROSE 2-4 GM/100ML-% IV SOLN
2.0000 g | INTRAVENOUS | Status: AC
  Administered 2023-10-01: 2 g via INTRAVENOUS

## 2023-10-01 MED ORDER — LIDOCAINE HCL (PF) 1 % IJ SOLN
INTRAMUSCULAR | Status: DC | PRN
  Administered 2023-10-01: 60 mL

## 2023-10-01 MED ORDER — CHLORHEXIDINE GLUCONATE 4 % EX SOLN: 4.0000 | Freq: Once | CUTANEOUS | Status: DC

## 2023-10-01 MED ORDER — GENTAMICIN SULFATE 40 MG/ML IJ SOLN
INTRAMUSCULAR | Status: AC
  Filled 2023-10-01: qty 2

## 2023-10-01 MED ORDER — POVIDONE-IODINE 10 % EX SWAB: 2.0000 | Freq: Once | CUTANEOUS | Status: DC

## 2023-10-01 MED ORDER — SODIUM CHLORIDE 0.9 % IV SOLN
80.0000 mg | INTRAVENOUS | Status: AC
  Administered 2023-10-01: 80 mg

## 2023-10-01 MED ORDER — CEFAZOLIN SODIUM-DEXTROSE 2-4 GM/100ML-% IV SOLN
INTRAVENOUS | Status: AC
  Filled 2023-10-01: qty 100

## 2023-10-01 MED ORDER — MIDAZOLAM HCL 2 MG/2ML IJ SOLN
INTRAMUSCULAR | Status: AC
  Filled 2023-10-01: qty 2

## 2023-10-01 MED ORDER — ACETAMINOPHEN 325 MG PO TABS: 325.0000 mg | ORAL_TABLET | ORAL | Status: DC | PRN

## 2023-10-01 MED ORDER — FENTANYL CITRATE (PF) 100 MCG/2ML IJ SOLN
INTRAMUSCULAR | Status: DC | PRN
  Administered 2023-10-01: 25 ug via INTRAVENOUS

## 2023-10-01 MED ORDER — ONDANSETRON HCL 4 MG/2ML IJ SOLN: 4.0000 mg | Freq: Four times a day (QID) | INTRAMUSCULAR | Status: DC | PRN

## 2023-10-01 MED ORDER — MIDAZOLAM HCL 5 MG/5ML IJ SOLN
INTRAMUSCULAR | Status: DC | PRN
  Administered 2023-10-01: 2 mg via INTRAVENOUS

## 2023-10-01 SURGICAL SUPPLY — 5 items
CABLE SURGICAL S-101-97-12 (CABLE) ×1 IMPLANT
ICD GALLANT HF CRT-D DF-4 IS-1 (ICD Generator) IMPLANT
PAD DEFIB RADIO PHYSIO CONN (PAD) ×1 IMPLANT
POUCH AIGIS-R ANTIBACT ICD LRG (Mesh General) IMPLANT
TRAY PACEMAKER INSERTION (PACKS) ×1 IMPLANT

## 2023-10-01 NOTE — Discharge Instructions (Signed)

## 2023-10-01 NOTE — Interval H&P Note (Signed)
 History and Physical Interval Note:  10/01/2023 5:48 PM  Janet Owen  has presented today for surgery, with the diagnosis of eri.  The various methods of treatment have been discussed with the patient and family. After consideration of risks, benefits and other options for treatment, the patient has consented to  Procedure(s): BIV ICD GENERATOR CHANGEOUT (N/A) as a surgical intervention.  The patient's history has been reviewed, patient examined, no change in status, stable for surgery.  I have reviewed the patient's chart and labs.  Questions were answered to the patient's satisfaction.     Danelle Birmingham

## 2023-10-02 ENCOUNTER — Encounter (HOSPITAL_COMMUNITY): Payer: Self-pay | Admitting: Internal Medicine

## 2023-10-04 MED FILL — Midazolam HCl Inj 2 MG/2ML (Base Equivalent): INTRAMUSCULAR | Qty: 2 | Status: AC

## 2023-10-10 NOTE — Patient Instructions (Signed)
   After Your ICD (Implantable Cardiac Defibrillator)    Monitor your defibrillator site for redness, swelling, and drainage. Call the device clinic at [phone] if you experience these symptoms or fever/chills.  Your incision was closed with Steri-strips or staples:  You may shower 7 days after your procedure and wash your incision with soap and water. Avoid lotions, ointments, or perfumes over your incision until it is well-healed.  You may use a hot tub or a pool after your wound check appointment if the incision is completely closed.   There are no other restrictions in arm movement after your wound check appointment.  Your ICD is designed to protect you from life threatening heart rhythms. Because of this, you may receive a shock.   1 shock with no symptoms:  Call the office during business hours. 1 shock with symptoms (chest pain, chest pressure, dizziness, lightheadedness, shortness of breath, overall feeling unwell):  Call 911. If you experience 2 or more shocks in 24 hours:  Call 911. If you receive a shock, you should not drive.  [place] DMV - no driving for 6 months if you receive appropriate therapy from your ICD.   ICD Alerts:  Some alerts are vibratory and others beep. These are NOT emergencies. Please call our office to let us know. If this occurs at night or on weekends, it can wait until the next business day. Send a remote transmission.  If your device is capable of reading fluid status (for heart failure), you will be offered monthly monitoring to review this with you.   Remote monitoring is used to monitor your ICD from home. This monitoring is scheduled every 91 days by our office. It allows [place] to keep an eye on the functioning of your device to ensure it is working properly. You will routinely see your Electrophysiologist annually (more often if necessary).

## 2023-10-11 ENCOUNTER — Ambulatory Visit: Attending: Cardiology

## 2023-10-11 DIAGNOSIS — I5022 Chronic systolic (congestive) heart failure: Secondary | ICD-10-CM | POA: Diagnosis present

## 2023-10-11 LAB — CUP PACEART INCLINIC DEVICE CHECK
Battery Remaining Longevity: 98 mo
Brady Statistic RA Percent Paced: 0 %
Brady Statistic RV Percent Paced: 83 %
Date Time Interrogation Session: 20250829200314
HighPow Impedance: 61.875
Implantable Lead Connection Status: 753985
Implantable Lead Connection Status: 753985
Implantable Lead Connection Status: 753985
Implantable Lead Implant Date: 20170803
Implantable Lead Implant Date: 20170803
Implantable Lead Implant Date: 20170803
Implantable Lead Location: 753858
Implantable Lead Location: 753859
Implantable Lead Location: 753860
Implantable Lead Model: 4296
Implantable Pulse Generator Implant Date: 20250819
Lead Channel Impedance Value: 387.5 Ohm
Lead Channel Impedance Value: 437.5 Ohm
Lead Channel Impedance Value: 475 Ohm
Lead Channel Pacing Threshold Amplitude: 0.75 V
Lead Channel Pacing Threshold Amplitude: 1 V
Lead Channel Pacing Threshold Pulse Width: 0.5 ms
Lead Channel Pacing Threshold Pulse Width: 0.5 ms
Lead Channel Sensing Intrinsic Amplitude: 0.3 mV
Lead Channel Sensing Intrinsic Amplitude: 9.5 mV
Lead Channel Setting Pacing Amplitude: 1.5 V
Lead Channel Setting Pacing Amplitude: 1.75 V
Lead Channel Setting Pacing Pulse Width: 0.5 ms
Lead Channel Setting Pacing Pulse Width: 0.5 ms
Lead Channel Setting Sensing Sensitivity: 0.3 mV
Pulse Gen Serial Number: 111079565
Zone Setting Status: 755011

## 2023-10-11 NOTE — Progress Notes (Signed)
 Normal CRT-D ICD wound check. Programmed VVIR. Wound well healed. Presenting rhythm: AF/BVP 67 .  BVP percentage: 83%.  Routine testing performed. Thresholds, sensing, and impedance consistent with implant measurements with auto capture on.  Gen change only, chronic leads in place.  No treated arrhythmias.  457 AF episodes, V rates controlled, on OAC.  No arm restrictions. Reviewed shock plan.  Pt enrolled in remote follow-up.  Reviewed low BVP percentage with Bryan, [employer] rep.  The following programming changes made per his recommendations to improve:   PROGRAMMING CHANGES:  1. LRL increased from 60 to 70 per Bryan's recommendations 2.  Slope decreased from 12 to 10 per Bryan's recommendations 3.  I adjusted RA sensitivity set on auto sense from .3 to 0.2 due to undersensing of AF.

## 2023-10-14 ENCOUNTER — Encounter

## 2023-10-24 ENCOUNTER — Encounter

## 2023-10-25 ENCOUNTER — Encounter

## 2023-10-27 ENCOUNTER — Other Ambulatory Visit: Payer: Self-pay | Admitting: Cardiology

## 2023-10-27 DIAGNOSIS — I480 Paroxysmal atrial fibrillation: Secondary | ICD-10-CM

## 2023-10-28 NOTE — Telephone Encounter (Signed)
 Prescription refill request for Eliquis  received. Indication:a fib Last office visit: 09/26/23 Scr: 1.87 epic 09/26/23 Age: 89 Weight: 78kg

## 2023-11-08 NOTE — Progress Notes (Signed)
Remote ICD Transmission.

## 2023-11-12 ENCOUNTER — Ambulatory Visit

## 2023-11-12 DIAGNOSIS — I480 Paroxysmal atrial fibrillation: Secondary | ICD-10-CM | POA: Diagnosis not present

## 2023-11-14 ENCOUNTER — Encounter

## 2023-11-14 LAB — CUP PACEART REMOTE DEVICE CHECK
Battery Remaining Longevity: 96 mo
Battery Remaining Percentage: 95 %
Battery Voltage: 3.05 V
Date Time Interrogation Session: 20250930021049
HighPow Impedance: 65 Ohm
Implantable Lead Connection Status: 753985
Implantable Lead Connection Status: 753985
Implantable Lead Connection Status: 753985
Implantable Lead Implant Date: 20170803
Implantable Lead Implant Date: 20170803
Implantable Lead Implant Date: 20170803
Implantable Lead Location: 753858
Implantable Lead Location: 753859
Implantable Lead Location: 753860
Implantable Lead Model: 4296
Implantable Pulse Generator Implant Date: 20250819
Lead Channel Impedance Value: 410 Ohm
Lead Channel Impedance Value: 480 Ohm
Lead Channel Impedance Value: 580 Ohm
Lead Channel Pacing Threshold Amplitude: 0.75 V
Lead Channel Pacing Threshold Amplitude: 1.125 V
Lead Channel Pacing Threshold Pulse Width: 0.5 ms
Lead Channel Pacing Threshold Pulse Width: 0.5 ms
Lead Channel Sensing Intrinsic Amplitude: 0.3 mV
Lead Channel Sensing Intrinsic Amplitude: 9.8 mV
Lead Channel Setting Pacing Amplitude: 1.625
Lead Channel Setting Pacing Amplitude: 1.75 V
Lead Channel Setting Pacing Pulse Width: 0.5 ms
Lead Channel Setting Pacing Pulse Width: 0.5 ms
Lead Channel Setting Sensing Sensitivity: 0.3 mV
Pulse Gen Serial Number: 111079565
Zone Setting Status: 755011

## 2023-11-17 ENCOUNTER — Ambulatory Visit: Payer: Self-pay | Admitting: Internal Medicine

## 2023-11-18 NOTE — Progress Notes (Signed)
 Remote ICD Transmission

## 2023-11-25 ENCOUNTER — Encounter

## 2023-11-28 ENCOUNTER — Other Ambulatory Visit: Payer: Self-pay

## 2023-11-28 ENCOUNTER — Ambulatory Visit (INDEPENDENT_AMBULATORY_CARE_PROVIDER_SITE_OTHER): Admitting: Physician Assistant

## 2023-11-28 ENCOUNTER — Other Ambulatory Visit (INDEPENDENT_AMBULATORY_CARE_PROVIDER_SITE_OTHER): Payer: Self-pay

## 2023-11-28 DIAGNOSIS — M79604 Pain in right leg: Secondary | ICD-10-CM

## 2023-11-28 MED ORDER — TRAMADOL HCL 50 MG PO TABS: 50.0000 mg | ORAL_TABLET | Freq: Two times a day (BID) | ORAL | 2 refills | Status: AC | PRN

## 2023-11-28 MED ORDER — METHYLPREDNISOLONE 4 MG PO TBPK: ORAL_TABLET | ORAL | 0 refills | Status: AC

## 2023-11-28 NOTE — Progress Notes (Signed)
 Office Visit Note   Patient: Janet Owen           Date of Birth: 11-20-1933           MRN: [id number] Visit Date: 11/28/2023              Requested by: Feliciano Devoria LABOR, MD [address] [place],  [place] [address] PCP: Feliciano Devoria LABOR, MD   Assessment & Plan: Visit Diagnoses:  1. Pain in right leg     Plan: Impression is right leg pain and subjective weakness.  At this point, I feel her symptoms may be from overuse while recovering from her left distal femur ORIF as she already has evidence of advanced lumbar pathology based on x-rays today.  We have discussed starting her on a Medrol  Dosepak and initiating lumbar exercises and her current PT regimen.  Prescription was provided to the patient for this.  If she continues to have back pain and right leg weakness, she will follow-up with Megan Williams for further evaluation and treatment recommendation.  Otherwise, follow-up with us  as needed.  Follow-Up Instructions: Return if symptoms worsen or fail to improve.   Orders:  Orders Placed This Encounter  Procedures   XR Lumbar Spine 2-3 Views   XR KNEE 3 VIEW RIGHT   Meds ordered this encounter  Medications   methylPREDNISolone  (MEDROL  DOSEPAK) 4 MG TBPK tablet    Sig: Take as directed on the package    Dispense:  21 tablet    Refill:  0   traMADol  (ULTRAM ) 50 MG tablet    Sig: Take 1 tablet (50 mg total) by mouth 2 (two) times daily as needed.    Dispense:  60 tablet    Refill:  2      Procedures: No procedures performed   Clinical Data: No additional findings.   Subjective: Chief Complaint  Patient presents with   Left Leg - Pain    HPI patient is a pleasant 89 year old female who comes in today with concerns about her right leg.  She has been working in rehab 3 times a week following left distal femur ORIF by Dr. Jenna back in June of this year and has began having pain and weakness trying to lift her right leg specifically when she is trying  to get it into the bed for the past 3 to 4 weeks.  She does note that she sustained a fall about 6 weeks ago.  She does note some pain to the medial thigh as well as into the knee.  She has been taking tramadol  at night as needed for this.  Of note, she is status post right total knee replacement 20 to 30 years ago and status post right total hip replacement.  Review of Systems as detailed in HPI.  All others reviewed and are negative.   Objective: Vital Signs: There were no vitals taken for this visit.  Physical Exam well-developed well-nourished female in no acute distress.  Alert and oriented x 3.  Ortho Exam right hip exam: No pain with logroll or FADIR testing.  Right knee exam: No effusion.  She has a fully healed surgical scar without complication.  No signs of infection or cellulitis.  Range of motion 0 to 100 degrees.  She does have medial and lateral joint line tenderness.  She is stable to valgus varus stress.  Negative straight leg raise.  No spinous or paraspinous tenderness.  She has no pain with lumbar flexion or  extension.  No focal weakness.  She is neurovascular intact distally.  Specialty Comments:  No specialty comments available.  Imaging: XR Lumbar Spine 2-3 Views Result Date: 11/28/2023 Advanced multilevel degenerative changes  XR KNEE 3 VIEW RIGHT Result Date: 11/28/2023 Well-seated prosthesis.  No acute fracture or any other acute or structural abnormalities    PMFS History: Patient Active Problem List   Diagnosis Date Noted   Persistent atrial fibrillation (HCC) 01/26/2022   UTI (urinary tract infection) 12/30/2019   Hip fracture (HCC) 08/24/2019   PAF (paroxysmal atrial fibrillation) (HCC) 08/24/2019   DNR (do not resuscitate) 08/24/2019   ICD BV  09/15/2015:  [employer] model [id number] (serial  Number [id number]) in situ 07/23/2018   Encounter for management of biventricular implantable cardioverter-defibrillator (ICD) 07/23/2018   Type 2  diabetes mellitus with hyperglycemia (HCC) 08/31/2016   CKD (chronic kidney disease) stage 3, GFR 30-59 ml/min 08/14/2016   Mixed dyslipidemia 08/14/2016   Vitamin D insufficiency 08/14/2016   Chronic systolic heart failure (HCC) 11/11/2015   Chronic systolic congestive heart failure (HCC)    Dyspnea 08/20/2014   Abnormal CT scan, chest 08/20/2014   Essential (primary) hypertension 08/20/2014   Abnormal computed tomography scan 08/20/2014   Encounter to establish care 08/09/2014   Past Medical History:  Diagnosis Date   Atrial fibrillation (HCC)    DM (diabetes mellitus) (HCC)    Heart failure (HCC)    Hyperlipidemia    Hypertension     Family History  Problem Relation Age of Onset   Asthma Mother    Pancreatitis Mother    Alcohol abuse Father     Past Surgical History:  Procedure Laterality Date   A-FLUTTER ABLATION N/A 07/30/2016   Procedure: A-Flutter Ablation;  Surgeon: Waddell Danelle ORN, MD;  Location: [facility] INVASIVE CV LAB;  Service: Cardiovascular;  Laterality: N/A;   APPENDECTOMY     BACK SURGERY     BIV ICD GENERATOR CHANGEOUT N/A 10/01/2023   Procedure: BIV ICD GENERATOR CHANGEOUT;  Surgeon: Waddell Danelle ORN, MD;  Location: [facility] INVASIVE CV LAB;  Service: Cardiovascular;  Laterality: N/A;   CARDIOVERSION N/A 01/26/2022   Procedure: CARDIOVERSION;  Surgeon: Ladona Heinz, MD;  Location: [facility] ENDOSCOPY;  Service: Cardiovascular;  Laterality: N/A;   EP IMPLANTABLE DEVICE N/A 11/11/2015   Procedure: BiVi Upgrade;  Surgeon: Danelle ORN Waddell, MD;  Location: [facility] INVASIVE CV LAB;  Service: Cardiovascular;  Laterality: N/A;   EP IMPLANTABLE DEVICE N/A 09/15/2015   Procedure: BiV ICD Insertion CRT-D;  Surgeon: Will Gladis Norton, MD;  Location: [facility] INVASIVE CV LAB;  Service: Cardiovascular;  Laterality: N/A;   PARTIAL HYSTERECTOMY     REPLACEMENT TOTAL KNEE     TOTAL HIP ARTHROPLASTY Right 08/25/2019   Procedure: TOTAL HIP ARTHROPLASTY ANTERIOR APPROACH;  Surgeon: Ernie Cough, MD;  Location:  [facility] ORS;  Service: Orthopedics;  Laterality: Right;   VESICOVAGINAL FISTULA CLOSURE W/ TAH     Social History   Occupational History   Occupation: Retired  Tobacco Use   Smoking status: Never   Smokeless tobacco: Never  Vaping Use   Vaping status: Never Used  Substance and Sexual Activity   Alcohol use: No    Alcohol/week: 0.0 standard drinks of alcohol   Drug use: No   Sexual activity: Never    Birth control/protection: None

## 2023-12-16 ENCOUNTER — Encounter

## 2023-12-16 ENCOUNTER — Encounter: Payer: Self-pay | Admitting: Radiology

## 2023-12-25 ENCOUNTER — Encounter

## 2023-12-26 ENCOUNTER — Encounter

## 2024-01-06 ENCOUNTER — Encounter: Payer: Self-pay | Admitting: Internal Medicine

## 2024-01-06 ENCOUNTER — Ambulatory Visit: Attending: Internal Medicine | Admitting: Internal Medicine

## 2024-01-06 VITALS — BP 129/64 | HR 74 | Ht 66.0 in | Wt 175.0 lb

## 2024-01-06 DIAGNOSIS — I4819 Other persistent atrial fibrillation: Secondary | ICD-10-CM | POA: Insufficient documentation

## 2024-01-06 LAB — CUP PACEART INCLINIC DEVICE CHECK
Battery Remaining Longevity: 92 mo
Brady Statistic RA Percent Paced: 0 %
Brady Statistic RV Percent Paced: 99.25 %
Date Time Interrogation Session: 20251124152914
HighPow Impedance: 67.5 Ohm
Implantable Lead Connection Status: 753985
Implantable Lead Connection Status: 753985
Implantable Lead Connection Status: 753985
Implantable Lead Implant Date: 20170803
Implantable Lead Implant Date: 20170803
Implantable Lead Implant Date: 20170803
Implantable Lead Location: 753858
Implantable Lead Location: 753859
Implantable Lead Location: 753860
Implantable Lead Model: 4296
Implantable Pulse Generator Implant Date: 20250819
Lead Channel Impedance Value: 412.5 Ohm
Lead Channel Impedance Value: 475 Ohm
Lead Channel Impedance Value: 550 Ohm
Lead Channel Pacing Threshold Amplitude: 0.75 V
Lead Channel Pacing Threshold Amplitude: 0.875 V
Lead Channel Pacing Threshold Pulse Width: 0.5 ms
Lead Channel Pacing Threshold Pulse Width: 0.5 ms
Lead Channel Sensing Intrinsic Amplitude: 0.3 mV
Lead Channel Sensing Intrinsic Amplitude: 11.7 mV
Lead Channel Setting Pacing Amplitude: 1.375
Lead Channel Setting Pacing Amplitude: 1.75 V
Lead Channel Setting Pacing Pulse Width: 0.5 ms
Lead Channel Setting Pacing Pulse Width: 0.5 ms
Lead Channel Setting Sensing Sensitivity: 0.3 mV
Pulse Gen Serial Number: 111079565
Zone Setting Status: 755011

## 2024-01-06 NOTE — Progress Notes (Signed)
 HPI Janet Owen returns today for followup after ICD gen change out 3 months ago. She has CHB, and LV dysfunction and underwent BIv ICD insertion about 8 years ago initially.The patient has developed atrial fib and underwent DCCV but has had return of atrial fib. She could not tell that she had gone back to atrial fib. She does not have palpitations. She is able to perform activities of daily living. She has begun to use a walker as she has a propensity to fall.  Allergies  Allergen Reactions   Byetta 10 Mcg Pen [Exenatide] Nausea Only    Dizziness   Evista [Raloxifene] Other (See Comments)    hot flashes     Current Outpatient Medications  Medication Sig Dispense Refill   acetaminophen  (TYLENOL ) 500 MG tablet Take 500-1,000 mg by mouth every 6 (six) hours as needed for mild pain (pain score 1-3) or moderate pain (pain score 4-6).     atorvastatin  (LIPITOR) 40 MG tablet Take 40 mg by mouth daily.     Calcium  Carbonate Antacid (CALCIUM  CARBONATE PO) Take 500 mg by mouth daily.     carvedilol  (COREG ) 12.5 MG tablet TAKE 1 TABLET(12.5 MG) BY MOUTH TWICE DAILY 180 tablet 3   Cholecalciferol (VITAMIN D3) 25 MCG (1000 UT) CAPS Take 1,000 Units by mouth daily.     diphenhydramine-acetaminophen  (TYLENOL  PM) 25-500 MG TABS tablet Take 2 tablets by mouth at bedtime.     docusate sodium  (COLACE) 100 MG capsule Take 100 mg by mouth daily as needed for mild constipation.     ELIQUIS  2.5 MG TABS tablet TAKE 1 TABLET(2.5 MG) BY MOUTH TWICE DAILY 180 tablet 1   ezetimibe  (ZETIA ) 10 MG tablet Take 5 mg by mouth in the morning.     ferrous sulfate  325 (65 FE) MG tablet Take 325 mg by mouth 3 (three) times a week.     furosemide  (LASIX ) 40 MG tablet Take 1 tablet (40 mg total) by mouth daily. 90 tablet 0   glucose blood (ONETOUCH VERIO) test strip USE TO TEST BLOOD SUGAR TWICE DAILY     insulin  glargine, 1 Unit Dial, (TOUJEO  SOLOSTAR) 300 UNIT/ML Solostar Pen Inject 15 Units into the skin at  bedtime. (Patient taking differently: Inject 30 Units into the skin at bedtime.) 1.5 pen 0   Insulin  Pen Needle (B-D UF III MINI PEN NEEDLES) 31G X 5 MM MISC Use to inject Toujeo  once daily.     nateglinide (STARLIX) 60 MG tablet Take 60 mg by mouth 3 (three) times daily.     PAXIL 20 MG tablet Take 20 mg by mouth daily.     sitaGLIPtin  (JANUVIA ) 25 MG tablet Take 25 mg by mouth in the morning.     traMADol  (ULTRAM ) 50 MG tablet Take 1 tablet (50 mg total) by mouth 2 (two) times daily as needed. 60 tablet 2   DESITIN 40 % PSTE Apply 1 Application topically daily as needed (Rash).     methylPREDNISolone  (MEDROL  DOSEPAK) 4 MG TBPK tablet Take as directed on the package 21 tablet 0   Multiple Vitamins-Minerals (CENTRUM SILVER 50+WOMEN PO) Take 1 tablet by mouth daily.     No current facility-administered medications for this visit.     Past Medical History:  Diagnosis Date   Atrial fibrillation (HCC)    DM (diabetes mellitus) (HCC)    Heart failure (HCC)    Hyperlipidemia    Hypertension     ROS:   All systems reviewed  and negative except as noted in the HPI.   Past Surgical History:  Procedure Laterality Date   A-FLUTTER ABLATION N/A 07/30/2016   Procedure: A-Flutter Ablation;  Surgeon: Waddell Danelle ORN, MD;  Location: [facility] INVASIVE CV LAB;  Service: Cardiovascular;  Laterality: N/A;   APPENDECTOMY     BACK SURGERY     BIV ICD GENERATOR CHANGEOUT N/A 10/01/2023   Procedure: BIV ICD GENERATOR CHANGEOUT;  Surgeon: Waddell Danelle ORN, MD;  Location: [facility] INVASIVE CV LAB;  Service: Cardiovascular;  Laterality: N/A;   CARDIOVERSION N/A 01/26/2022   Procedure: CARDIOVERSION;  Surgeon: Ladona Heinz, MD;  Location: [facility] ENDOSCOPY;  Service: Cardiovascular;  Laterality: N/A;   EP IMPLANTABLE DEVICE N/A 11/11/2015   Procedure: BiVi Upgrade;  Surgeon: Danelle ORN Waddell, MD;  Location: [facility] INVASIVE CV LAB;  Service: Cardiovascular;  Laterality: N/A;   EP IMPLANTABLE DEVICE N/A 09/15/2015   Procedure: BiV ICD  Insertion CRT-D;  Surgeon: Will Gladis Norton, MD;  Location: [facility] INVASIVE CV LAB;  Service: Cardiovascular;  Laterality: N/A;   PARTIAL HYSTERECTOMY     REPLACEMENT TOTAL KNEE     TOTAL HIP ARTHROPLASTY Right 08/25/2019   Procedure: TOTAL HIP ARTHROPLASTY ANTERIOR APPROACH;  Surgeon: Ernie Cough, MD;  Location: [facility] ORS;  Service: Orthopedics;  Laterality: Right;   VESICOVAGINAL FISTULA CLOSURE W/ TAH       Family History  Problem Relation Age of Onset   Asthma Mother    Pancreatitis Mother    Alcohol abuse Father      Social History   Socioeconomic History   Marital status: Married    Spouse name: Not on file   Number of children: 2   Years of education: Not on file   Highest education level: Not on file  Occupational History   Occupation: Retired  Tobacco Use   Smoking status: Never   Smokeless tobacco: Never  Vaping Use   Vaping status: Never Used  Substance and Sexual Activity   Alcohol use: No    Alcohol/week: 0.0 standard drinks of alcohol   Drug use: No   Sexual activity: Never    Birth control/protection: None  Other Topics Concern   Not on file  Social History Narrative   Not on file   Social Drivers of Health   Financial Resource Strain: Low Risk  (04/11/2021)   Received from [facility]   Overall Financial Resource Strain (CARDIA)  Food Insecurity: Low Risk  (07/17/2023)   Received from [facility]   Hunger Vital Sign    Within the past 12 months, you worried that your food would run out before you got money to buy more: Never true    Within the past 12 months, the food you bought just didn't last and you didn't have money to get more. : Never true  Transportation Needs: No Transportation Needs (07/17/2023)   Received from [facility]    In the past 12 months, has lack of reliable transportation kept you from medical appointments, meetings, work or from getting things needed for daily living? : No  Physical Activity: Inactive  (04/11/2021)   Received from [facility] visits prior to 04/14/2022., [facility]   Exercise Vital Sign    On average, how many days per week do you engage in moderate to strenuous exercise (like a brisk walk)?: 0 days    On average, how many minutes do you engage in exercise at this level?: 0 min  Stress: No Stress Concern  Present (04/11/2021)   Received from [facility] - Occupational Stress Questionnaire  Social Connections: Unknown (04/11/2021)   Received from [facility] visits prior to 04/14/2022., [facility]   Social Connection and Isolation Panel    In a typical week, how many times do you talk on the phone with family, friends, or neighbors?: More than three times a week    How often do you get together with friends or relatives?: More than three times a week    How often do you attend church or religious services?: Patient declined    Do you belong to any clubs or organizations such as church groups, unions, fraternal or athletic groups, or school groups?: No    How often do you attend meetings of the clubs or organizations you belong to?: Never    Are you married, widowed, divorced, separated, never married, or living with a partner?: Married  Intimate Partner Violence: Not At Risk (04/11/2021)   Received from [facility] visits prior to 04/14/2022.   Humiliation, Afraid, Rape, and Kick questionnaire    Within the last year, have you been afraid of your partner or ex-partner?: No    Within the last year, have you been humiliated or emotionally abused in other ways by your partner or ex-partner?: No    Within the last year, have you been kicked, hit, slapped, or otherwise physically hurt by your partner or ex-partner?: No    Within the last year, have you been raped or forced to have any kind of sexual activity by your partner or ex-partner?: No     BP 129/64   Pulse 74    Ht 5' 6 (1.676 m)   Wt 175 lb (79.4 kg)   SpO2 99%   BMI 28.25 kg/m   Physical Exam:  Well appearing NAD HEENT: Unremarkable Neck:  No JVD, no thyromegally Lymphatics:  No adenopathy Back:  No CVA tenderness Lungs:  Clear with no wheezes HEART:  Regular rate rhythm, no murmurs, no rubs, no clicks Abd:  soft, positive bowel sounds, no organomegally, no rebound, no guarding Ext:  2 plus pulses, no edema, no cyanosis, no clubbing Skin:  No rashes no nodules Neuro:  CN II through XII intact, motor grossly intact  DEVICE  Normal device function.  See PaceArt for details.   Assess/Plan:  Persistent atrial fib - she has minimal if any symptoms.she will continue a strategy of rate control. ]ICD - her device is working normally. We will recheck in several months. HTN - her bp is well controlled. No change in meds.    Danelle Deven Audi,MD

## 2024-01-06 NOTE — Patient Instructions (Signed)

## 2024-01-27 ENCOUNTER — Encounter

## 2024-02-11 ENCOUNTER — Ambulatory Visit (INDEPENDENT_AMBULATORY_CARE_PROVIDER_SITE_OTHER)

## 2024-02-11 DIAGNOSIS — I480 Paroxysmal atrial fibrillation: Secondary | ICD-10-CM

## 2024-02-11 LAB — CUP PACEART REMOTE DEVICE CHECK
Battery Remaining Longevity: 94 mo
Battery Remaining Percentage: 93 %
Battery Voltage: 3.01 V
Date Time Interrogation Session: 20251230020334
HighPow Impedance: 71 Ohm
Lead Channel Impedance Value: 410 Ohm
Lead Channel Impedance Value: 490 Ohm
Lead Channel Impedance Value: 560 Ohm
Lead Channel Pacing Threshold Amplitude: 0.625 V
Lead Channel Pacing Threshold Amplitude: 0.875 V
Lead Channel Pacing Threshold Pulse Width: 0.5 ms
Lead Channel Pacing Threshold Pulse Width: 0.5 ms
Lead Channel Sensing Intrinsic Amplitude: 0.3 mV
Lead Channel Sensing Intrinsic Amplitude: 9.2 mV
Lead Channel Setting Pacing Amplitude: 1.375
Lead Channel Setting Pacing Amplitude: 1.625
Lead Channel Setting Pacing Pulse Width: 0.5 ms
Lead Channel Setting Pacing Pulse Width: 0.5 ms
Lead Channel Setting Sensing Sensitivity: 0.3 mV
Pulse Gen Serial Number: 111079565
Zone Setting Status: 755011

## 2024-02-12 NOTE — Progress Notes (Signed)
 Remote ICD Transmission

## 2024-02-15 ENCOUNTER — Ambulatory Visit: Payer: Self-pay | Admitting: Cardiology

## 2024-02-27 ENCOUNTER — Encounter

## 2024-02-28 ENCOUNTER — Encounter (INDEPENDENT_AMBULATORY_CARE_PROVIDER_SITE_OTHER): Payer: Self-pay

## 2024-03-25 ENCOUNTER — Encounter

## 2024-05-12 ENCOUNTER — Encounter

## 2024-06-24 ENCOUNTER — Encounter

## 2024-08-11 ENCOUNTER — Encounter

## 2024-09-23 ENCOUNTER — Encounter

## 2024-11-10 ENCOUNTER — Encounter

## 2024-12-23 ENCOUNTER — Encounter
